# Patient Record
Sex: Female | Born: 1965 | Race: White | Hispanic: No | Marital: Single | Smoking: Never smoker
Health system: Southern US, Community
[De-identification: ages and names within clinical notes are randomized; demographics above are authoritative.]

## PROBLEM LIST (undated history)

## (undated) ENCOUNTER — Encounter

## (undated) ENCOUNTER — Ambulatory Visit: Payer: PRIVATE HEALTH INSURANCE | Attending: Clinical | Primary: Clinical

## (undated) ENCOUNTER — Telehealth

## (undated) ENCOUNTER — Ambulatory Visit

## (undated) ENCOUNTER — Encounter
Attending: Student in an Organized Health Care Education/Training Program | Primary: Student in an Organized Health Care Education/Training Program

## (undated) ENCOUNTER — Ambulatory Visit: Payer: PRIVATE HEALTH INSURANCE

## (undated) ENCOUNTER — Telehealth: Attending: Clinical | Primary: Clinical

## (undated) ENCOUNTER — Encounter
Attending: Rehabilitative and Restorative Service Providers" | Primary: Rehabilitative and Restorative Service Providers"

## (undated) ENCOUNTER — Encounter: Payer: PRIVATE HEALTH INSURANCE | Attending: Rheumatology | Primary: Rheumatology

## (undated) ENCOUNTER — Ambulatory Visit: Attending: Pharmacist | Primary: Pharmacist

## (undated) ENCOUNTER — Encounter
Attending: Pharmacist Clinician (PhC)/ Clinical Pharmacy Specialist | Primary: Pharmacist Clinician (PhC)/ Clinical Pharmacy Specialist

## (undated) ENCOUNTER — Encounter: Attending: Clinical | Primary: Clinical

## (undated) ENCOUNTER — Telehealth
Attending: Student in an Organized Health Care Education/Training Program | Primary: Student in an Organized Health Care Education/Training Program

## (undated) ENCOUNTER — Encounter: Attending: Otolaryngology | Primary: Otolaryngology

## (undated) ENCOUNTER — Ambulatory Visit: Payer: BLUE CROSS/BLUE SHIELD | Attending: Internal Medicine | Primary: Internal Medicine

## (undated) ENCOUNTER — Encounter
Payer: PRIVATE HEALTH INSURANCE | Attending: Pharmacist Clinician (PhC)/ Clinical Pharmacy Specialist | Primary: Pharmacist Clinician (PhC)/ Clinical Pharmacy Specialist

## (undated) ENCOUNTER — Ambulatory Visit: Payer: PRIVATE HEALTH INSURANCE | Attending: Rheumatology | Primary: Rheumatology

## (undated) ENCOUNTER — Encounter: Attending: Pharmacist | Primary: Pharmacist

## (undated) ENCOUNTER — Ambulatory Visit: Payer: BLUE CROSS/BLUE SHIELD

## (undated) ENCOUNTER — Encounter: Attending: Internal Medicine | Primary: Internal Medicine

## (undated) ENCOUNTER — Ambulatory Visit: Payer: PRIVATE HEALTH INSURANCE | Attending: Registered" | Primary: Registered"

## (undated) ENCOUNTER — Encounter: Payer: PRIVATE HEALTH INSURANCE | Attending: Registered" | Primary: Registered"

## (undated) ENCOUNTER — Encounter: Payer: PRIVATE HEALTH INSURANCE | Attending: Otolaryngology | Primary: Otolaryngology

## (undated) ENCOUNTER — Non-Acute Institutional Stay: Payer: PRIVATE HEALTH INSURANCE

## (undated) ENCOUNTER — Telehealth
Attending: Pharmacist Clinician (PhC)/ Clinical Pharmacy Specialist | Primary: Pharmacist Clinician (PhC)/ Clinical Pharmacy Specialist

## (undated) ENCOUNTER — Ambulatory Visit: Payer: BLUE CROSS/BLUE SHIELD | Attending: Physician Assistant | Primary: Physician Assistant

## (undated) ENCOUNTER — Encounter: Payer: PRIVATE HEALTH INSURANCE | Attending: Clinical | Primary: Clinical

## (undated) ENCOUNTER — Ambulatory Visit: Payer: PRIVATE HEALTH INSURANCE | Attending: Internal Medicine | Primary: Internal Medicine

## (undated) ENCOUNTER — Encounter
Payer: PRIVATE HEALTH INSURANCE | Attending: Student in an Organized Health Care Education/Training Program | Primary: Student in an Organized Health Care Education/Training Program

## (undated) ENCOUNTER — Ambulatory Visit
Payer: PRIVATE HEALTH INSURANCE | Attending: Student in an Organized Health Care Education/Training Program | Primary: Student in an Organized Health Care Education/Training Program

## (undated) ENCOUNTER — Ambulatory Visit: Payer: Medicaid (Managed Care)

## (undated) ENCOUNTER — Ambulatory Visit
Payer: PRIVATE HEALTH INSURANCE | Attending: Pharmacist Clinician (PhC)/ Clinical Pharmacy Specialist | Primary: Pharmacist Clinician (PhC)/ Clinical Pharmacy Specialist

## (undated) DIAGNOSIS — E119 Type 2 diabetes mellitus without complications: Secondary | ICD-10-CM

## (undated) DIAGNOSIS — F329 Major depressive disorder, single episode, unspecified: Secondary | ICD-10-CM

## (undated) DIAGNOSIS — M1990 Unspecified osteoarthritis, unspecified site: Secondary | ICD-10-CM

## (undated) DIAGNOSIS — L4050 Arthropathic psoriasis, unspecified: Secondary | ICD-10-CM

## (undated) DIAGNOSIS — F32A Depression, unspecified: Secondary | ICD-10-CM

## (undated) DIAGNOSIS — E039 Hypothyroidism, unspecified: Secondary | ICD-10-CM

## (undated) DIAGNOSIS — I10 Essential (primary) hypertension: Secondary | ICD-10-CM

## (undated) MED ORDER — CHOLECALCIFEROL (VITAMIN D3) 50 MCG (2,000 UNIT) CAPSULE: ORAL | 0.00000 days

## (undated) MED ORDER — BLOOD-GLUCOSE TRANSMITTER-BLOOD-GLUCOSE SENSOR
0.00000 days
Start: ? — End: 2019-12-30

---
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
## 2006-12-01 ENCOUNTER — Emergency Department: Payer: Self-pay | Admitting: Emergency Medicine

## 2006-12-21 ENCOUNTER — Ambulatory Visit: Payer: Self-pay | Admitting: Urology

## 2012-12-25 ENCOUNTER — Emergency Department: Payer: Self-pay | Admitting: Emergency Medicine

## 2012-12-25 LAB — COMPREHENSIVE METABOLIC PANEL
Albumin: 4 g/dL (ref 3.4–5.0)
Alkaline Phosphatase: 162 U/L — ABNORMAL HIGH (ref 50–136)
BUN: 13 mg/dL (ref 7–18)
Bilirubin,Total: 0.5 mg/dL (ref 0.2–1.0)
Calcium, Total: 9 mg/dL (ref 8.5–10.1)
Chloride: 98 mmol/L (ref 98–107)
Co2: 29 mmol/L (ref 21–32)
EGFR (African American): >60
EGFR (Non-African Amer.): >60
Osmolality: 277 (ref 275–301)
Potassium: 3.9 mmol/L (ref 3.5–5.1)
SGOT(AST): 53 U/L — ABNORMAL HIGH (ref 15–37)
SGPT (ALT): 99 U/L — ABNORMAL HIGH (ref 12–78)
Total Protein: 7.9 g/dL (ref 6.4–8.2)

## 2012-12-25 LAB — CBC
HCT: 43.8 % (ref 35.0–47.0)
HGB: 15.3 g/dL (ref 12.0–16.0)
MCHC: 34.9 g/dL (ref 32.0–36.0)
MCV: 83 fL (ref 80–100)
Platelet: 186 10*3/uL (ref 150–440)
RBC: 5.28 10*6/uL — ABNORMAL HIGH (ref 3.80–5.20)
RDW: 12.6 % (ref 11.5–14.5)
WBC: 9.2 10*3/uL (ref 3.6–11.0)

## 2012-12-25 LAB — URINALYSIS, COMPLETE
Bilirubin,UR: NEGATIVE
Blood: NEGATIVE
Glucose,UR: >=500 mg/dL (ref 0–75)
Leukocyte Esterase: NEGATIVE
RBC,UR: 1 /HPF (ref 0–5)
Squamous Epithelial: <1
WBC UR: NONE SEEN /HPF (ref 0–5)

## 2012-12-25 LAB — TROPONIN I: Troponin-I: <0.02 ng/mL

## 2013-12-28 ENCOUNTER — Emergency Department: Payer: Self-pay | Admitting: Emergency Medicine

## 2013-12-28 LAB — COMPREHENSIVE METABOLIC PANEL
ALT: 37 U/L
ANION GAP: 10 (ref 7–16)
Albumin: 4 g/dL (ref 3.4–5.0)
Alkaline Phosphatase: 58 U/L
BUN: 27 mg/dL — AB (ref 7–18)
Bilirubin,Total: 0.2 mg/dL (ref 0.2–1.0)
CO2: 27 mmol/L (ref 21–32)
Calcium, Total: 9.3 mg/dL (ref 8.5–10.1)
Chloride: 99 mmol/L (ref 98–107)
Creatinine: 0.59 mg/dL — ABNORMAL LOW (ref 0.60–1.30)
Glucose: 170 mg/dL — ABNORMAL HIGH (ref 65–99)
OSMOLALITY: 281 (ref 275–301)
POTASSIUM: 3.3 mmol/L — AB (ref 3.5–5.1)
SGOT(AST): 24 U/L (ref 15–37)
Sodium: 136 mmol/L (ref 136–145)
TOTAL PROTEIN: 7.8 g/dL (ref 6.4–8.2)

## 2013-12-28 LAB — URINALYSIS, COMPLETE
BLOOD: NEGATIVE
Bacteria: NONE SEEN
Bilirubin,UR: NEGATIVE
Glucose,UR: NEGATIVE mg/dL (ref 0–75)
KETONE: NEGATIVE
NITRITE: NEGATIVE
PH: 6 (ref 4.5–8.0)
Protein: 30
RBC,UR: 12 /HPF (ref 0–5)
SQUAMOUS EPITHELIAL: NONE SEEN
Specific Gravity: 1.015 (ref 1.003–1.030)
WBC UR: <1 /HPF (ref 0–5)

## 2013-12-28 LAB — CBC WITH DIFFERENTIAL/PLATELET
BASOS ABS: 0.1 10*3/uL (ref 0.0–0.1)
BASOS PCT: 1 %
EOS ABS: 0.2 10*3/uL (ref 0.0–0.7)
Eosinophil %: 1.4 %
HCT: 40.6 % (ref 35.0–47.0)
HGB: 13.5 g/dL (ref 12.0–16.0)
Lymphocyte #: 5 10*3/uL — ABNORMAL HIGH (ref 1.0–3.6)
Lymphocyte %: 41.8 %
MCH: 28.8 pg (ref 26.0–34.0)
MCHC: 33.4 g/dL (ref 32.0–36.0)
MCV: 86 fL (ref 80–100)
Monocyte #: 0.8 x10 3/mm (ref 0.2–0.9)
Monocyte %: 6.7 %
Neutrophil #: 5.9 10*3/uL (ref 1.4–6.5)
Neutrophil %: 49.1 %
PLATELETS: 259 10*3/uL (ref 150–440)
RBC: 4.7 10*6/uL (ref 3.80–5.20)
RDW: 12.8 % (ref 11.5–14.5)
WBC: 12 10*3/uL — ABNORMAL HIGH (ref 3.6–11.0)

## 2014-02-24 ENCOUNTER — Ambulatory Visit: Payer: Self-pay

## 2014-03-04 ENCOUNTER — Ambulatory Visit: Payer: Self-pay | Admitting: Urology

## 2014-03-04 LAB — BASIC METABOLIC PANEL
Anion Gap: 5 — ABNORMAL LOW (ref 7–16)
BUN: 17 mg/dL (ref 7–18)
CO2: 30 mmol/L (ref 21–32)
Calcium, Total: 9.3 mg/dL (ref 8.5–10.1)
Chloride: 100 mmol/L (ref 98–107)
Creatinine: 0.61 mg/dL (ref 0.60–1.30)
EGFR (African American): >60
EGFR (Non-African Amer.): >60
Glucose: 189 mg/dL — ABNORMAL HIGH (ref 65–99)
Osmolality: 277 (ref 275–301)
Potassium: 3.8 mmol/L (ref 3.5–5.1)
Sodium: 135 mmol/L — ABNORMAL LOW (ref 136–145)

## 2014-03-04 LAB — URINALYSIS, COMPLETE
BILIRUBIN, UR: NEGATIVE
Blood: NEGATIVE
Glucose,UR: NEGATIVE mg/dL (ref 0–75)
Ketone: NEGATIVE
Leukocyte Esterase: NEGATIVE
Nitrite: NEGATIVE
PROTEIN: NEGATIVE
Ph: 6 (ref 4.5–8.0)
RBC,UR: 3 /HPF (ref 0–5)
Specific Gravity: 1.025 (ref 1.003–1.030)
Squamous Epithelial: 13
WBC UR: 1 /HPF (ref 0–5)

## 2014-03-05 LAB — URINE CULTURE

## 2014-03-10 ENCOUNTER — Ambulatory Visit: Payer: Self-pay | Admitting: Urology

## 2014-04-02 ENCOUNTER — Ambulatory Visit: Payer: Self-pay | Admitting: Urology

## 2014-08-09 NOTE — Op Note (Signed)
PATIENT NAME:  Krystal Nelson, Krystal Nelson MR#:  [id number] DATE OF BIRTH:  1966/03/02  DATE OF PROCEDURE:  03/10/2014  PREOPERATIVE DIAGNOSIS: Left ureterovesical junction stone.  POSTOPERATIVE DIAGNOSIS: Left ureterovesical junction stone.   PROCEDURE PERFORMED: Left ureteroscopy, laser lithotripsy, left ureteral stent placement on a string.   ATTENDING SURGEON: Claris Gladden, MD   ANESTHESIA: General anesthesia.   ESTIMATED BLOOD LOSS: Minimal.   DRAINS: A 6 x 24 French double-J ureteral stent on the left on a string.   SPECIMEN: Stone fragment.   COMPLICATIONS: None.   INDICATION: This is a 49 year old female with recurrent UTIs who underwent a CT scan indicating a 6 mm left distal ureteral stone. There was some suggestion that this may represent a ureterocele. She failed to pass it on her own, and it has been persistent for some time. She was counseled on her various treatment options, and has elected to undergo left ureteroscopy, laser lithotripsy, left ureteral stent placement. Risks and benefits of the procedure were explained in detail. The patient agreed to proceed as planned.   PROCEDURE IN DETAIL: The patient was correctly identified in the preoperative holding area and informed consent was confirmed. She was brought to the operating suite and placed on the table in the supine position. At this time a universal timeout protocol was performed. All team members were identified. Venodyne boots were placed, and she was administered IV Levaquin in the perioperative period. She was then placed under general anesthesia and repositioned lower on the bed in the dorsal lithotomy position and prepped and draped in standard surgical fashion.   A rigid cystoscope using a 22 French access sheath was advanced per urethra into the bladder, and attention was turned to the left ureteral orifice. This was noted to be heaped up, with a slightly patent orifice, and the stone could be seen ball-valving within  the ureteral orifice. A 5 French open-ended ureteral catheter was advanced just within the os of the UO and a retrograde pyelogram was performed. This revealed a bulb-like dilation of the left distal ureter, without evidence of clear ureterocele, with a filling defect consistent with the stone. The proximal ureter was fairly decompressed, with a normal-appearing collecting system. A Sensor wire was placed up the level of the kidney, under fluoroscopic guidance, and the scope was removed. This wire was snapped in placed and used for a safety wire. A rigid 7 French rigid ureteroscope was then advanced per urethra, just within the left ureteral orifice without difficulty, and a 365 micron laser fiber was used using settings of  0.2 Joules and 50 Hz to dust this stone into small fragments; the majority of these fragments were irrigated out. The scope was then passed more proximally, up to the level of the proximal ureter, and no stone fragments were seen at this location. The scope was then slowly backed down the ureter, and some small fragments within the distal ureter were removed serially using a 3 French tipless Nitinol basket. Once the ureter was noted to be completely clear of all stone and stone fragment, the decision was made to go ahead and proceed with left ureteral stent placement as a precaution.   The stent was advanced over the wire under fluoroscopic guidance, up to the level of the kidney, and the wire was then partially withdrawn and a half coil was noted within the kidney. The wire was then fully withdrawn and a full coil was noted within the bladder. The bladder was drained using the sheath  of the cystoscope. The stent string was then secured to the patient's right inner thigh using Mastisol and Tegaderm.   The patient was repositioned in the supine position, reversed from anesthesia after being extubated and taken to PACU in stable condition. There were no complications in this case.     ____________________________ Claris GladdenAshley J. Ritvik Mczeal, MD ajb:MT D: 03/10/2014 12:17:52 ET T: 03/10/2014 12:49:29 ET JOB#: [id number]  cc: Claris GladdenAshley J. Saraia Platner, MD, <Dictator> Claris GladdenASHLEY J Shloima Clinch MD ELECTRONICALLY SIGNED 04/02/2014 10:49

## 2015-06-21 IMAGING — US US RENAL KIDNEY
1 series · 14 of 25 positions shown · non-contrast
Comparison: 02/24/2014

CLINICAL DATA: Renal calculi, postoperative

EXAM:
RENAL/URINARY TRACT ULTRASOUND COMPLETE

[Series 1: us renal kidney · 0.26mm/px · 14 of 30 slices shown]
[im 1/30]
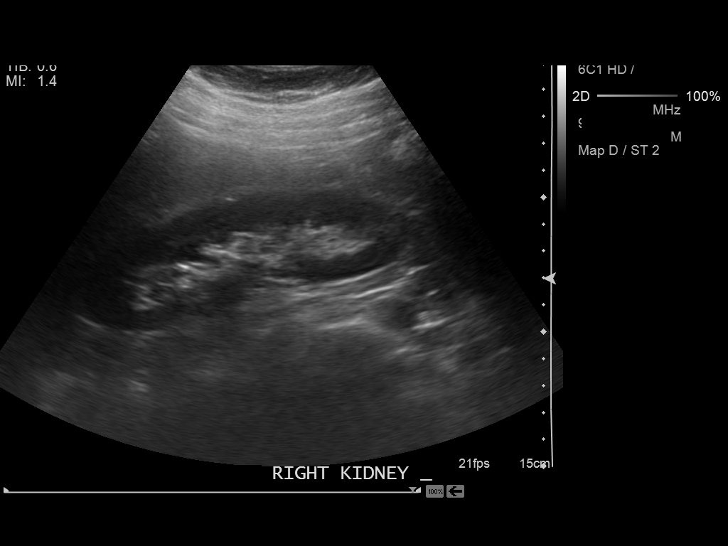
[im 3/30]
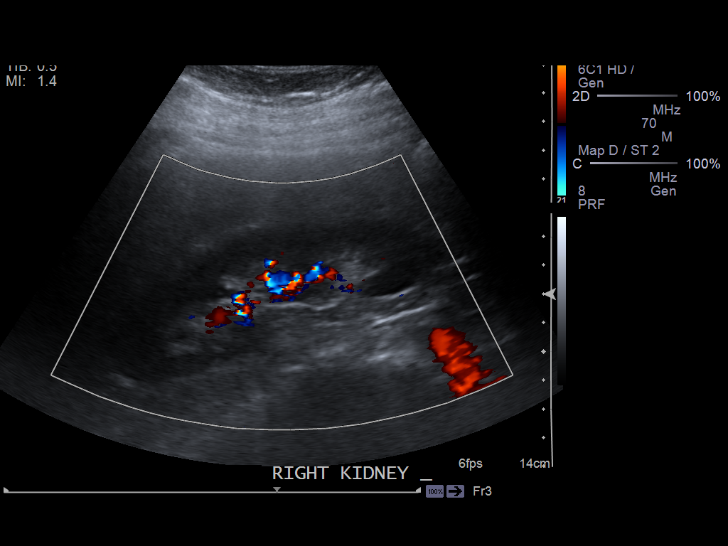
[im 5/30]
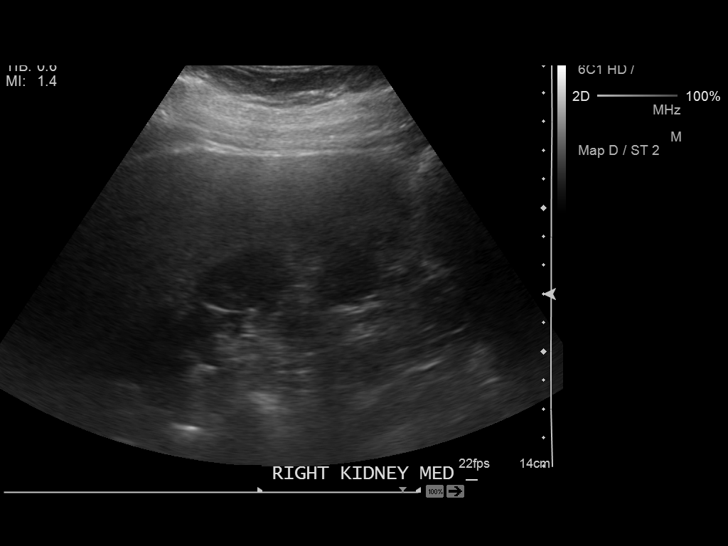
[im 8/30]
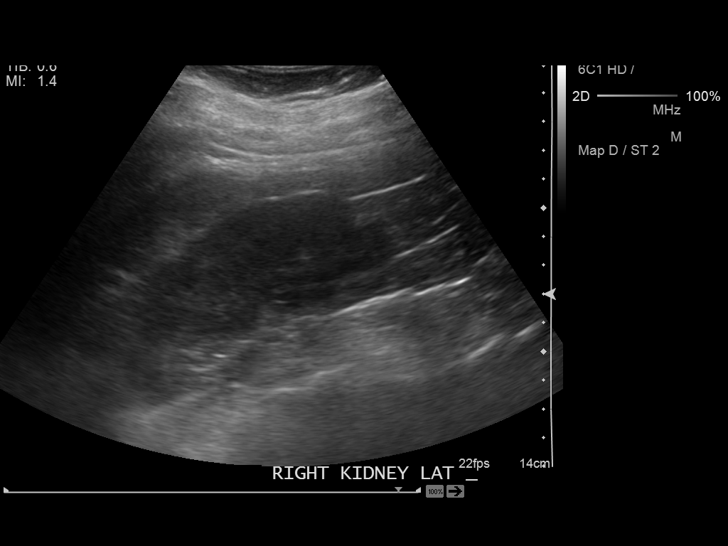
[im 10/30]
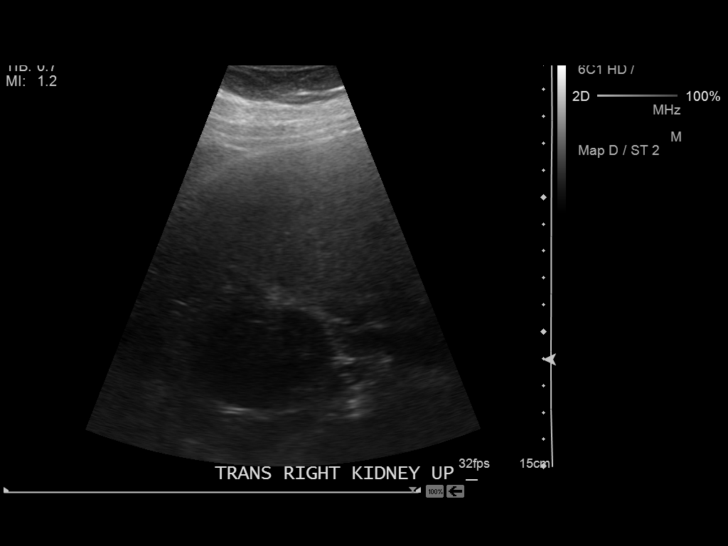
[im 11/30]
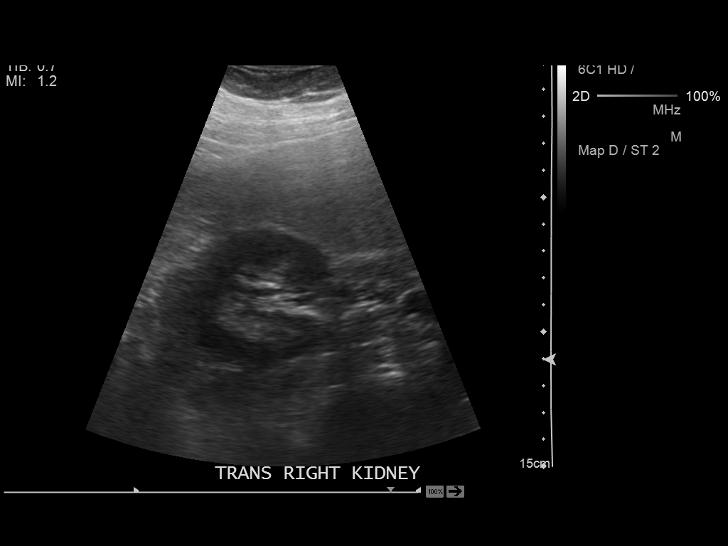
[im 14/30]
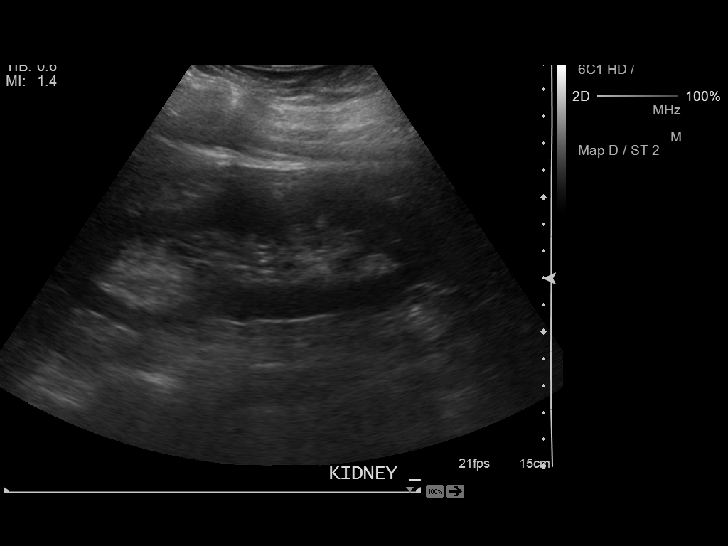
[im 16/30]
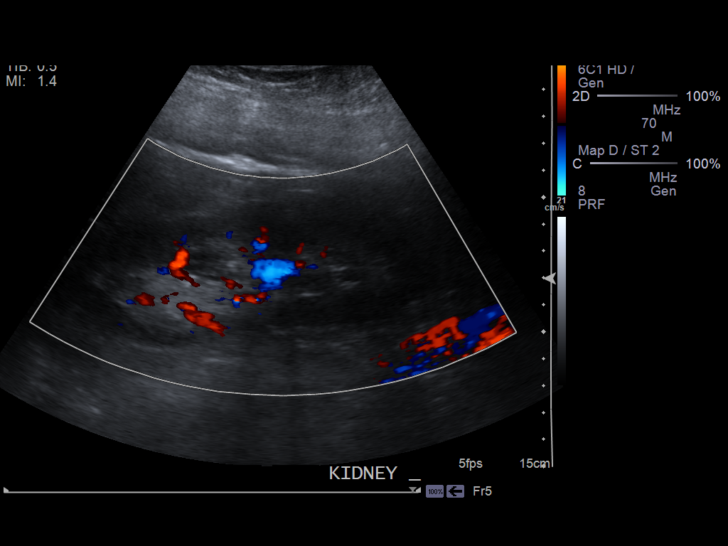
[im 19/30]
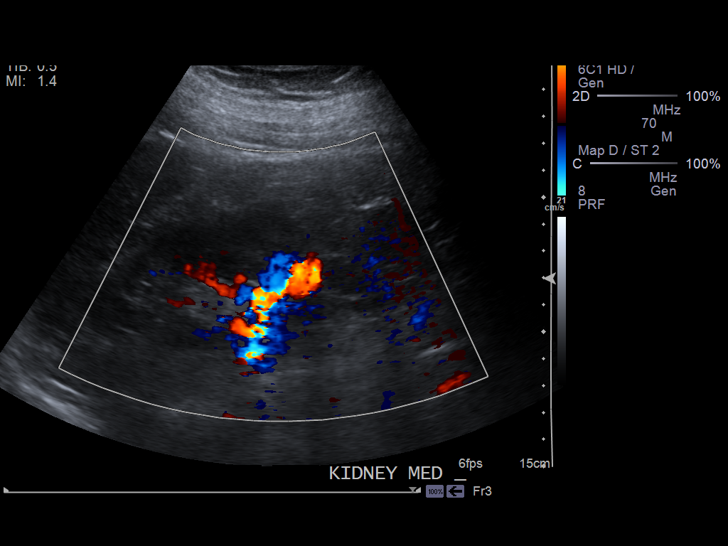
[im 20/30]
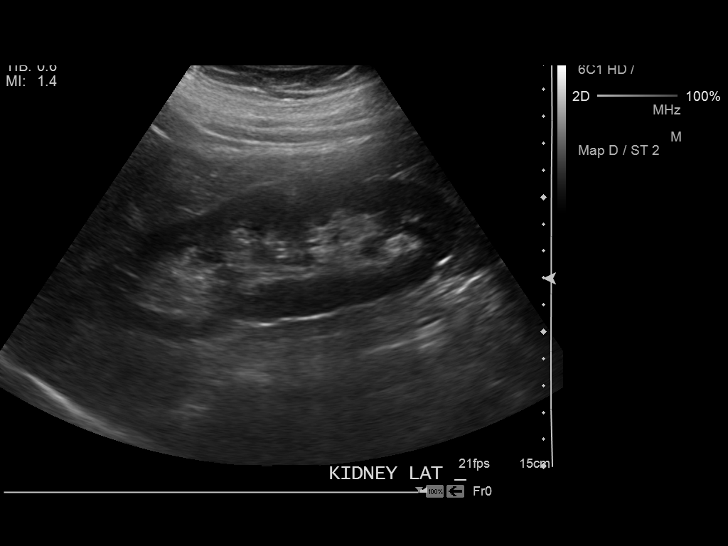
[im 22/30]
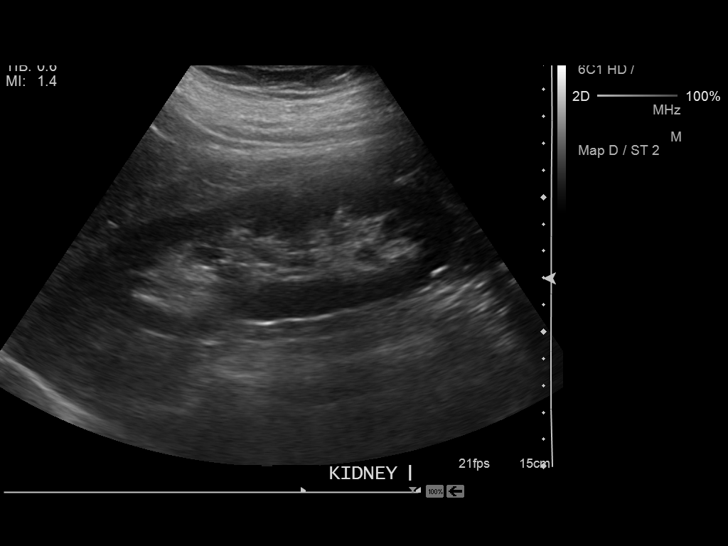
[im 25/30]
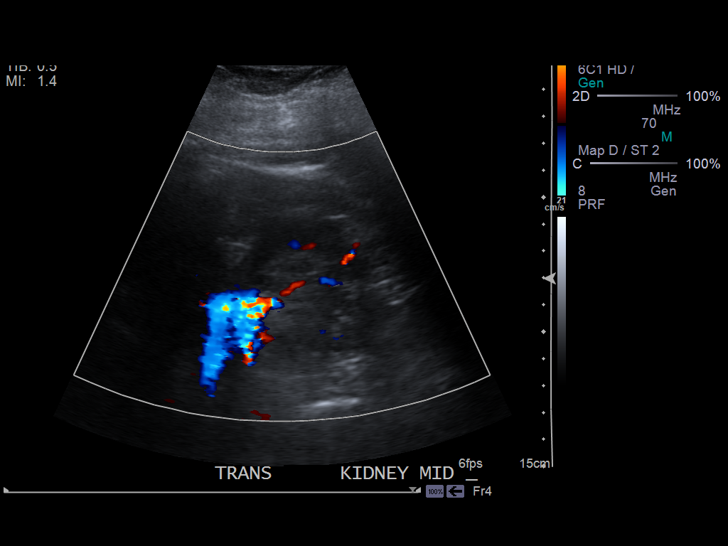
[im 27/30]
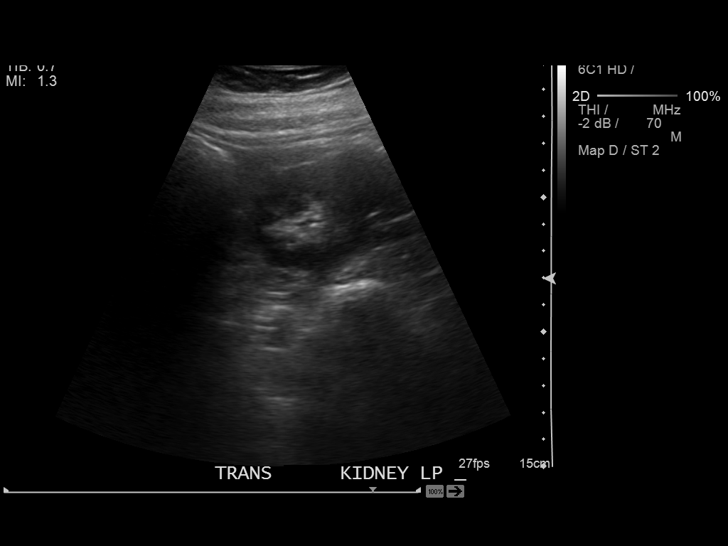
[im 30/30]
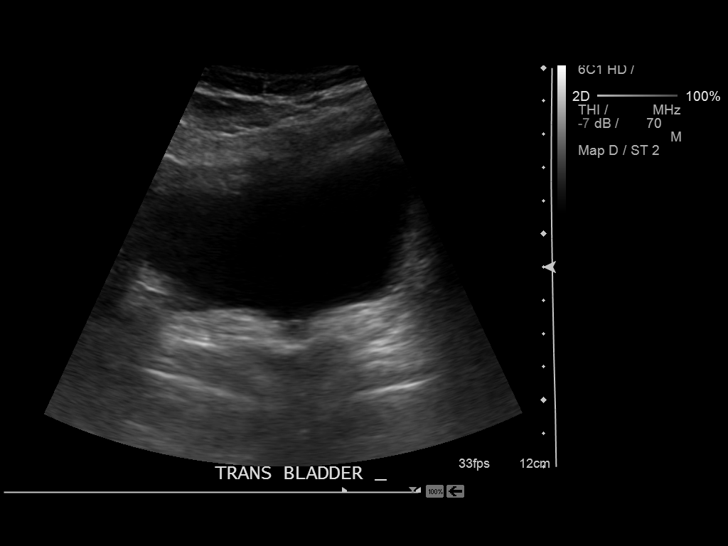

[14 of 25 positions shown; findings below may reference images not displayed]

FINDINGS: Right Kidney:

Length: 12.5 cm. Echogenicity within normal limits. No mass or
hydronephrosis visualized.

Left Kidney:

Length: 14.2 cm. Echogenicity within normal limits. No mass or
hydronephrosis visualized.

Bladder:

Appears normal for degree of bladder distention. Previously seen
bladder stone is no longer visible.
IMPRESSION: 1. Previously seen left eccentric bladder calculus is no longer
visible. No hydronephrosis or hydroureter.
2. Prominent size of the left kidney, likely related to the
patient's known duplicated collecting system.

## 2016-11-07 ENCOUNTER — Ambulatory Visit: Admission: RE | Admit: 2016-11-07 | Discharge: 2016-11-07 | Disposition: A | Discharge status: Home / Self Care

## 2016-11-07 ENCOUNTER — Ambulatory Visit
Admission: RE | Admit: 2016-11-07 | Discharge: 2016-11-07 | Disposition: A | Discharge status: Home / Self Care | Payer: Commercial Managed Care - PPO

## 2016-11-07 DIAGNOSIS — R809 Proteinuria, unspecified: Secondary | ICD-10-CM

## 2016-11-07 DIAGNOSIS — Z794 Long term (current) use of insulin: Secondary | ICD-10-CM

## 2016-11-07 DIAGNOSIS — E1129 Type 2 diabetes mellitus with other diabetic kidney complication: Principal | ICD-10-CM

## 2016-11-07 DIAGNOSIS — I10 Essential (primary) hypertension: Principal | ICD-10-CM

## 2016-11-07 DIAGNOSIS — J069 Acute upper respiratory infection, unspecified: Secondary | ICD-10-CM

## 2016-11-07 DIAGNOSIS — F329 Major depressive disorder, single episode, unspecified: Secondary | ICD-10-CM

## 2016-11-07 MED ORDER — LEVOTHYROXINE 50 MCG TABLET
ORAL_TABLET | Freq: Every day | ORAL | 3 refills | 0 days | Status: CP
Start: 2016-11-07 — End: 2016-11-14

## 2016-11-07 MED ORDER — BUPROPION HCL XL 150 MG 24 HR TABLET, EXTENDED RELEASE
ORAL_TABLET | Freq: Every morning | ORAL | 3 refills | 0.00000 days | Status: CP
Start: 2016-11-07 — End: 2017-11-07

## 2016-11-07 MED ORDER — OMEPRAZOLE 20 MG CAPSULE,DELAYED RELEASE
ORAL_CAPSULE | Freq: Every day | ORAL | 3 refills | 0 days | Status: CP
Start: 2016-11-07 — End: 2018-06-20

## 2016-11-07 MED ORDER — METFORMIN ER 1,000 MG TABLET,EXTENDED RELEASE 24HR
ORAL_TABLET | Freq: Two times a day (BID) | ORAL | 3 refills | 0 days | Status: CP
Start: 2016-11-07 — End: 2017-04-26

## 2016-11-07 MED ORDER — PRAVASTATIN 40 MG TABLET
ORAL_TABLET | Freq: Every day | ORAL | 3 refills | 0.00000 days | Status: CP
Start: 2016-11-07 — End: 2018-06-20

## 2016-11-07 MED ORDER — LISINOPRIL 20 MG TABLET
ORAL_TABLET | Freq: Every day | ORAL | 3 refills | 0 days | Status: CP
Start: 2016-11-07 — End: 2017-11-22

## 2016-11-07 MED ORDER — CODEINE 10 MG-GUAIFENESIN 100 MG/5 ML ORAL LIQUID
Freq: Every evening | ORAL | 0 refills | 0.00000 days | Status: CP | PRN
Start: 2016-11-07 — End: 2016-11-12

## 2016-11-07 MED ORDER — ALBUTEROL SULFATE 2.5 MG/3 ML (0.083 %) SOLUTION FOR NEBULIZATION
RESPIRATORY_TRACT | 2 refills | 0.00000 days | Status: CP | PRN
Start: 2016-11-07 — End: 2017-11-17

## 2016-11-14 ENCOUNTER — Ambulatory Visit: Admission: RE | Admit: 2016-11-14 | Discharge: 2016-11-14

## 2016-11-14 DIAGNOSIS — F329 Major depressive disorder, single episode, unspecified: Principal | ICD-10-CM

## 2016-11-14 DIAGNOSIS — I10 Essential (primary) hypertension: Secondary | ICD-10-CM

## 2016-11-14 DIAGNOSIS — J40 Bronchitis, not specified as acute or chronic: Secondary | ICD-10-CM

## 2016-11-14 MED ORDER — LEVOTHYROXINE 75 MCG TABLET
ORAL_TABLET | Freq: Every day | ORAL | 4 refills | 0.00000 days | Status: CP
Start: 2016-11-14 — End: 2018-02-15

## 2017-01-09 ENCOUNTER — Ambulatory Visit
Admission: RE | Admit: 2017-01-09 | Discharge: 2017-01-09 | Disposition: A | Discharge status: Home / Self Care | Payer: Commercial Managed Care - PPO | Attending: Audiologist | Admitting: Audiologist

## 2017-01-09 ENCOUNTER — Ambulatory Visit
Admission: RE | Admit: 2017-01-09 | Discharge: 2017-01-09 | Disposition: A | Discharge status: Home / Self Care | Payer: Commercial Managed Care - PPO | Attending: Otolaryngology | Admitting: Otolaryngology

## 2017-01-09 ENCOUNTER — Ambulatory Visit
Admission: RE | Admit: 2017-01-09 | Discharge: 2017-01-09 | Disposition: A | Discharge status: Home / Self Care | Payer: Commercial Managed Care - PPO

## 2017-01-09 DIAGNOSIS — H9193 Unspecified hearing loss, bilateral: Principal | ICD-10-CM

## 2017-01-09 DIAGNOSIS — R809 Proteinuria, unspecified: Secondary | ICD-10-CM

## 2017-01-09 DIAGNOSIS — E1129 Type 2 diabetes mellitus with other diabetic kidney complication: Secondary | ICD-10-CM

## 2017-01-09 DIAGNOSIS — I10 Essential (primary) hypertension: Secondary | ICD-10-CM

## 2017-01-09 DIAGNOSIS — H903 Sensorineural hearing loss, bilateral: Secondary | ICD-10-CM

## 2017-01-09 DIAGNOSIS — H7192 Unspecified cholesteatoma, left ear: Principal | ICD-10-CM

## 2017-01-09 DIAGNOSIS — E119 Type 2 diabetes mellitus without complications: Principal | ICD-10-CM

## 2017-01-09 DIAGNOSIS — Z794 Long term (current) use of insulin: Secondary | ICD-10-CM

## 2017-01-09 MED ORDER — INSULIN GLARGINE (U-100) 100 UNIT/ML SUBCUTANEOUS SOLUTION
Freq: Every evening | SUBCUTANEOUS | 3 refills | 0 days
Start: 2017-01-09 — End: 2017-04-26

## 2017-01-09 MED ORDER — FLUTICASONE PROPIONATE 50 MCG/ACTUATION NASAL SPRAY,SUSPENSION
Freq: Every day | NASAL | 0 refills | 0.00000 days | Status: CP
Start: 2017-01-09 — End: 2018-06-20

## 2017-02-02 ENCOUNTER — Ambulatory Visit: Admission: RE | Admit: 2017-02-02 | Discharge: 2017-02-02 | Payer: Commercial Managed Care - PPO

## 2017-02-02 ENCOUNTER — Ambulatory Visit
Admission: RE | Admit: 2017-02-02 | Discharge: 2017-02-02 | Payer: Commercial Managed Care - PPO | Attending: Otolaryngology | Admitting: Otolaryngology

## 2017-02-02 DIAGNOSIS — H903 Sensorineural hearing loss, bilateral: Principal | ICD-10-CM

## 2017-04-19 MED ORDER — SECUKINUMAB 150 MG/ML SUBCUTANEOUS PEN INJECTOR
INJECTION | SUBCUTANEOUS | 3 refills | 0 days | Status: CP
Start: 2017-04-19 — End: 2017-04-24

## 2017-04-24 MED ORDER — SECUKINUMAB 150 MG/ML SUBCUTANEOUS PEN INJECTOR
INJECTION | SUBCUTANEOUS | 3 refills | 0 days | Status: CP
Start: 2017-04-24 — End: 2018-06-20

## 2017-04-26 ENCOUNTER — Encounter
Admit: 2017-04-26 | Discharge: 2017-04-27 | Payer: PRIVATE HEALTH INSURANCE | Attending: Internal Medicine | Primary: Internal Medicine

## 2017-04-26 DIAGNOSIS — R809 Proteinuria, unspecified: Secondary | ICD-10-CM

## 2017-04-26 DIAGNOSIS — E1129 Type 2 diabetes mellitus with other diabetic kidney complication: Principal | ICD-10-CM

## 2017-04-26 DIAGNOSIS — Z794 Long term (current) use of insulin: Secondary | ICD-10-CM

## 2017-04-26 DIAGNOSIS — B379 Candidiasis, unspecified: Secondary | ICD-10-CM

## 2017-04-26 MED ORDER — INSULIN GLARGINE (U-100) 100 UNIT/ML SUBCUTANEOUS SOLUTION
Freq: Every evening | SUBCUTANEOUS | 3 refills | 0.00000 days | Status: CP
Start: 2017-04-26 — End: 2017-05-10

## 2017-04-26 MED ORDER — FLUCONAZOLE 150 MG TABLET
ORAL_TABLET | Freq: Every day | ORAL | 3 refills | 0.00000 days | Status: CP | PRN
Start: 2017-04-26 — End: 2018-06-20

## 2017-04-26 MED ORDER — GLIPIZIDE 5 MG TABLET
ORAL_TABLET | Freq: Two times a day (BID) | ORAL | 11 refills | 0.00000 days | Status: CP
Start: 2017-04-26 — End: 2018-04-27

## 2017-05-10 ENCOUNTER — Encounter: Admit: 2017-05-10 | Discharge: 2017-05-11 | Payer: PRIVATE HEALTH INSURANCE

## 2017-05-10 DIAGNOSIS — I10 Essential (primary) hypertension: Secondary | ICD-10-CM

## 2017-05-10 DIAGNOSIS — Z794 Long term (current) use of insulin: Secondary | ICD-10-CM

## 2017-05-10 DIAGNOSIS — E1129 Type 2 diabetes mellitus with other diabetic kidney complication: Principal | ICD-10-CM

## 2017-05-10 DIAGNOSIS — R809 Proteinuria, unspecified: Secondary | ICD-10-CM

## 2017-05-10 MED ORDER — INSULIN GLARGINE (U-100) 100 UNIT/ML SUBCUTANEOUS SOLUTION
prn refills | 0 days | Status: CP
Start: 2017-05-10 — End: 2018-05-17

## 2017-05-24 MED ORDER — BLOOD SUGAR DIAGNOSTIC STRIPS
ORAL_STRIP | 11 refills | 0 days | Status: CP
Start: 2017-05-24 — End: ?

## 2017-05-24 MED ORDER — LANCETS
11 refills | 0 days | Status: CP
Start: 2017-05-24 — End: 2018-07-31

## 2017-06-12 MED ORDER — INSULIN SYRINGE U-100 WITH NEEDLE 0.5 ML 31 GAUGE X 5/16" (8 MM)
11 refills | 0 days | Status: CP
Start: 2017-06-12 — End: 2018-07-31

## 2017-07-19 MED ORDER — FLUOXETINE 40 MG CAPSULE
ORAL_CAPSULE | Freq: Every day | ORAL | 3 refills | 0 days | Status: CP
Start: 2017-07-19 — End: 2018-09-28

## 2017-11-17 ENCOUNTER — Ambulatory Visit: Admit: 2017-11-17 | Discharge: 2017-11-17 | Payer: PRIVATE HEALTH INSURANCE

## 2017-11-17 ENCOUNTER — Encounter
Admit: 2017-11-17 | Discharge: 2017-11-17 | Payer: PRIVATE HEALTH INSURANCE | Attending: Student in an Organized Health Care Education/Training Program | Primary: Student in an Organized Health Care Education/Training Program

## 2017-11-17 DIAGNOSIS — Z794 Long term (current) use of insulin: Secondary | ICD-10-CM

## 2017-11-17 DIAGNOSIS — E039 Hypothyroidism, unspecified: Secondary | ICD-10-CM

## 2017-11-17 DIAGNOSIS — E119 Type 2 diabetes mellitus without complications: Principal | ICD-10-CM

## 2017-11-17 DIAGNOSIS — F329 Major depressive disorder, single episode, unspecified: Secondary | ICD-10-CM

## 2017-11-17 DIAGNOSIS — I10 Essential (primary) hypertension: Secondary | ICD-10-CM

## 2017-11-17 DIAGNOSIS — E049 Nontoxic goiter, unspecified: Secondary | ICD-10-CM

## 2017-11-17 DIAGNOSIS — N3001 Acute cystitis with hematuria: Secondary | ICD-10-CM

## 2017-11-17 DIAGNOSIS — R300 Dysuria: Secondary | ICD-10-CM

## 2017-11-17 DIAGNOSIS — Z1231 Encounter for screening mammogram for malignant neoplasm of breast: Secondary | ICD-10-CM

## 2017-11-18 MED ORDER — NITROFURANTOIN MONOHYDRATE/MACROCRYSTALS 100 MG CAPSULE
ORAL_CAPSULE | Freq: Two times a day (BID) | ORAL | 0 refills | 0 days | Status: CP
Start: 2017-11-18 — End: 2017-11-23

## 2017-11-19 MED ORDER — SULFAMETHOXAZOLE 800 MG-TRIMETHOPRIM 160 MG TABLET
ORAL_TABLET | Freq: Two times a day (BID) | ORAL | 0 refills | 0 days | Status: CP
Start: 2017-11-19 — End: 2017-11-22

## 2017-11-22 MED ORDER — LISINOPRIL 20 MG TABLET
ORAL_TABLET | Freq: Every day | ORAL | 3 refills | 0.00000 days | Status: CP
Start: 2017-11-22 — End: 2018-11-22

## 2017-11-23 ENCOUNTER — Encounter: Admit: 2017-11-23 | Discharge: 2017-11-24 | Payer: PRIVATE HEALTH INSURANCE

## 2017-11-23 DIAGNOSIS — E039 Hypothyroidism, unspecified: Principal | ICD-10-CM

## 2017-11-23 DIAGNOSIS — E049 Nontoxic goiter, unspecified: Secondary | ICD-10-CM

## 2017-12-12 ENCOUNTER — Encounter: Admit: 2017-12-12 | Discharge: 2017-12-13 | Payer: PRIVATE HEALTH INSURANCE

## 2017-12-12 DIAGNOSIS — Z1231 Encounter for screening mammogram for malignant neoplasm of breast: Principal | ICD-10-CM

## 2018-02-15 MED ORDER — LEVOTHYROXINE 75 MCG TABLET
ORAL_TABLET | Freq: Every day | ORAL | 2 refills | 0 days | Status: CP
Start: 2018-02-15 — End: ?

## 2018-04-27 ENCOUNTER — Encounter: Admit: 2018-04-27 | Discharge: 2018-04-28 | Payer: PRIVATE HEALTH INSURANCE

## 2018-04-27 DIAGNOSIS — J069 Acute upper respiratory infection, unspecified: Principal | ICD-10-CM

## 2018-04-27 MED ORDER — GLIPIZIDE 5 MG TABLET
ORAL_TABLET | 11 refills | 0 days | Status: CP
Start: 2018-04-27 — End: ?

## 2018-05-18 MED ORDER — LANTUS U-100 INSULIN 100 UNIT/ML SUBCUTANEOUS SOLUTION
prn refills | 0 days | Status: CP
Start: 2018-05-18 — End: ?

## 2018-06-20 ENCOUNTER — Encounter: Admit: 2018-06-20 | Discharge: 2018-06-20 | Payer: PRIVATE HEALTH INSURANCE

## 2018-06-20 ENCOUNTER — Ambulatory Visit: Admit: 2018-06-20 | Discharge: 2018-06-20 | Payer: PRIVATE HEALTH INSURANCE

## 2018-06-20 DIAGNOSIS — E119 Type 2 diabetes mellitus without complications: Principal | ICD-10-CM

## 2018-06-20 DIAGNOSIS — Z87442 Personal history of urinary calculi: Principal | ICD-10-CM

## 2018-06-20 DIAGNOSIS — H7291 Unspecified perforation of tympanic membrane, right ear: Principal | ICD-10-CM

## 2018-06-20 DIAGNOSIS — Z79899 Other long term (current) drug therapy: Principal | ICD-10-CM

## 2018-06-20 DIAGNOSIS — H6693 Otitis media, unspecified, bilateral: Principal | ICD-10-CM

## 2018-06-20 DIAGNOSIS — M85842 Other specified disorders of bone density and structure, left hand: Principal | ICD-10-CM

## 2018-06-20 DIAGNOSIS — L4050 Arthropathic psoriasis, unspecified: Principal | ICD-10-CM

## 2018-06-20 DIAGNOSIS — E039 Hypothyroidism, unspecified: Principal | ICD-10-CM

## 2018-06-20 DIAGNOSIS — I10 Essential (primary) hypertension: Principal | ICD-10-CM

## 2018-06-20 DIAGNOSIS — H9201 Otalgia, right ear: Principal | ICD-10-CM

## 2018-06-20 DIAGNOSIS — N200 Calculus of kidney: Principal | ICD-10-CM

## 2018-06-20 DIAGNOSIS — M25561 Pain in right knee: Principal | ICD-10-CM

## 2018-06-20 DIAGNOSIS — H9191 Unspecified hearing loss, right ear: Principal | ICD-10-CM

## 2018-06-20 DIAGNOSIS — M25562 Pain in left knee: Principal | ICD-10-CM

## 2018-06-20 DIAGNOSIS — E559 Vitamin D deficiency, unspecified: Principal | ICD-10-CM

## 2018-06-20 DIAGNOSIS — M85841 Other specified disorders of bone density and structure, right hand: Principal | ICD-10-CM

## 2018-06-20 DIAGNOSIS — L409 Psoriasis, unspecified: Principal | ICD-10-CM

## 2018-06-20 DIAGNOSIS — Z87440 Personal history of urinary (tract) infections: Principal | ICD-10-CM

## 2018-06-20 DIAGNOSIS — H7293 Unspecified perforation of tympanic membrane, bilateral: Secondary | ICD-10-CM

## 2018-06-20 MED ORDER — SECUKINUMAB 150 MG/ML SUBCUTANEOUS PEN INJECTOR
INJECTION | SUBCUTANEOUS | 3 refills | 0 days | Status: CP
Start: 2018-06-20 — End: ?

## 2018-06-20 MED ORDER — AMOXICILLIN 875 MG TABLET
ORAL_TABLET | Freq: Two times a day (BID) | ORAL | 0 refills | 0 days | Status: CP
Start: 2018-06-20 — End: 2018-06-27

## 2018-06-22 MED ORDER — ERGOCALCIFEROL (VITAMIN D2) 1,250 MCG (50,000 UNIT) CAPSULE
ORAL_CAPSULE | ORAL | 2 refills | 0.00000 days | Status: CP
Start: 2018-06-22 — End: 2018-09-08

## 2018-07-31 MED ORDER — LANCETS 30 GAUGE
3 refills | 0 days | Status: CP
Start: 2018-07-31 — End: ?

## 2018-07-31 MED ORDER — INSULIN SYRINGE U-100 WITH NEEDLE 0.5 ML 31 GAUGE X 5/16" (8 MM)
11 refills | 0 days | Status: CP
Start: 2018-07-31 — End: 2019-07-31

## 2018-08-24 ENCOUNTER — Other Ambulatory Visit: Payer: Self-pay | Admitting: Otolaryngology

## 2018-08-24 DIAGNOSIS — H9210 Otorrhea, unspecified ear: Secondary | ICD-10-CM

## 2018-08-29 ENCOUNTER — Other Ambulatory Visit: Payer: Self-pay

## 2018-08-29 ENCOUNTER — Ambulatory Visit
Admission: RE | Admit: 2018-08-29 | Discharge: 2018-08-29 | Disposition: A | Discharge status: Home / Self Care | Payer: Commercial Managed Care - PPO | Source: Ambulatory Visit | Attending: Otolaryngology | Admitting: Otolaryngology

## 2018-08-29 DIAGNOSIS — H9210 Otorrhea, unspecified ear: Secondary | ICD-10-CM | POA: Diagnosis present

## 2018-09-07 MED ORDER — BASAGLAR 100 UNIT/ML (3 ML) SUBCUTANEOUS PEN
prn refills | 0 days | Status: CP
Start: 2018-09-07 — End: ?

## 2018-09-28 MED ORDER — FLUOXETINE 40 MG CAPSULE
ORAL_CAPSULE | 3 refills | 0 days | Status: CP
Start: 2018-09-28 — End: ?

## 2018-12-27 ENCOUNTER — Encounter: Admit: 2018-12-27 | Discharge: 2018-12-28 | Payer: PRIVATE HEALTH INSURANCE

## 2018-12-27 DIAGNOSIS — I10 Essential (primary) hypertension: Secondary | ICD-10-CM

## 2018-12-27 DIAGNOSIS — E1129 Type 2 diabetes mellitus with other diabetic kidney complication: Principal | ICD-10-CM

## 2018-12-27 DIAGNOSIS — Z794 Long term (current) use of insulin: Secondary | ICD-10-CM

## 2018-12-27 DIAGNOSIS — R809 Proteinuria, unspecified: Secondary | ICD-10-CM

## 2019-01-18 ENCOUNTER — Encounter: Admit: 2019-01-18 | Discharge: 2019-01-19 | Payer: PRIVATE HEALTH INSURANCE

## 2019-01-18 DIAGNOSIS — E1129 Type 2 diabetes mellitus with other diabetic kidney complication: Secondary | ICD-10-CM

## 2019-01-18 DIAGNOSIS — R809 Proteinuria, unspecified: Secondary | ICD-10-CM

## 2019-01-18 DIAGNOSIS — I10 Essential (primary) hypertension: Secondary | ICD-10-CM

## 2019-01-18 DIAGNOSIS — Z794 Long term (current) use of insulin: Secondary | ICD-10-CM

## 2019-01-21 DIAGNOSIS — M549 Dorsalgia, unspecified: Secondary | ICD-10-CM

## 2019-01-21 DIAGNOSIS — L4050 Arthropathic psoriasis, unspecified: Secondary | ICD-10-CM

## 2019-02-08 DIAGNOSIS — M549 Dorsalgia, unspecified: Principal | ICD-10-CM

## 2019-02-08 DIAGNOSIS — M5430 Sciatica, unspecified side: Principal | ICD-10-CM

## 2019-02-08 DIAGNOSIS — L4050 Arthropathic psoriasis, unspecified: Principal | ICD-10-CM

## 2019-02-20 ENCOUNTER — Encounter: Admit: 2019-02-20 | Discharge: 2019-02-20 | Payer: PRIVATE HEALTH INSURANCE

## 2019-02-20 ENCOUNTER — Ambulatory Visit: Admit: 2019-02-20 | Discharge: 2019-02-20 | Payer: PRIVATE HEALTH INSURANCE

## 2019-02-20 DIAGNOSIS — L4050 Arthropathic psoriasis, unspecified: Principal | ICD-10-CM

## 2019-02-20 DIAGNOSIS — Z79899 Other long term (current) drug therapy: Principal | ICD-10-CM

## 2019-02-22 DIAGNOSIS — B9620 Unspecified Escherichia coli [E. coli] as the cause of diseases classified elsewhere: Principal | ICD-10-CM

## 2019-02-22 DIAGNOSIS — N390 Urinary tract infection, site not specified: Principal | ICD-10-CM

## 2019-02-22 MED ORDER — NITROFURANTOIN MONOHYDRATE/MACROCRYSTALS 100 MG CAPSULE
ORAL_CAPSULE | Freq: Two times a day (BID) | ORAL | 0 refills | 7.00000 days | Status: CP
Start: 2019-02-22 — End: 2019-03-01

## 2019-03-03 ENCOUNTER — Other Ambulatory Visit: Payer: Self-pay

## 2019-03-03 ENCOUNTER — Inpatient Hospital Stay
Admission: EM | Admit: 2019-03-03 | Discharge: 2019-03-07 | DRG: 639 | Disposition: A | Discharge status: Home / Self Care | Payer: Commercial Managed Care - PPO | Attending: Internal Medicine | Admitting: Internal Medicine

## 2019-03-03 DIAGNOSIS — Z79899 Other long term (current) drug therapy: Secondary | ICD-10-CM

## 2019-03-03 DIAGNOSIS — Z794 Long term (current) use of insulin: Secondary | ICD-10-CM

## 2019-03-03 DIAGNOSIS — Z79890 Hormone replacement therapy (postmenopausal): Secondary | ICD-10-CM

## 2019-03-03 DIAGNOSIS — R42 Dizziness and giddiness: Secondary | ICD-10-CM | POA: Diagnosis present

## 2019-03-03 DIAGNOSIS — Z87440 Personal history of urinary (tract) infections: Secondary | ICD-10-CM

## 2019-03-03 DIAGNOSIS — Z20828 Contact with and (suspected) exposure to other viral communicable diseases: Secondary | ICD-10-CM | POA: Diagnosis present

## 2019-03-03 DIAGNOSIS — T378X5A Adverse effect of other specified systemic anti-infectives and antiparasitics, initial encounter: Secondary | ICD-10-CM | POA: Diagnosis present

## 2019-03-03 DIAGNOSIS — T383X6A Underdosing of insulin and oral hypoglycemic [antidiabetic] drugs, initial encounter: Secondary | ICD-10-CM | POA: Diagnosis present

## 2019-03-03 DIAGNOSIS — I10 Essential (primary) hypertension: Secondary | ICD-10-CM | POA: Diagnosis present

## 2019-03-03 DIAGNOSIS — E1110 Type 2 diabetes mellitus with ketoacidosis without coma: Secondary | ICD-10-CM | POA: Diagnosis present

## 2019-03-03 DIAGNOSIS — E1010 Type 1 diabetes mellitus with ketoacidosis without coma: Secondary | ICD-10-CM | POA: Diagnosis not present

## 2019-03-03 DIAGNOSIS — Z91138 Patient's unintentional underdosing of medication regimen for other reason: Secondary | ICD-10-CM | POA: Diagnosis not present

## 2019-03-03 DIAGNOSIS — L270 Generalized skin eruption due to drugs and medicaments taken internally: Secondary | ICD-10-CM | POA: Diagnosis present

## 2019-03-03 DIAGNOSIS — L4050 Arthropathic psoriasis, unspecified: Secondary | ICD-10-CM | POA: Diagnosis present

## 2019-03-03 DIAGNOSIS — Z23 Encounter for immunization: Secondary | ICD-10-CM

## 2019-03-03 DIAGNOSIS — T7840XA Allergy, unspecified, initial encounter: Secondary | ICD-10-CM | POA: Diagnosis not present

## 2019-03-03 DIAGNOSIS — E039 Hypothyroidism, unspecified: Secondary | ICD-10-CM | POA: Diagnosis present

## 2019-03-03 HISTORY — DX: Hypothyroidism, unspecified: E03.9

## 2019-03-03 HISTORY — DX: Type 2 diabetes mellitus without complications: E11.9

## 2019-03-03 HISTORY — DX: Depression, unspecified: F32.A

## 2019-03-03 HISTORY — DX: Arthropathic psoriasis, unspecified: L40.50

## 2019-03-03 HISTORY — DX: Essential (primary) hypertension: I10

## 2019-03-03 LAB — COMPREHENSIVE METABOLIC PANEL
ALT: 24 U/L (ref 0–44)
AST: 20 U/L (ref 15–41)
Albumin: 3.8 g/dL (ref 3.5–5.0)
Alkaline Phosphatase: 79 U/L (ref 38–126)
Anion gap: 20 — ABNORMAL HIGH (ref 5–15)
BUN: 25 mg/dL — ABNORMAL HIGH (ref 6–20)
CO2: 17 mmol/L — ABNORMAL LOW (ref 22–32)
Calcium: 9.1 mg/dL (ref 8.9–10.3)
Chloride: 90 mmol/L — ABNORMAL LOW (ref 98–111)
Creatinine, Ser: 0.98 mg/dL (ref 0.44–1.00)
GFR calc Af Amer: >60 mL/min (ref >60)
GFR calc non Af Amer: >60 mL/min (ref >60)
Glucose, Bld: 594 mg/dL (ref 70–99)
Potassium: 4.8 mmol/L (ref 3.5–5.1)
Sodium: 127 mmol/L — ABNORMAL LOW (ref 135–145)
Total Bilirubin: 2 mg/dL — ABNORMAL HIGH (ref 0.3–1.2)
Total Protein: 7.2 g/dL (ref 6.5–8.1)

## 2019-03-03 LAB — CBC
HCT: 52.3 % — ABNORMAL HIGH (ref 36.0–46.0)
Hemoglobin: 17.1 g/dL — ABNORMAL HIGH (ref 12.0–15.0)
MCH: 27.5 pg (ref 26.0–34.0)
MCHC: 32.7 g/dL (ref 30.0–36.0)
MCV: 84.1 fL (ref 80.0–100.0)
Platelets: 383 10*3/uL (ref 150–400)
RBC: 6.22 MIL/uL — ABNORMAL HIGH (ref 3.87–5.11)
RDW: 13.1 % (ref 11.5–15.5)
WBC: 14.4 10*3/uL — ABNORMAL HIGH (ref 4.0–10.5)
nRBC: 0 % (ref 0.0–0.2)

## 2019-03-03 LAB — BLOOD GAS, VENOUS
Acid-base deficit: 6.2 mmol/L — ABNORMAL HIGH (ref 0.0–2.0)
Bicarbonate: 19.6 mmol/L — ABNORMAL LOW (ref 20.0–28.0)
O2 Saturation: 84.9 %
Patient temperature: 37
pCO2, Ven: 39 mmHg — ABNORMAL LOW (ref 44.0–60.0)
pH, Ven: 7.31 (ref 7.250–7.430)
pO2, Ven: 55 mmHg — ABNORMAL HIGH (ref 32.0–45.0)

## 2019-03-03 LAB — GLUCOSE, CAPILLARY
Glucose-Capillary: 576 mg/dL (ref 70–99)
Glucose-Capillary: 451 mg/dL — ABNORMAL HIGH (ref 70–99)
Glucose-Capillary: 472 mg/dL — ABNORMAL HIGH (ref 70–99)
Glucose-Capillary: 342 mg/dL — ABNORMAL HIGH (ref 70–99)
Glucose-Capillary: 313 mg/dL — ABNORMAL HIGH (ref 70–99)
Glucose-Capillary: 301 mg/dL — ABNORMAL HIGH (ref 70–99)

## 2019-03-03 MED ORDER — INSULIN REGULAR(HUMAN) IN NACL 100-0.9 UT/100ML-% IV SOLN
INTRAVENOUS | Status: DC
  Administered 2019-03-03: 9.1 [IU]/h via INTRAVENOUS
  Filled 2019-03-03: qty 100

## 2019-03-03 MED ORDER — DEXTROSE-NACL 5-0.45 % IV SOLN
INTRAVENOUS | Status: DC
  Administered 2019-03-03: 22:00:00 999 mL via INTRAVENOUS

## 2019-03-03 MED ORDER — FAMOTIDINE IN NACL 20-0.9 MG/50ML-% IV SOLN
20.0000 mg | Freq: Two times a day (BID) | INTRAVENOUS | Status: DC
  Administered 2019-03-03 – 2019-03-06 (×6): 20 mg via INTRAVENOUS
  Filled 2019-03-03 (×8): qty 50

## 2019-03-03 MED ORDER — DIPHENHYDRAMINE HCL 50 MG/ML IJ SOLN
50.0000 mg | Freq: Once | INTRAMUSCULAR | Status: AC
  Administered 2019-03-03: 14:00:00 50 mg via INTRAVENOUS
  Filled 2019-03-03: qty 1

## 2019-03-03 MED ORDER — SODIUM CHLORIDE 0.9 % IV BOLUS
1000.0000 mL | Freq: Once | INTRAVENOUS | Status: AC
  Administered 2019-03-03: 14:00:00 1000 mL via INTRAVENOUS

## 2019-03-03 MED ORDER — HYDROXYZINE HCL 25 MG PO TABS
25.0000 mg | ORAL_TABLET | Freq: Three times a day (TID) | ORAL | Status: DC
  Administered 2019-03-03 – 2019-03-04 (×2): 25 mg via ORAL
  Filled 2019-03-03 (×2): qty 1

## 2019-03-03 MED ORDER — POTASSIUM CHLORIDE 10 MEQ/100ML IV SOLN
10.0000 meq | INTRAVENOUS | Status: AC
  Administered 2019-03-03: 10 meq via INTRAVENOUS
  Filled 2019-03-03 (×2): qty 100

## 2019-03-03 MED ORDER — INSULIN ASPART 100 UNIT/ML ~~LOC~~ SOLN
10.0000 [IU] | Freq: Once | SUBCUTANEOUS | Status: AC
  Administered 2019-03-03: 15:00:00 10 [IU] via SUBCUTANEOUS
  Filled 2019-03-03: qty 1

## 2019-03-03 MED ORDER — SODIUM CHLORIDE 0.9 % IV SOLN
INTRAVENOUS | Status: DC
  Administered 2019-03-03: 19:00:00 via INTRAVENOUS

## 2019-03-03 MED ORDER — DIPHENHYDRAMINE HCL 50 MG/ML IJ SOLN
50.0000 mg | Freq: Four times a day (QID) | INTRAMUSCULAR | Status: AC
  Administered 2019-03-03 – 2019-03-04 (×3): 50 mg via INTRAVENOUS
  Filled 2019-03-03 (×3): qty 1

## 2019-03-03 MED ORDER — FAMOTIDINE IN NACL 20-0.9 MG/50ML-% IV SOLN
20.0000 mg | Freq: Once | INTRAVENOUS | Status: AC
  Administered 2019-03-03: 14:00:00 20 mg via INTRAVENOUS
  Filled 2019-03-03: qty 50

## 2019-03-03 MED ORDER — HYDROXYZINE HCL 25 MG PO TABS
25.0000 mg | ORAL_TABLET | Freq: Once | ORAL | Status: AC
  Administered 2019-03-03: 18:00:00 25 mg via ORAL
  Filled 2019-03-03: qty 1

## 2019-03-03 MED ORDER — SODIUM CHLORIDE 0.9 % IV BOLUS
1000.0000 mL | Freq: Once | INTRAVENOUS | Status: AC
  Administered 2019-03-03: 18:00:00 1000 mL via INTRAVENOUS

## 2019-03-03 NOTE — ED Notes (Signed)
See triage note  Presents with generalized rash for several days

## 2019-03-03 NOTE — ED Triage Notes (Signed)
Pt states noticed rash on Friday after eating something new (hamburger steak). States rash to body, worst part is on belly. States started on antibiotic for UTI last week. Doesn't think she's taken this antibiotic before. Red hives noted to arms. States rash is itchy. Showers help with itching per pt. Took benadryl at home yesterday. Also received flu shot on Tuesday.

## 2019-03-03 NOTE — ED Notes (Signed)
Pt given water to drink and bath wipes. Pt reports itching but pt has had atarax and benadryl within the last hour. Notified pt I was unable to do any IV meds but as she reported with showers easing the itching, pt supplied with bath wipes.

## 2019-03-03 NOTE — ED Notes (Signed)
Report given to Ashley RN

## 2019-03-03 NOTE — ED Notes (Signed)
Pt sleeping soundly with snoring present. Pt easily aroused.

## 2019-03-03 NOTE — ED Provider Notes (Signed)
-----------------------------------------   4:56 PM on 03/03/2019 -----------------------------------------  Blood pressure 128/68, pulse (!) 108, temperature 98.6 F (37 C), temperature source Oral, resp. rate 16, height 5\' 3"  (1.6 m), weight 81.6 kg, SpO2 96 %.  Assuming care from [facility].  In short, Krystal Nelson is a 52 y.o. female with a chief complaint of Rash .  Refer to the original H&P for additional details.  Patient presented to the emergency department complaining of rash, weakness, dizziness.  Patient is a known diabetic, has not been taking her insulin for the past 2 days.  On initial work-up by prior provider, it was found that patient had an initial glucose of 576 by fingerstick and 594 on CMP.  Patient was given 10 units of insulin and glucose improved to 472.  However patient did have a positive gap of 20.  At this time the patient was transferred to the major side emergency department so that she may receive fluids, insulin, potassium and be placed on a monitor.  I have assumed care of the patient at this time.    ----------------------------------------- 6:49 PM on 03/03/2019 -----------------------------------------  This time, patient has been started on fluids, insulin, potassium.  Patient will be admitted to the hospitalist service for DKA.  Diagnosis:  DKA        Darletta Moll, PA-C 03/04/19 1512    Arta Silence, MD 03/06/19 [phone]

## 2019-03-03 NOTE — ED Provider Notes (Signed)
-----------------------------------------   7:46 PM on 03/03/2019 -----------------------------------------  ED ECG REPORT I, Arta Silence, the attending physician, personally viewed and interpreted this ECG.  Date: 03/03/2019 EKG Time: 1719 Rate: 95 Rhythm: normal sinus rhythm QRS Axis: normal Intervals: normal ST/T Wave abnormalities: normal Narrative Interpretation: no evidence of acute ischemia    Arta Silence, MD 03/03/19 1946

## 2019-03-03 NOTE — H&P (Signed)
History and Physical    Krystal Nelson FKC:[id number] DOB: [birth year] DOA: 03/03/2019  PCP: [facility] [employer] [facility]  Patient coming from: home  I have personally briefly reviewed patient's old medical records in [facility]  Chief Complaint: Rash and high blood sugar  HPI: Krystal Nelson is a 53 y.o. female with medical history significant for insulin-dependent diabetes, hypertension, hypothyroid, and psoriatic arthritis who presents to the ED with worsening rash for the last 48 to 72 hours.  Patient reports she started Macrobid for UTI approximately 1 week ago and noticed an onset of rash Friday evening.  Became progressively worse, it is nonpurulent and macular in nature, got progressively worse since last night, reports is itchy,..  As a result patient did not take her insulin for the last 2 days she cannot explain why other than stating "I got out of sync".  In the ER patient was noted to have hyperglycemia greater than 500 with a gap of 20 bicarb of 19.  She was placed on insulin drip, and consult in the hospital service   ED Course: Patient was placed on insulin drip, received Benadryl, Pepcid, Vistaril, fluids, BMP shows a sodium of 127 in the setting of a glucose of 594 potassium 4.8 bicarb 17 and gap of 20 and a stress-induced demargination with a white count of 14  Review of Systems: As per HPI otherwise 10 point review of systems done notable for macular rash, itching and hyperglycemia all other reviewed and are negative History reviewed. No pertinent past medical history.  History reviewed. No pertinent surgical history.   reports that she has never smoked. She does not have any smokeless tobacco history on file. She reports that she does not drink alcohol. No history on file for drug.  No Known Allergies  History reviewed. No pertinent family history.   Prior to Admission medications   Medication Sig Start Date End Date Taking? Authorizing Provider  buPROPion  (WELLBUTRIN XL) 150 MG 24 hr tablet Take 150 mg by mouth daily.   Yes [provider]  FLUoxetine (PROZAC) 40 MG capsule Take 40 mg by mouth daily.   Yes [provider]  fluticasone (FLONASE) 50 MCG/ACT nasal spray Place 1-2 sprays into both nostrils daily. 02/25/19  Yes [provider]  glipiZIDE (GLUCOTROL) 5 MG tablet Take 5 mg by mouth 2 (two) times daily before a meal.    Yes [provider]  Insulin Glargine (BASAGLAR KWIKPEN) 100 UNIT/ML SOPN Inject 42 Units into the skin every evening.  12/09/18  Yes [provider]  levothyroxine (SYNTHROID) 75 MCG tablet Take 75 mcg by mouth daily before breakfast.   Yes [provider]  lisinopril (ZESTRIL) 20 MG tablet Take 20 mg by mouth daily.   Yes [provider]  Secukinumab (COSENTYX SENSOREADY PEN) 150 MG/ML SOAJ Inject 150 mg into the skin every 28 (twenty-eight) days. 06/20/18  Yes [provider]  Fluocinolone Acetonide 0.01 % OIL Apply 1 application topically daily. 02/25/19   [provider]  ibuprofen (ADVIL) 200 MG tablet Take 200 mg by mouth every 6 (six) hours as needed for pain.    [provider]    Physical Exam: Vitals:   03/03/19 1200 03/03/19 1204 03/03/19 1501 03/03/19 1737  BP: 121/68  128/68 130/82  Pulse: (!) 113  (!) 108 98  Resp: 16  16 (!) 25  Temp: 98.6 F (37 C)     TempSrc: Oral     SpO2: 92%  96% 99%  Weight:  81.6 kg    Height:  5\' 3"  (1.6 m)      Constitutional: NAD, calm, comfortable Vitals:   03/03/19 1200 03/03/19 1204 03/03/19 1501 03/03/19 1737  BP: 121/68  128/68 130/82  Pulse: (!) 113  (!) 108 98  Resp: 16  16 (!) 25  Temp: 98.6 F (37 C)     TempSrc: Oral     SpO2: 92%  96% 99%  Weight:  81.6 kg    Height:  5\' 3"  (1.6 m)     Eyes: PERRL, lids and conjunctivae normal ENMT: Mucous membranes are dry posterior pharynx clear of any exudate or lesions.Normal dentition.  Neck: normal, supple, no masses, no  thyromegaly Respiratory: clear to auscultation bilaterally, no wheezing, no crackles. Normal respiratory effort. No accessory muscle use.  Cardiovascular: Regular rate and rhythm, no murmurs / rubs / gallops. No extremity edema. 2+ pedal pulses. No carotid bruits.  Abdomen: no tenderness, no masses palpated. No hepatosplenomegaly. Bowel sounds positive.  Musculoskeletal: no clubbing / cyanosis. No joint deformity upper and lower extremities. Good ROM, no contractures. Normal muscle tone.  Skin: Macular rash on abdomen and back upper thighs nonpurulent Neurologic: CN 2-12 grossly intact. Sensation intact, DTR normal. Strength 5/5 in all 4.  Psychiatric: Poor insight and judgment. Alert and oriented x 3. Normal mood.     Labs on Admission: I have personally reviewed following labs and imaging studies  CBC: Recent Labs  Lab 03/03/19 1418  WBC 14.4*  HGB 17.1*  HCT 52.3*  MCV 84.1  PLT 540   Basic Metabolic Panel: Recent Labs  Lab 03/03/19 1418  NA 127*  K 4.8  CL 90*  CO2 17*  GLUCOSE 594*  BUN 25*  CREATININE 0.98  CALCIUM 9.1   GFR: Estimated Creatinine Clearance: 68.7 mL/min (by C-G formula based on SCr of 0.98 mg/dL). Liver Function Tests: Recent Labs  Lab 03/03/19 1418  AST 20  ALT 24  ALKPHOS 79  BILITOT 2.0*  PROT 7.2  ALBUMIN 3.8   No results for input(s): LIPASE, AMYLASE in the last 168 hours. No results for input(s): AMMONIA in the last 168 hours. Coagulation Profile: No results for input(s): INR, PROTIME in the last 168 hours. Cardiac Enzymes: No results for input(s): CKTOTAL, CKMB, CKMBINDEX, TROPONINI in the last 168 hours. BNP (last 3 results) No results for input(s): PROBNP in the last 8760 hours. HbA1C: No results for input(s): HGBA1C in the last 72 hours. CBG: Recent Labs  Lab 03/03/19 1440 03/03/19 1630 03/03/19 1728  GLUCAP 576* 472* 451*   Lipid Profile: No results for input(s): CHOL, HDL, LDLCALC, TRIG, CHOLHDL, LDLDIRECT in the  last 72 hours. Thyroid Function Tests: No results for input(s): TSH, T4TOTAL, FREET4, T3FREE, THYROIDAB in the last 72 hours. Anemia Panel: No results for input(s): VITAMINB12, FOLATE, FERRITIN, TIBC, IRON, RETICCTPCT in the last 72 hours. Urine analysis:    Component Value Date/Time   COLORURINE Yellow 03/04/2014 0948   APPEARANCEUR Clear 03/04/2014 0948   LABSPEC 1.025 03/04/2014 0948   PHURINE 6.0 03/04/2014 0948   GLUCOSEU Negative 03/04/2014 0948   HGBUR Negative 03/04/2014 0948   BILIRUBINUR Negative 03/04/2014 0948   KETONESUR Negative 03/04/2014 0948   PROTEINUR Negative 03/04/2014 0948   NITRITE Negative 03/04/2014 0948   LEUKOCYTESUR Negative 03/04/2014 0948    Radiological Exams on Admission: No results found.  EKG: Independently reviewed.  Normal sinus rhythm no ST changes  Assessment/Plan Active Problems:   DKA (diabetic ketoacidoses) (  HCC) Hypertension Psoriatic arthritis Hypo-thyroid Allergic reaction  DKA.   Patient be admitted to stepdown, DKA 1 protocol with transition to DKA 2 protocol Appropriate fluid resuscitation CBG monitoring BMP every 4 hours  Allergic reaction: Benadryl 50 IV every 6 hours Pepcid 20 mg IV twice daily Vistaril 25 3 times daily Given patient's DKA will avoid steroids at this point - patient is not in acute extremis There is no airway compromise no purulence no hypotension  Hypothyroid  continue patient's home medications  Hypertension  continue patient's home medication blood pressure if blood pressure stable    DVT prophylaxis: Heparin SQ  Code Status: full Advance Directive Documentation     Most Recent Value  Type of Advance Directive  Healthcare Power of Attorney  Pre-existing out of facility DNR order (yellow form or pink MOST form)  -  "MOST" Form in Place?  -     Family Communication: Son who is at bedside Disposition Plan:   Patient remained inpatient to stepdown unit on insulin drip per DKA protocol  and IV antihistamines for allergic reaction.  Patient not medically safe for discharge Consults called: None Admission status: Inpatient   Burke Keelshristopher Jaynia Fendley MD [facility]   If 7PM-7AM, please contact night-coverage   03/03/2019, 6:22 PM

## 2019-03-03 NOTE — ED Provider Notes (Signed)
[facility] Emergency Department Provider Note  ____________________________________________  Time seen: Approximately 12:56 PM  I have reviewed the triage vital signs and the nursing notes.   HISTORY  Chief Complaint Rash    HPI Krystal Nelson is a 53 y.o. female that presents to the emergency department for evaluation of rash to abdomen and back for 3 days.  Rash itches.  She has also had some dizziness for 2 days.  Patient was started on Macrobid last week for a UTI.  She also had a Salsberry steak on Friday which was new for her.  She does not believe that she has been on this medication in the past.  Patient is a diabetic and states that her blood sugars have run been running around 200.  She has not however taken her insulin for last 2 days.  She takes insulin Lantus.  No oral lesions.  Her urinary tract infection has cleared up.  He took some Benadryl yesterday which helped.  No difficulty swallowing, shortness of breath.  History reviewed. No pertinent past medical history.  There are no active problems to display for this patient.   History reviewed. No pertinent surgical history.  Prior to Admission medications   Medication Sig Start Date End Date Taking? Authorizing Provider  buPROPion (WELLBUTRIN XL) 150 MG 24 hr tablet Take 150 mg by mouth daily.   Yes [provider]  FLUoxetine (PROZAC) 40 MG capsule Take 40 mg by mouth daily.   Yes [provider]  fluticasone (FLONASE) 50 MCG/ACT nasal spray Place 1-2 sprays into both nostrils daily. 02/25/19  Yes [provider]  glipiZIDE (GLUCOTROL) 5 MG tablet Take 5 mg by mouth 2 (two) times daily before a meal.    Yes [provider]  Insulin Glargine (BASAGLAR KWIKPEN) 100 UNIT/ML SOPN Inject 42 Units into the skin every evening.  12/09/18  Yes [provider]  levothyroxine (SYNTHROID) 75 MCG tablet Take 75 mcg by mouth daily before breakfast.   Yes [provider]  lisinopril (ZESTRIL) 20 MG tablet Take 20 mg by mouth daily.   Yes [provider]  Secukinumab (COSENTYX SENSOREADY PEN) 150 MG/ML SOAJ Inject 150 mg into the skin every 28 (twenty-eight) days. 06/20/18  Yes [provider]  Fluocinolone Acetonide 0.01 % OIL Apply 1 application topically daily. 02/25/19   [provider]  ibuprofen (ADVIL) 200 MG tablet Take 200 mg by mouth every 6 (six) hours as needed for pain.    [provider]    Allergies Patient has no known allergies.  History reviewed. No pertinent family history.  Social History Social History   Tobacco Use  . Smoking status: Never Smoker  Substance Use Topics  . Alcohol use: Never    Frequency: Never  . Drug use: Not on file     Review of Systems  Cardiovascular: No chest pain. Respiratory: No cough. No SOB. Gastrointestinal: No abdominal pain.  No nausea, no vomiting.  Musculoskeletal: Negative for musculoskeletal pain. Skin: Negative for abrasions, lacerations, ecchymosis.  Positive for rash. Neurological: Negative for headaches   ____________________________________________   PHYSICAL EXAM:  VITAL SIGNS: ED Triage Vitals  Enc Vitals Group     BP 03/03/19 1200 121/68     Pulse Rate 03/03/19 1200 (!) 113     Resp 03/03/19 1200 16     Temp 03/03/19 1200 98.6 F (37 C)     Temp Source 03/03/19 1200 Oral     SpO2 03/03/19  1200 92 %     Weight 03/03/19 1204 180 lb (81.6 kg)     Height 03/03/19 1204 5\' 3"  (1.6 m)     Head Circumference --      Peak Flow --      Pain Score 03/03/19 1203 10     Pain Loc --      Pain Edu? --      Excl. in GC? --      Constitutional: Alert and oriented. Well appearing and in no acute distress. Eyes: Conjunctivae are normal. PERRL. EOMI. Head: Atraumatic. ENT:      Ears:      Nose: No congestion/rhinnorhea.      Mouth/Throat: Mucous membranes are moist.  Neck: No stridor. Cardiovascular: Normal rate, regular rhythm.   Good peripheral circulation. Respiratory: Normal respiratory effort without tachypnea or retractions. Lungs CTAB. Good air entry to the bases with no decreased or absent breath sounds. Musculoskeletal: Full range of motion to all extremities. No gross deformities appreciated. Neurologic:  Normal speech and language. No gross focal neurologic deficits are appreciated.  Skin:  Skin is warm, dry and intact.  Large hives to stomach and back, groin. Psychiatric: Mood and affect are normal. Speech and behavior are normal. Patient exhibits appropriate insight and judgement.   ____________________________________________   LABS (all labs ordered are listed, but only abnormal results are displayed)  Labs Reviewed - No data to display ____________________________________________  EKG  ST ____________________________________________  RADIOLOGY  No results found.  ____________________________________________    PROCEDURES  Procedure(s) performed:    Procedures    Medications - No data to display   ____________________________________________   INITIAL IMPRESSION / ASSESSMENT AND PLAN / ED COURSE  Pertinent labs & imaging results that were available during my care of the patient were reviewed by me and considered in my medical decision making (see chart for details).  Review of the [place] CSRS was performed in accordance of the [facility] prior to dispensing any controlled drugs.     Patient presented to emergency department with concerns of an allergic reaction.  Patient was started on Benadryl, fluids, Pepcid for allergic reaction.  She was not initially started on steroids while awaiting her blood sugar results.  Patient's blood sugar was noted to be 594 in the emergency department, likely why rpatient has been dizzy. She has not been taking her insulin as prescribed.  She was given fluids, 10 units of regular insulin initially. Case was discussed with Dr. 03/05/19 and whether to  transfer patient to the main side of the emergency department and was decided that patient will be continued to be monitored in the fast track side of the emergency department. Care was transferred to [facility] for further monitoring.   Krystal Nelson was evaluated in Emergency Department on 03/03/2019 for the symptoms described in the history of present illness. She was evaluated in the context of the global COVID-19 pandemic, which necessitated consideration that the patient might be at risk for infection with the SARS-CoV-2 virus that causes COVID-19. Institutional protocols and algorithms that pertain to the evaluation of patients at risk for COVID-19 are in a state of rapid change based on information released by regulatory bodies including the CDC and federal and state organizations. These policies and algorithms were followed during the patient's care in the ED.  ____________________________________________  FINAL CLINICAL IMPRESSION(S) / ED DIAGNOSES  Final diagnoses:  None      NEW MEDICATIONS STARTED DURING THIS VISIT:  ED Discharge Orders  None          This chart was dictated using voice recognition software/Dragon. Despite best efforts to proofread, errors can occur which can change the meaning. Any change was purely unintentional.    Enid DerryWagner, Trevaris Pennella, PA-C 03/03/19 1606    Emily FilbertWilliams, Jonathan E, MD 03/04/19 [phone]

## 2019-03-04 ENCOUNTER — Ambulatory Visit: Payer: Commercial Managed Care - PPO | Attending: Physical Therapy | Admitting: Physical Therapy

## 2019-03-04 LAB — BASIC METABOLIC PANEL
Anion gap: 11 (ref 5–15)
Anion gap: 10 (ref 5–15)
BUN: 16 mg/dL (ref 6–20)
BUN: 12 mg/dL (ref 6–20)
CO2: 21 mmol/L — ABNORMAL LOW (ref 22–32)
CO2: 22 mmol/L (ref 22–32)
Calcium: 8.1 mg/dL — ABNORMAL LOW (ref 8.9–10.3)
Calcium: 8 mg/dL — ABNORMAL LOW (ref 8.9–10.3)
Chloride: 100 mmol/L (ref 98–111)
Chloride: 102 mmol/L (ref 98–111)
Creatinine, Ser: 0.38 mg/dL — ABNORMAL LOW (ref 0.44–1.00)
Creatinine, Ser: 0.58 mg/dL (ref 0.44–1.00)
GFR calc Af Amer: >60 mL/min (ref >60)
GFR calc Af Amer: >60 mL/min (ref >60)
GFR calc non Af Amer: >60 mL/min (ref >60)
GFR calc non Af Amer: >60 mL/min (ref >60)
Glucose, Bld: 167 mg/dL — ABNORMAL HIGH (ref 70–99)
Glucose, Bld: 315 mg/dL — ABNORMAL HIGH (ref 70–99)
Potassium: 4.2 mmol/L (ref 3.5–5.1)
Potassium: 3.9 mmol/L (ref 3.5–5.1)
Sodium: 132 mmol/L — ABNORMAL LOW (ref 135–145)
Sodium: 134 mmol/L — ABNORMAL LOW (ref 135–145)

## 2019-03-04 LAB — GLUCOSE, CAPILLARY
Glucose-Capillary: 123 mg/dL — ABNORMAL HIGH (ref 70–99)
Glucose-Capillary: 130 mg/dL — ABNORMAL HIGH (ref 70–99)
Glucose-Capillary: 138 mg/dL — ABNORMAL HIGH (ref 70–99)
Glucose-Capillary: 143 mg/dL — ABNORMAL HIGH (ref 70–99)
Glucose-Capillary: 149 mg/dL — ABNORMAL HIGH (ref 70–99)
Glucose-Capillary: 159 mg/dL — ABNORMAL HIGH (ref 70–99)
Glucose-Capillary: 172 mg/dL — ABNORMAL HIGH (ref 70–99)
Glucose-Capillary: 188 mg/dL — ABNORMAL HIGH (ref 70–99)
Glucose-Capillary: 189 mg/dL — ABNORMAL HIGH (ref 70–99)
Glucose-Capillary: 194 mg/dL — ABNORMAL HIGH (ref 70–99)
Glucose-Capillary: 205 mg/dL — ABNORMAL HIGH (ref 70–99)
Glucose-Capillary: 206 mg/dL — ABNORMAL HIGH (ref 70–99)
Glucose-Capillary: 242 mg/dL — ABNORMAL HIGH (ref 70–99)
Glucose-Capillary: 291 mg/dL — ABNORMAL HIGH (ref 70–99)

## 2019-03-04 LAB — CREATININE, SERUM
Creatinine, Ser: 0.43 mg/dL — ABNORMAL LOW (ref 0.44–1.00)
GFR calc Af Amer: >60 mL/min (ref >60)
GFR calc non Af Amer: >60 mL/min (ref >60)

## 2019-03-04 LAB — HEMOGLOBIN A1C
Hgb A1c MFr Bld: 9.9 % — ABNORMAL HIGH (ref 4.8–5.6)
Mean Plasma Glucose: 237.43 mg/dL

## 2019-03-04 LAB — SARS CORONAVIRUS 2 (TAT 6-24 HRS): SARS Coronavirus 2: NEGATIVE

## 2019-03-04 MED ORDER — INSULIN GLARGINE 100 UNIT/ML ~~LOC~~ SOLN
42.0000 [IU] | Freq: Every day | SUBCUTANEOUS | Status: DC
  Administered 2019-03-05 – 2019-03-07 (×3): 42 [IU] via SUBCUTANEOUS
  Filled 2019-03-04 (×4): qty 0.42

## 2019-03-04 MED ORDER — SODIUM CHLORIDE 0.9 % IV SOLN
INTRAVENOUS | Status: DC
  Administered 2019-03-04 (×2): via INTRAVENOUS

## 2019-03-04 MED ORDER — INSULIN REGULAR(HUMAN) IN NACL 100-0.9 UT/100ML-% IV SOLN: INTRAVENOUS | Status: DC

## 2019-03-04 MED ORDER — SODIUM CHLORIDE 0.9 % IV SOLN: INTRAVENOUS | Status: AC

## 2019-03-04 MED ORDER — DIPHENHYDRAMINE HCL 50 MG/ML IJ SOLN
25.0000 mg | Freq: Four times a day (QID) | INTRAMUSCULAR | Status: DC
  Administered 2019-03-04 – 2019-03-06 (×8): 25 mg via INTRAVENOUS
  Filled 2019-03-04 (×9): qty 1

## 2019-03-04 MED ORDER — POTASSIUM CHLORIDE 10 MEQ/100ML IV SOLN
10.0000 meq | INTRAVENOUS | Status: AC
  Administered 2019-03-04 (×2): 10 meq via INTRAVENOUS
  Filled 2019-03-04 (×2): qty 100

## 2019-03-04 MED ORDER — INSULIN ASPART 100 UNIT/ML ~~LOC~~ SOLN
0.0000 [IU] | Freq: Three times a day (TID) | SUBCUTANEOUS | Status: DC
  Administered 2019-03-04: 13:00:00 1 [IU] via SUBCUTANEOUS
  Administered 2019-03-04: 3 [IU] via SUBCUTANEOUS
  Filled 2019-03-04 (×2): qty 1

## 2019-03-04 MED ORDER — PNEUMOCOCCAL VAC POLYVALENT 25 MCG/0.5ML IJ INJ
0.5000 mL | INJECTION | INTRAMUSCULAR | Status: AC
  Administered 2019-03-05: 10:00:00 0.5 mL via INTRAMUSCULAR
  Filled 2019-03-04 (×2): qty 0.5

## 2019-03-04 MED ORDER — INSULIN GLARGINE 100 UNIT/ML ~~LOC~~ SOLN
30.0000 [IU] | Freq: Every day | SUBCUTANEOUS | Status: DC
  Administered 2019-03-04: 30 [IU] via SUBCUTANEOUS
  Filled 2019-03-04: qty 0.3

## 2019-03-04 MED ORDER — DEXTROSE-NACL 5-0.45 % IV SOLN
INTRAVENOUS | Status: DC
  Administered 2019-03-04 – 2019-03-05 (×2): via INTRAVENOUS

## 2019-03-04 MED ORDER — INSULIN ASPART 100 UNIT/ML ~~LOC~~ SOLN
0.0000 [IU] | SUBCUTANEOUS | Status: DC
  Administered 2019-03-05: 5 [IU] via SUBCUTANEOUS
  Administered 2019-03-05: 2 [IU] via SUBCUTANEOUS
  Administered 2019-03-05: 04:00:00 3 [IU] via SUBCUTANEOUS
  Filled 2019-03-04 (×3): qty 1

## 2019-03-04 MED ORDER — HEPARIN SODIUM (PORCINE) 5000 UNIT/ML IJ SOLN
5000.0000 [IU] | Freq: Three times a day (TID) | INTRAMUSCULAR | Status: DC
  Administered 2019-03-04 – 2019-03-07 (×8): 5000 [IU] via SUBCUTANEOUS
  Filled 2019-03-04 (×10): qty 1

## 2019-03-04 NOTE — ED Notes (Signed)
Pt given meal tray, socks and warm blanket

## 2019-03-04 NOTE — ED Notes (Signed)
Patient is resting comfortably. 

## 2019-03-04 NOTE — Progress Notes (Signed)
Report called to [facility] on 1C. Patient is stable and being transported to unit.

## 2019-03-04 NOTE — ED Notes (Signed)
Paused D50 so that pepcid could be infused due to patient being itchy.

## 2019-03-04 NOTE — ED Notes (Signed)
CBG 205 

## 2019-03-04 NOTE — ED Notes (Signed)
CBG-291 

## 2019-03-04 NOTE — ED Notes (Signed)
Charge nurse Lovena Le reported ICU will not take patient at this time due to another patient needing to come first and ICU only takes one patient at a time. Will try again after first patient goes up

## 2019-03-04 NOTE — Progress Notes (Addendum)
Inpatient Diabetes Program Recommendations  AACE/ADA: New Consensus Statement on Inpatient Glycemic Control   Target Ranges:  Prepandial:   less than 140 mg/dL      Peak postprandial:   less than 180 mg/dL (1-2 hours)      Critically ill patients:  140 - 180 mg/dL   Results for Krystal Nelson, Krystal Nelson (MRN [id number]) as of 03/04/2019 07:37  Ref. Range 03/04/2019 00:21 03/04/2019 01:45 03/04/2019 03:05 03/04/2019 04:15 03/04/2019 05:43 03/04/2019 06:54  Glucose-Capillary Latest Ref Range: 70 - 99 mg/dL 885 (H) 027 (H) 741 (H) 242 (H) 123 (H) 138 (H)  Results for Krystal Nelson, Krystal Nelson (MRN [id number]) as of 03/04/2019 07:37  Ref. Range 03/03/2019 14:18  Glucose Latest Ref Range: 70 - 99 mg/dL 094 [facility])  Results for Krystal Nelson, Krystal Nelson (MRN [id number]) as of 03/04/2019 07:37  Ref. Range 03/03/2019 14:18  CO2 Latest Ref Range: 22 - 32 mmol/L 17 (L)  Results for Krystal Nelson, Krystal Nelson (MRN [id number]) as of 03/04/2019 07:37  Ref. Range 03/03/2019 14:18  Anion gap Latest Ref Range: 5 - 15  20 (H)  Results for Krystal Nelson, Krystal Nelson (MRN [id number]) as of 03/04/2019 15:40  Ref. Range 03/04/2019 04:15  Hemoglobin A1C Latest Ref Range: 4.8 - 5.6 % 9.9 (H)   Review of Glycemic Control  Diabetes history: DM2 Outpatient Diabetes medications: Basaglar 42 units QPM, Glipizide 5 mg BID Current orders for Inpatient glycemic control: IV insulin, Novolog 0-9 units TID with meals  Inpatient Diabetes Program Recommendations:   Insulin at Transition from IV to SQ insulin: Recommend ordering current BMET to ensure acidosis resolved.  Once acidosis is cleared and MD is ready to transition from IV to SQ insulin, please consider ordering Lantus 25 units Q24H, CBGs Q4H, Novolog 0-9 units Q4H.  A1C: A1C in process.  NOTE: Per H&P, patient developed a rash and did not take insulin for last 2 days. Initial glucose 594 mg/dl and IV insulin was started for DKA. Current glucose down to 138 mg/dl at 2:75 am on 17/00/17. Recommend ordering current BMET to  ensure acidosis resolved. If acidosis is resolved, please consider ordering Lantus 25 units Q24H, CBGs Q4H, Novolog 0-9 units Q4H.  Addendum 03/04/19@14 :30-Spoke with patient about diabetes and home regimen for diabetes control. Patient reports being followed by [facility] for diabetes management and currently taking Basaglar 42 units QPM and Glipizide 5 mg BID as an outpatient for diabetes control. Patient reports taking DM medications as prescribed but admits that she had not taken insulin the last 2 days due to not feeling well.  Discussed importance of taking DM medications consistently. Patient states that she has very little Basaglar, Glipizide, and insulin pen needles at home and she requested refills for each at time of discharge.  She also reports that her glucometer is several years old and she would like a prescription for a new glucometer at time of discharge as well. Patient reports that no changes were made with DM medications at last office visit. Patient reports checking glucose at home but admits that she does not check glucose consistently. Patient reports that glucose is usually in the 200's mg/dl and that her last C9S was 9% 2-3 weeks ago.  Discussed A1C results (9.9% on 03/04/19 ) and reviewed glucose and A1C goals. Discussed importance of checking CBGs and maintaining good CBG control to prevent long-term and short-term complications. Explained how hyperglycemia leads to damage within blood vessels which lead to the common complications seen with uncontrolled diabetes. Stressed to  the patient the importance of improving glycemic control to prevent further complications from uncontrolled diabetes. Discussed impact of nutrition, exercise, stress, sickness, and medications on diabetes control.  Asked patient to check glucose consistently at home and to take her glucometer with her to follow up appointments. Also encouraged patient to ask about being referred to an Endocrinologist to  assist with improving DM control.  Patient verbalized understanding of information discussed and reports no further questions at this time related to diabetes. MD, at time of discharge please provide Rx for: glucometer, Basaglar insulin pens, insulin pen needles, and Glipizide.   Thanks, Barnie Alderman, RN, MSN, CDE Diabetes Coordinator Inpatient Diabetes Program [phone] (Team Pager from 8am to 5pm)

## 2019-03-04 NOTE — Progress Notes (Signed)
PROGRESS NOTE    KENADIE ROYCE  NAT:[id number] DOB: 1966-02-20 DOA: 03/03/2019 PCP: [facility]   Brief Narrative:  Krystal Nelson is a 53 y.o. female with medical history significant for insulin-dependent diabetes, hypertension, hypothyroid, and psoriatic arthritis who presents to the ED with worsening rash for the last 48 to 72 hours.  Patient reports she started Macrobid for UTI approximately 1 week ago and noticed an onset of rash Friday evening.  Became progressively worse, it is nonpurulent and macular in nature, got progressively worse since last night, reports is itchy,..  As a result patient did not take her insulin for the last 2 days she cannot explain why other than stating "I got out of sync".  In the ER patient was noted to have hyperglycemia greater than 500 with a gap of 20 bicarb of 19.  She was placed on insulin drip, and consult in the hospital service    Assessment & Plan:   Principal Problem:   Allergic reaction Active Problems:   DKA (diabetic ketoacidoses) (HCC)   Psoriatic arthritis (HCC)   HTN (hypertension)   Hypothyroid   DKA.   Resolved, Weaned off insulin drip Restarted Lantus Sliding scale insulin Diabetic diet  Allergic reaction: Benadryl 50 IV every 6 hours x4 now 25 IV every 6 Pepcid 20 mg IV twice daily Given patient's DKA will continue to avoid steroids at this point - patient is not in acute extremis There is no airway compromise no purulence no hypotension  Hypothyroid  continue patient's home medications  Hypertension  continue patient's home medication blood pressure if blood pressure stable  DVT prophylaxis: Heparin SQ  Code Status: Full    Code Status Orders  (From admission, onward)         Start     Ordered   03/04/19 0028  Full code  Continuous     03/04/19 0027        Code Status History    This patient has a current code status but no historical code status.   Advance Care Planning Activity     Advance Directive Documentation     Most Recent Value  Type of Advance Directive  Healthcare Power of Attorney  Pre-existing out of facility DNR order (yellow form or pink MOST form)  -  "MOST" Form in Place?  -     Family Communication: Cussed with patient in detail Disposition Plan:   Continue with inpatient treatment,treat allergic reaction with iv antihistamines, add steroids if does not continue to improve, not yet ready for dd/c Consults called: None Admission status: Inpatient   Consultants:   None  Procedures:   No results found.  Antimicrobials:   None   Subjective: Patient rash is improving Blood sugar under better control She has been weaned off the insulin drip  Objective: Vitals:   03/04/19 1157 03/04/19 1200 03/04/19 1215 03/04/19 1230  BP:  119/70  118/77  Pulse:   94 100  Resp:  19 18 18   Temp:      TempSrc:      SpO2:   99% 97%  Weight: 81.6 kg     Height: 5\' 3"  (1.6 m)       Intake/Output Summary (Last 24 hours) at 03/04/2019 1259 Last data filed at 03/04/2019 0308 Gross per 24 hour  Intake 2099.08 ml  Output -  Net 2099.08 ml   Filed Weights   03/03/19 1204 03/04/19 1157  Weight: 81.6 kg 81.6 kg  Examination:  General exam: Appears calm and comfortable  Respiratory system: Clear to auscultation. Respiratory effort normal. Cardiovascular system: S1 & S2 heard, RRR. No JVD, murmurs, rubs, gallops or clicks. No pedal edema. Gastrointestinal system: Abdomen is nondistended, soft and nontender. No organomegaly or masses felt. Normal bowel sounds heard. Central nervous system: Alert and oriented. No focal neurological deficits. Extremities: Warm well perfused, moves all 4 extremities freely, neurovascularly intact. Skin: Macular rash on lower extremities upper extremities abdomen and back significantly improved from prior, no evidence of purulence or infection Psychiatry: Judgement and insight poor. Mood & affect appropriate.      Data Reviewed: I have personally reviewed following labs and imaging studies  CBC: Recent Labs  Lab 03/03/19 1418  WBC 14.4*  HGB 17.1*  HCT 52.3*  MCV 84.1  PLT 383   Basic Metabolic Panel: Recent Labs  Lab 03/03/19 1418 03/04/19 0415  NA 127* 132*  K 4.8 4.2  CL 90* 100  CO2 17* 21*  GLUCOSE 594* 167*  BUN 25* 16  CREATININE 0.98 0.38*  0.43*  CALCIUM 9.1 8.1*   GFR: Estimated Creatinine Clearance: 84.2 mL/min (A) (by C-G formula based on SCr of 0.43 mg/dL (L)). Liver Function Tests: Recent Labs  Lab 03/03/19 1418  AST 20  ALT 24  ALKPHOS 79  BILITOT 2.0*  PROT 7.2  ALBUMIN 3.8   No results for input(s): LIPASE, AMYLASE in the last 168 hours. No results for input(s): AMMONIA in the last 168 hours. Coagulation Profile: No results for input(s): INR, PROTIME in the last 168 hours. Cardiac Enzymes: No results for input(s): CKTOTAL, CKMB, CKMBINDEX, TROPONINI in the last 168 hours. BNP (last 3 results) No results for input(s): PROBNP in the last 8760 hours. HbA1C: Recent Labs    03/04/19 0415  HGBA1C 9.9*   CBG: Recent Labs  Lab 03/04/19 0305 03/04/19 0415 03/04/19 0543 03/04/19 0654 03/04/19 0808  GLUCAP 172* 242* 123* 138* 130*   Lipid Profile: No results for input(s): CHOL, HDL, LDLCALC, TRIG, CHOLHDL, LDLDIRECT in the last 72 hours. Thyroid Function Tests: No results for input(s): TSH, T4TOTAL, FREET4, T3FREE, THYROIDAB in the last 72 hours. Anemia Panel: No results for input(s): VITAMINB12, FOLATE, FERRITIN, TIBC, IRON, RETICCTPCT in the last 72 hours. Sepsis Labs: No results for input(s): PROCALCITON, LATICACIDVEN in the last 168 hours.  Recent Results (from the past 240 hour(s))  SARS CORONAVIRUS 2 (TAT 6-24 HRS) Nasopharyngeal Nasopharyngeal Swab     Status: None   Collection Time: 03/03/19  5:40 PM   Specimen: Nasopharyngeal Swab  Result Value Ref Range Status   SARS Coronavirus 2 NEGATIVE NEGATIVE Final    Comment: (NOTE)  SARS-CoV-2 target nucleic acids are NOT DETECTED. The SARS-CoV-2 RNA is generally detectable in upper and lower respiratory specimens during the acute phase of infection. Negative results do not preclude SARS-CoV-2 infection, do not rule out co-infections with other pathogens, and should not be used as the sole basis for treatment or other patient management decisions. Negative results must be combined with clinical observations, patient history, and epidemiological information. The expected result is Negative. Fact Sheet for Patients: HairSlick.no Fact Sheet for Healthcare Providers: quierodirigir.com This test is not yet approved or cleared by the [place] FDA and  has been authorized for detection and/or diagnosis of SARS-CoV-2 by FDA under an Emergency Use Authorization (EUA). This EUA will remain  in effect (meaning this test can be used) for the duration of the COVID-19 declaration under Section 56 4(b)(1) of the  Act, 21 U.S.C. section 360bbb-3(b)(1), unless the authorization is terminated or revoked sooner. Performed at [facility], [address] [place], [place] [address]          Radiology Studies: No results found.      Scheduled Meds: . diphenhydrAMINE  25 mg Intravenous Q6H  . diphenhydrAMINE  50 mg Intravenous Q6H  . heparin  5,000 Units Subcutaneous Q8H  . insulin aspart  0-9 Units Subcutaneous TID WC  . insulin glargine  30 Units Subcutaneous Daily   Continuous Infusions: . sodium chloride Stopped (03/03/19 2228)  . sodium chloride 125 mL/hr at 03/04/19 0310  . dextrose 5 % and 0.45% NaCl Stopped (03/04/19 0812)  . dextrose 5 % and 0.45% NaCl 100 mL/hr at 03/04/19 1249  . famotidine (PEPCID) IV 20 mg (03/04/19 1206)  . insulin Stopped (03/04/19 1251)     LOS: 1 day    Time spent: 35 min    Burke Keelshristopher , MD [facility]  If 7PM-7AM, please contact  night-coverage  03/04/2019, 12:59 PM

## 2019-03-04 NOTE — ED Notes (Signed)
No results yet from repeat met B. Attempt to call lab unsuccessful.

## 2019-03-05 LAB — BASIC METABOLIC PANEL
Anion gap: 6 (ref 5–15)
BUN: 9 mg/dL (ref 6–20)
CO2: 23 mmol/L (ref 22–32)
Calcium: 7.7 mg/dL — ABNORMAL LOW (ref 8.9–10.3)
Chloride: 102 mmol/L (ref 98–111)
Creatinine, Ser: 0.39 mg/dL — ABNORMAL LOW (ref 0.44–1.00)
GFR calc Af Amer: >60 mL/min (ref >60)
GFR calc non Af Amer: >60 mL/min (ref >60)
Glucose, Bld: 242 mg/dL — ABNORMAL HIGH (ref 70–99)
Potassium: 3.5 mmol/L (ref 3.5–5.1)
Sodium: 131 mmol/L — ABNORMAL LOW (ref 135–145)

## 2019-03-05 LAB — GLUCOSE, CAPILLARY
Glucose-Capillary: 195 mg/dL — ABNORMAL HIGH (ref 70–99)
Glucose-Capillary: 238 mg/dL — ABNORMAL HIGH (ref 70–99)
Glucose-Capillary: 279 mg/dL — ABNORMAL HIGH (ref 70–99)
Glucose-Capillary: 294 mg/dL — ABNORMAL HIGH (ref 70–99)
Glucose-Capillary: 252 mg/dL — ABNORMAL HIGH (ref 70–99)
Glucose-Capillary: 301 mg/dL — ABNORMAL HIGH (ref 70–99)

## 2019-03-05 MED ORDER — SODIUM CHLORIDE 0.9 % IV SOLN
INTRAVENOUS | Status: DC
  Administered 2019-03-05 – 2019-03-06 (×3): via INTRAVENOUS

## 2019-03-05 MED ORDER — INSULIN ASPART 100 UNIT/ML ~~LOC~~ SOLN
0.0000 [IU] | Freq: Three times a day (TID) | SUBCUTANEOUS | Status: DC
  Administered 2019-03-05: 17:00:00 15 [IU] via SUBCUTANEOUS
  Administered 2019-03-06: 18:00:00 11 [IU] via SUBCUTANEOUS
  Administered 2019-03-06 – 2019-03-07 (×4): 15 [IU] via SUBCUTANEOUS
  Filled 2019-03-05 (×6): qty 1

## 2019-03-05 MED ORDER — INSULIN ASPART 100 UNIT/ML ~~LOC~~ SOLN
5.0000 [IU] | Freq: Once | SUBCUTANEOUS | Status: AC
  Administered 2019-03-05: 5 [IU] via SUBCUTANEOUS
  Filled 2019-03-05: qty 1

## 2019-03-05 MED ORDER — INSULIN ASPART 100 UNIT/ML ~~LOC~~ SOLN
0.0000 [IU] | Freq: Every day | SUBCUTANEOUS | Status: DC
  Administered 2019-03-05: 4 [IU] via SUBCUTANEOUS
  Administered 2019-03-06: 22:00:00 3 [IU] via SUBCUTANEOUS
  Filled 2019-03-05 (×2): qty 1

## 2019-03-05 MED ORDER — METHYLPREDNISOLONE SODIUM SUCC 125 MG IJ SOLR
60.0000 mg | Freq: Four times a day (QID) | INTRAMUSCULAR | Status: DC
  Administered 2019-03-05 – 2019-03-06 (×4): 60 mg via INTRAVENOUS
  Filled 2019-03-05 (×4): qty 2

## 2019-03-05 NOTE — Progress Notes (Signed)
PROGRESS NOTE    Krystal Nelson  ZOX:[id number] DOB: [birth year] DOA: 03/03/2019 PCP: [facility]   Brief Narrative:  Krystal Nelson a 53 y.o.femalewith medical history significant forinsulin-dependent diabetes, hypertension, hypothyroid, and psoriatic arthritis who presents to the ED with worsening rash for the last 48 to 72 hours. Patient reports she started Macrobid for UTI approximately 1 week ago and noticed an onset of rash Friday evening. Became progressively worse, it isnonpurulent and macular in nature, got progressively worse since last night, reports is itchy,.. As a result patient did not take her insulin for the last 2 days she cannot explain why other than stating "I got out of sync". In the ER patient was noted to have hyperglycemia greater than 500 with a gap of 20 bicarb of 19. She was placed on insulin drip, and consult in the hospital service   Assessment & Plan:   Principal Problem:   Allergic reaction Active Problems:   DKA (diabetic ketoacidoses) (HCC)   Psoriatic arthritis (HCC)   HTN (hypertension)   Hypothyroid   DKA uncontrolled hyperglycemia  Resolved, A1C 9.9 Weaned off insulin drip Restarted Lantus 42 daily DM coordinator consulted Sliding scale insulin Diabetic diet  Allergic reaction: Slowly improving Will add iv steroid today-DKA resolved Increased SSI  Benadryl 50 IV every 6 hours x4 now 25 IV every 6 Pepcid 20 mg IV twice daily There is no airway compromise no purulence no hypotension  Hypothyroid  continue patient's home medications  Hypertension  continue patient's home medication blood pressure if blood pressure stable  DVT prophylaxis: Heparin SQ  Code Status: FULL    Code Status Orders  (From admission, onward)         Start     Ordered   03/04/19 0028  Full code  Continuous     03/04/19 0027        Code Status History    This patient has a current code status but no historical code status.   Advance Care Planning Activity    Advance Directive Documentation     Most Recent Value  Type of Advance Directive  Healthcare Power of Attorney  Pre-existing out of facility DNR order (yellow form or pink MOST form)  -  "MOST" Form in Place?  -     Family Communication: Discussed in detail with patient Disposition Plan:    Continue with inpatient treatment,treat allergic reaction with iv antihistamines, add steroids if does not continue to improve, not yet ready for dd/c  Consults called: None Admission status: Inpatient   Consultants:   Diabetic coordinator  Procedures:   No results found.  Antimicrobials:   None   Subjective: Rash is improving although slowly DKA resolved  Objective: Vitals:   03/04/19 1600 03/04/19 1856 03/04/19 2039 03/05/19 0408  BP: (!) 136/53 131/84 120/75 124/69  Pulse: (!) 108 (!) 104 (!) 106 91  Resp: 18 10  15   Temp:   99.1 F (37.3 C) 98.3 F (36.8 C)  TempSrc:   Oral Oral  SpO2: 95% 96% 96% 96%  Weight:      Height:        Intake/Output Summary (Last 24 hours) at 03/05/2019 1255 Last data filed at 03/05/2019 0454 Gross per 24 hour  Intake 100 ml  Output -  Net 100 ml   Filed Weights   03/03/19 1204 03/04/19 1157  Weight: 81.6 kg 81.6 kg    Examination:  General exam: Appears calm and comfortable to report mild  itching although improved Respiratory system: Clear to auscultation. Respiratory effort normal. Cardiovascular system: S1 & S2 heard, RRR. No JVD, murmurs, rubs, gallops or clicks. No pedal edema. Gastrointestinal system: Abdomen is nondistended, soft and nontender. No organomegaly or masses felt. Normal bowel sounds heard. Central nervous system: Alert and oriented. No focal neurological deficits. Extremities: Moves all 4 extremities freely, no focal neurological deficits Skin: Skin rash improving decreased erythema, no purulence, decrease itching, Psychiatry: Judgement and insight appear normal. Mood & affect  appropriate.     Data Reviewed: I have personally reviewed following labs and imaging studies  CBC: Recent Labs  Lab 03/03/19 1418  WBC 14.4*  HGB 17.1*  HCT 52.3*  MCV 84.1  PLT 619   Basic Metabolic Panel: Recent Labs  Lab 03/03/19 1418 03/04/19 0415 03/04/19 2233 03/05/19 0416  NA 127* 132* 134* 131*  K 4.8 4.2 3.9 3.5  CL 90* 100 102 102  CO2 17* 21* 22 23  GLUCOSE 594* 167* 315* 242*  BUN 25* 16 12 9   CREATININE 0.98 0.38*  0.43* 0.58 0.39*  CALCIUM 9.1 8.1* 8.0* 7.7*   GFR: Estimated Creatinine Clearance: 84.2 mL/min (A) (by C-G formula based on SCr of 0.39 mg/dL (L)). Liver Function Tests: Recent Labs  Lab 03/03/19 1418  AST 20  ALT 24  ALKPHOS 79  BILITOT 2.0*  PROT 7.2  ALBUMIN 3.8   No results for input(s): LIPASE, AMYLASE in the last 168 hours. No results for input(s): AMMONIA in the last 168 hours. Coagulation Profile: No results for input(s): INR, PROTIME in the last 168 hours. Cardiac Enzymes: No results for input(s): CKTOTAL, CKMB, CKMBINDEX, TROPONINI in the last 168 hours. BNP (last 3 results) No results for input(s): PROBNP in the last 8760 hours. HbA1C: Recent Labs    03/04/19 0415  HGBA1C 9.9*   CBG: Recent Labs  Lab 03/04/19 2139 03/05/19 0001 03/05/19 0410 03/05/19 0748 03/05/19 1152  GLUCAP 279* 294* 238* 195* 252*   Lipid Profile: No results for input(s): CHOL, HDL, LDLCALC, TRIG, CHOLHDL, LDLDIRECT in the last 72 hours. Thyroid Function Tests: No results for input(s): TSH, T4TOTAL, FREET4, T3FREE, THYROIDAB in the last 72 hours. Anemia Panel: No results for input(s): VITAMINB12, FOLATE, FERRITIN, TIBC, IRON, RETICCTPCT in the last 72 hours. Sepsis Labs: No results for input(s): PROCALCITON, LATICACIDVEN in the last 168 hours.  Recent Results (from the past 240 hour(s))  SARS CORONAVIRUS 2 (TAT 6-24 HRS) Nasopharyngeal Nasopharyngeal Swab     Status: None   Collection Time: 03/03/19  5:40 PM   Specimen:  Nasopharyngeal Swab  Result Value Ref Range Status   SARS Coronavirus 2 NEGATIVE NEGATIVE Final    Comment: (NOTE) SARS-CoV-2 target nucleic acids are NOT DETECTED. The SARS-CoV-2 RNA is generally detectable in upper and lower respiratory specimens during the acute phase of infection. Negative results do not preclude SARS-CoV-2 infection, do not rule out co-infections with other pathogens, and should not be used as the sole basis for treatment or other patient management decisions. Negative results must be combined with clinical observations, patient history, and epidemiological information. The expected result is Negative. Fact Sheet for Patients: SugarRoll.be Fact Sheet for Healthcare Providers: https://www.woods-mathews.com/ This test is not yet approved or cleared by the [place] FDA and  has been authorized for detection and/or diagnosis of SARS-CoV-2 by FDA under an Emergency Use Authorization (EUA). This EUA will remain  in effect (meaning this test can be used) for the duration of the COVID-19 declaration under Section 56  4(b)(1) of the Act, 21 U.S.C. section 360bbb-3(b)(1), unless the authorization is terminated or revoked sooner. Performed at [facility], [address] [place], [place] [address]          Radiology Studies: No results found.      Scheduled Meds: . diphenhydrAMINE  25 mg Intravenous Q6H  . heparin  5,000 Units Subcutaneous Q8H  . insulin aspart  0-20 Units Subcutaneous TID WC  . insulin aspart  0-5 Units Subcutaneous QHS  . insulin glargine  42 Units Subcutaneous Daily  . methylPREDNISolone (SOLU-MEDROL) injection  60 mg Intravenous Q6H   Continuous Infusions: . sodium chloride 100 mL/hr at 03/05/19 1233  . famotidine (PEPCID) IV 20 mg (03/05/19 0958)     LOS: 2 days    Time spent: 7935 MIN    Burke Keelshristopher Hodges Treiber, MD [facility]  If 7PM-7AM, please contact  night-coverage  03/05/2019, 12:55 PM

## 2019-03-05 NOTE — Progress Notes (Signed)
Inpatient Diabetes Program Recommendations  AACE/ADA: New Consensus Statement on Inpatient Glycemic Control   Target Ranges:  Prepandial:   less than 140 mg/dL      Peak postprandial:   less than 180 mg/dL (1-2 hours)      Critically ill patients:  140 - 180 mg/dL   Results for CHRISTEAN, SILVESTRI (MRN [id number]) as of 03/05/2019 08:01  Ref. Range 03/04/2019 06:54 03/04/2019 08:08 03/04/2019 09:45 03/04/2019 10:47 03/04/2019 11:58 03/04/2019 12:51 03/04/2019 14:50 03/04/2019 16:53 03/05/2019 03/05/2019 04:19 03/05/2019 07:48  Glucose-Capillary Latest Ref Range: 70 - 99 mg/dL 138 (H) 130 (H) 188 (H) 189 (H) 159 (H) 143 (H) 205 (H) 291 (H)   Novolog 5 units @00 :26 238 (H)  Novolog 3 units @ 4:19 195 (H)  Results for CAPRIA, CARTAYA (MRN [id number]) as of 03/05/2019 08:01  Ref. Range 03/04/2019 04:15  Hemoglobin A1C Latest Ref Range: 4.8 - 5.6 % 9.9 (H)   Review of Glycemic Control  Diabetes history: DM2 Outpatient Diabetes medications: Basaglar 42 units QPM, Glipizide 5 mg BID Current orders for Inpatient glycemic control:  Lantus 42 units daily, Novolog 0-9 units Q4H  Inpatient Diabetes Program Recommendations:   Insulin-Basal: Noted patient received Lantus 30 units on 03/04/19 at time of transition off IV insulin to SQ insulin. Currently ordered Lantus 42 units daily which patient is scheduled to receive at 10 am today.  A1C: A1C 9.9% on 03/04/19 indicating an average glucose of 237 mg/dl over the past 2-3 months. Patient reports that her glucometer is several years old and she requested Rx for new glucometer. Patient also states that she has very little DM medications at home and requested Rx for DM medications as well. At time of discharge please provide Rx for: glucometer, Basaglar insulin pens, insulin pen needles, and Glipizide.   Thanks, Krystal Alderman, RN, MSN, CDE Diabetes Coordinator Inpatient Diabetes Program [phone] (Team Pager from 8am to 5pm)

## 2019-03-05 NOTE — Plan of Care (Signed)
  RD consulted for nutrition education regarding diabetes.   Lab Results  Component Value Date   HGBA1C 9.9 (H) 03/04/2019    Met with patient at bedside. She reports she has not previously been education on carbohydrate modified diet. She has a good appetite and intake at baseline. She reports she often feels hungry after her meals and starts eating snacks shortly after which she feels may be contributing to her elevated CBGs. Discussed importance of adequate intake at meals so patient does not become overly hungry directly after meal. Discussed choosing well-balanced plate with adequate protein and fiber.  RD provided "Carbohydrate Counting for People with Diabetes" handout from the Academy of Nutrition and Dietetics. Discussed different food groups and their effects on blood sugar, emphasizing carbohydrate-containing foods. Provided list of carbohydrates and recommended serving sizes of common foods.  Discussed importance of controlled and consistent carbohydrate intake throughout the day. Provided examples of ways to balance meals/snacks and encouraged intake of high-fiber, whole grain complex carbohydrates. Teach back method used.  Expect good compliance.  Body mass index is 31.89 kg/m. Pt meets criteria for obesity class I based on current BMI.  Current diet order is carbohydrate modified, patient is consuming approximately 100% of meals at this time. Labs and medications reviewed. No further nutrition interventions warranted at this time. RD contact information provided. If additional nutrition issues arise, please re-consult RD.  Jacklynn Barnacle, MS, RD, LDN Office: [phone] Pager: [phone] After Hours/Weekend Pager: [phone]

## 2019-03-06 DIAGNOSIS — I10 Essential (primary) hypertension: Secondary | ICD-10-CM

## 2019-03-06 DIAGNOSIS — E1010 Type 1 diabetes mellitus with ketoacidosis without coma: Secondary | ICD-10-CM

## 2019-03-06 DIAGNOSIS — E039 Hypothyroidism, unspecified: Secondary | ICD-10-CM

## 2019-03-06 DIAGNOSIS — T7840XA Allergy, unspecified, initial encounter: Secondary | ICD-10-CM

## 2019-03-06 LAB — GLUCOSE, CAPILLARY
Glucose-Capillary: 219 mg/dL — ABNORMAL HIGH (ref 70–99)
Glucose-Capillary: 274 mg/dL — ABNORMAL HIGH (ref 70–99)
Glucose-Capillary: 334 mg/dL — ABNORMAL HIGH (ref 70–99)
Glucose-Capillary: 328 mg/dL — ABNORMAL HIGH (ref 70–99)
Glucose-Capillary: 350 mg/dL — ABNORMAL HIGH (ref 70–99)
Glucose-Capillary: 273 mg/dL — ABNORMAL HIGH (ref 70–99)

## 2019-03-06 LAB — BASIC METABOLIC PANEL
Anion gap: 9 (ref 5–15)
BUN: 10 mg/dL (ref 6–20)
CO2: 25 mmol/L (ref 22–32)
Calcium: 8.6 mg/dL — ABNORMAL LOW (ref 8.9–10.3)
Chloride: 102 mmol/L (ref 98–111)
Creatinine, Ser: 0.39 mg/dL — ABNORMAL LOW (ref 0.44–1.00)
GFR calc Af Amer: >60 mL/min (ref >60)
GFR calc non Af Amer: >60 mL/min (ref >60)
Glucose, Bld: 263 mg/dL — ABNORMAL HIGH (ref 70–99)
Potassium: 3.8 mmol/L (ref 3.5–5.1)
Sodium: 136 mmol/L (ref 135–145)

## 2019-03-06 MED ORDER — PREDNISONE 20 MG PO TABS: 30.0000 mg | ORAL_TABLET | Freq: Every day | ORAL | Status: DC

## 2019-03-06 MED ORDER — PREDNISONE 20 MG PO TABS: 20.0000 mg | ORAL_TABLET | Freq: Every day | ORAL | Status: DC

## 2019-03-06 MED ORDER — LEVOTHYROXINE SODIUM 50 MCG PO TABS
75.0000 ug | ORAL_TABLET | Freq: Every day | ORAL | Status: DC
  Administered 2019-03-07: 06:00:00 75 ug via ORAL
  Filled 2019-03-06: qty 1

## 2019-03-06 MED ORDER — PREDNISONE 50 MG PO TABS
50.0000 mg | ORAL_TABLET | Freq: Every day | ORAL | Status: AC
  Administered 2019-03-07: 50 mg via ORAL
  Filled 2019-03-06: qty 1

## 2019-03-06 MED ORDER — FLUOXETINE HCL 20 MG PO CAPS
40.0000 mg | ORAL_CAPSULE | Freq: Every day | ORAL | Status: DC
  Administered 2019-03-06 – 2019-03-07 (×2): 40 mg via ORAL
  Filled 2019-03-06 (×2): qty 2

## 2019-03-06 MED ORDER — PREDNISONE 20 MG PO TABS: 40.0000 mg | ORAL_TABLET | Freq: Every day | ORAL | Status: DC

## 2019-03-06 MED ORDER — PREDNISONE 50 MG PO TABS: 75.0000 mg | ORAL_TABLET | Freq: Once | ORAL | Status: DC

## 2019-03-06 MED ORDER — FAMOTIDINE 20 MG PO TABS
20.0000 mg | ORAL_TABLET | Freq: Two times a day (BID) | ORAL | Status: DC
  Administered 2019-03-06 – 2019-03-07 (×2): 20 mg via ORAL
  Filled 2019-03-06 (×2): qty 1

## 2019-03-06 MED ORDER — PREDNISONE 10 MG PO TABS: 10.0000 mg | ORAL_TABLET | Freq: Every day | ORAL | Status: DC

## 2019-03-06 MED ORDER — METHYLPREDNISOLONE SODIUM SUCC 125 MG IJ SOLR
60.0000 mg | Freq: Two times a day (BID) | INTRAMUSCULAR | Status: AC
  Administered 2019-03-06: 60 mg via INTRAVENOUS
  Filled 2019-03-06: qty 2

## 2019-03-06 MED ORDER — FLUTICASONE PROPIONATE 50 MCG/ACT NA SUSP
1.0000 | Freq: Every day | NASAL | Status: DC | PRN
  Filled 2019-03-06: qty 16

## 2019-03-06 MED ORDER — POLYVINYL ALCOHOL 1.4 % OP SOLN
1.0000 [drp] | OPHTHALMIC | Status: DC | PRN
  Filled 2019-03-06: qty 15

## 2019-03-06 MED ORDER — BUPROPION HCL ER (XL) 150 MG PO TB24
150.0000 mg | ORAL_TABLET | Freq: Every day | ORAL | Status: DC
  Administered 2019-03-06 – 2019-03-07 (×2): 150 mg via ORAL
  Filled 2019-03-06 (×2): qty 1

## 2019-03-06 MED ORDER — DIPHENHYDRAMINE HCL 25 MG PO CAPS: 25.0000 mg | ORAL_CAPSULE | Freq: Four times a day (QID) | ORAL | Status: DC | PRN

## 2019-03-06 MED ORDER — LISINOPRIL 20 MG PO TABS
20.0000 mg | ORAL_TABLET | Freq: Every day | ORAL | Status: DC
  Administered 2019-03-06 – 2019-03-07 (×2): 20 mg via ORAL
  Filled 2019-03-06 (×2): qty 1

## 2019-03-06 NOTE — Progress Notes (Signed)
Inpatient Diabetes Program Recommendations  AACE/ADA: New Consensus Statement on Inpatient Glycemic Control   Target Ranges:  Prepandial:   less than 140 mg/dL      Peak postprandial:   less than 180 mg/dL (1-2 hours)      Critically ill patients:  140 - 180 mg/dL  Results for Krystal Nelson, Krystal Nelson (MRN [id number]) as of 03/06/2019 07:33  Ref. Range 03/06/2019 03:21  Glucose Latest Ref Range: 70 - 99 mg/dL 263 (H)   Results for Krystal Nelson, Krystal Nelson (MRN [id number]) as of 03/06/2019 07:33  Ref. Range 03/05/2019 00:01 03/05/2019 04:10 03/05/2019 07:48 03/05/2019 11:52 03/05/2019 16:38  Glucose-Capillary Latest Ref Range: 70 - 99 mg/dL 294 (H) 238 (H) 195 (H) 252 (H) 301 (H)  Results for Krystal Nelson, Krystal Nelson (MRN [id number]) as of 03/06/2019 07:33  Ref. Range 03/04/2019 04:15  Hemoglobin A1C Latest Ref Range: 4.8 - 5.6 % 9.9 (H)   Review of Glycemic Control  Diabetes history:DM2 Outpatient Diabetes medications:Basaglar 42 units QPM, Glipizide 5 mg BID Current orders for Inpatient glycemic control: Lantus 42 units daily, Novolog 0-20 units TID with meals, Novolog 0-5 units QHS; Solumedrol 60 mg Q6H  Inpatient Diabetes Program Recommendations:  Insulin-Meal Coverage: If steroids are continued, please consider ordering Novolog 5 units TID with meals for meal coverage if patient eats at least 50% of meals.  A1C:A1C 9.9% on 03/04/19 indicating an average glucose of 237 mg/dl over the past 2-3 months. Patient reports that her glucometer is several years old and she requested Rx for new glucometer. Patient also states that she has very little DM medications at home and requested Rx for DM medications as well. At time of discharge please provide Rx for: glucometer, Basaglar insulin pens, insulin pen needles, and Glipizide.   Thanks, Barnie Alderman, RN, MSN, CDE Diabetes Coordinator Inpatient Diabetes Program [phone] (Team Pager from 8am to 5pm)

## 2019-03-06 NOTE — Progress Notes (Signed)
PROGRESS NOTE    Krystal Nelson  VQX:[id number] DOB: September 19, 1965 DOA: 03/03/2019 PCP: [facility]    Brief Narrative:   Krystal Nelson a 53 y.o.femalewith medical history significant forinsulin-dependent diabetes, hypertension, hypothyroid, and psoriatic arthritis who presents to the ED with worsening rash for the last 48 to 72 hours. Patient reports she started Macrobid for UTI approximately 1 week ago and noticed an onset of rash Friday evening. Became progressively worse, it isnonpurulent and macular in nature, got progressively worse since last night, reports is itchy,.. As a result patient did not take her insulin for the last 2 days she cannot explain why other than stating "I got out of sync". In the ER patient was noted to have hyperglycemia greater than 500 with a gap of 20 bicarb of 19. She was placed on insulin drip, and consult in the hospital service  Interim History: -switched IV Solu-Medrol, Benadryl and Pepcid to oral  Subjective: -Patient allergic rash has improved.  Denies chest pain, shortness of breath, some nausea but no vomiting, diarrhea or abdominal pain.  No fever or chills.  Assessment & Plan:   Principal Problem:   Allergic reaction Active Problems:   DKA (diabetic ketoacidoses) (HCC)   HTN (hypertension)   Hypothyroid   Allergic reaction: Patient is on IV Solu-Medrol, Benadryl, Pepcid.  Allergic reaction with rash has slowly improved. There is no airway compromise, no purulence, no hypotension  -will switch IV meds to oral, prepare for discharge tomorrow -oral prednisone taper -As needed Benadryl -Pepcid 20 mg twice daily  DKA uncontrolled hyperglycemia: resolved. A1C 9.9. Weaned off insulin drip -Restarted Lantus 42 daily DM coordinator consulted Sliding scale insulin Diabetic diet  Hypothyroid  continue patient's home medications  Hypertension  -continue patient's home medication blood pressure, lisinopril   DVT  prophylaxis: heparin Code Status: full Family Communication: none, not at bedside Disposition Plan: most likely d/c tomorrow Barriers for discharge:    Consultants:   Diabetic educator  Procedures:    Antimicrobials:      Objective: Vitals:   03/05/19 1847 03/05/19 2151 03/06/19 0325 03/06/19 1613  BP: (!) 142/76 127/81 120/73 (!) 156/81  Pulse: 86 81 72 65  Resp: 18   17  Temp: 98.4 F (36.9 C) (!) 97.5 F (36.4 C) 97.6 F (36.4 C) 98.4 F (36.9 C)  TempSrc:  Oral Oral Oral  SpO2: 98% 97% 95% 98%  Weight:      Height:        Intake/Output Summary (Last 24 hours) at 03/06/2019 1832 Last data filed at 03/06/2019 1707 Gross per 24 hour  Intake 2886.19 ml  Output -  Net 2886.19 ml   Filed Weights   03/03/19 1204 03/04/19 1157  Weight: 81.6 kg 81.6 kg    Examination:  Physical Exam:  General: Not in acute distress HEENT: PERRL, EOMI, no scleral icterus, No JVD or bruit Cardiac: S1/S2, RRR, No murmurs, gallops or rubs Pulm: No rales, wheezing, rhonchi or rubs. Abd: Soft, nondistended, nontender, no rebound pain, no organomegaly, BS present Ext: No edema. 2+DP/PT pulse bilaterally Musculoskeletal: No joint deformities, erythema, or stiffness, ROM full Skin: has scattered rash rashes in whole body Neuro: Alert and oriented X3, cranial nerves II-XII grossly intact, moves all extremeties normally.  Psych: Patient is not psychotic, no suicidal or hemocidal ideation.    Data Reviewed: I have personally reviewed following labs and imaging studies  CBC: Recent Labs  Lab 03/03/19 1418  WBC 14.4*  HGB  17.1*  HCT 52.3*  MCV 84.1  PLT 383   Basic Metabolic Panel: Recent Labs  Lab 03/03/19 1418 03/04/19 0415 03/04/19 2233 03/05/19 0416 03/06/19 0321  NA 127* 132* 134* 131* 136  K 4.8 4.2 3.9 3.5 3.8  CL 90* 100 102 102 102  CO2 17* 21* 22 23 25   GLUCOSE 594* 167* 315* 242* 263*  BUN 25* 16 12 9 10   CREATININE 0.98 0.38*  0.43* 0.58 0.39*  0.39*  CALCIUM 9.1 8.1* 8.0* 7.7* 8.6*   GFR: Estimated Creatinine Clearance: 84.2 mL/min (A) (by C-G formula based on SCr of 0.39 mg/dL (L)). Liver Function Tests: Recent Labs  Lab 03/03/19 1418  AST 20  ALT 24  ALKPHOS 79  BILITOT 2.0*  PROT 7.2  ALBUMIN 3.8   No results for input(s): LIPASE, AMYLASE in the last 168 hours. No results for input(s): AMMONIA in the last 168 hours. Coagulation Profile: No results for input(s): INR, PROTIME in the last 168 hours. Cardiac Enzymes: No results for input(s): CKTOTAL, CKMB, CKMBINDEX, TROPONINI in the last 168 hours. BNP (last 3 results) No results for input(s): PROBNP in the last 8760 hours. HbA1C: Recent Labs    03/04/19 0415  HGBA1C 9.9*   CBG: Recent Labs  Lab 03/05/19 2358 03/06/19 0327 03/06/19 0818 03/06/19 1213 03/06/19 1650  GLUCAP 274* 219* 350* 328* 273*   Lipid Profile: No results for input(s): CHOL, HDL, LDLCALC, TRIG, CHOLHDL, LDLDIRECT in the last 72 hours. Thyroid Function Tests: No results for input(s): TSH, T4TOTAL, FREET4, T3FREE, THYROIDAB in the last 72 hours. Anemia Panel: No results for input(s): VITAMINB12, FOLATE, FERRITIN, TIBC, IRON, RETICCTPCT in the last 72 hours. Sepsis Labs: No results for input(s): PROCALCITON, LATICACIDVEN in the last 168 hours.  Recent Results (from the past 240 hour(s))  SARS CORONAVIRUS 2 (TAT 6-24 HRS) Nasopharyngeal Nasopharyngeal Swab     Status: None   Collection Time: 03/03/19  5:40 PM   Specimen: Nasopharyngeal Swab  Result Value Ref Range Status   SARS Coronavirus 2 NEGATIVE NEGATIVE Final    Comment: (NOTE) SARS-CoV-2 target nucleic acids are NOT DETECTED. The SARS-CoV-2 RNA is generally detectable in upper and lower respiratory specimens during the acute phase of infection. Negative results do not preclude SARS-CoV-2 infection, do not rule out co-infections with other pathogens, and should not be used as the sole basis for treatment or other patient  management decisions. Negative results must be combined with clinical observations, patient history, and epidemiological information. The expected result is Negative. Fact Sheet for Patients: HairSlick.nohttps://www.fda.gov/media/138098/download Fact Sheet for Healthcare Providers: quierodirigir.comhttps://www.fda.gov/media/138095/download This test is not yet approved or cleared by the [place] FDA and  has been authorized for detection and/or diagnosis of SARS-CoV-2 by FDA under an Emergency Use Authorization (EUA). This EUA will remain  in effect (meaning this test can be used) for the duration of the COVID-19 declaration under Section 56 4(b)(1) of the Act, 21 U.S.C. section 360bbb-3(b)(1), unless the authorization is terminated or revoked sooner. Performed at [facility], [address] [place], [place] [address]         Radiology Studies: No results found.      Scheduled Meds: . buPROPion  150 mg Oral Daily  . famotidine  20 mg Oral BID  . FLUoxetine  40 mg Oral Daily  . fluticasone  1-2 spray Each Nare Daily  . heparin  5,000 Units Subcutaneous Q8H  . insulin aspart  0-20 Units Subcutaneous TID WC  . insulin aspart  0-5 Units Subcutaneous QHS  . insulin glargine  42 Units Subcutaneous Daily  . [START ON 03/07/2019] levothyroxine  75 mcg Oral QAC breakfast  . lisinopril  20 mg Oral Daily  . methylPREDNISolone (SOLU-MEDROL) injection  60 mg Intravenous Q12H  . [START ON 03/07/2019] predniSONE  50 mg Oral Q breakfast   Followed by  . [START ON 03/08/2019] predniSONE  40 mg Oral Q breakfast   Followed by  . [START ON 03/09/2019] predniSONE  30 mg Oral Q breakfast   Followed by  . [START ON 03/10/2019] predniSONE  20 mg Oral Q breakfast   Followed by  . [START ON 03/11/2019] predniSONE  10 mg Oral Q breakfast   Continuous Infusions:    LOS: 3 days    Time spent: 30 min     Ivor Costa, DO [facility] PAGER is on AMION  If 7PM-7AM, please contact  night-coverage [email or web address] Password [facility] 03/06/2019, 6:32 PM

## 2019-03-07 LAB — BASIC METABOLIC PANEL
Anion gap: 10 (ref 5–15)
BUN: 18 mg/dL (ref 6–20)
CO2: 25 mmol/L (ref 22–32)
Calcium: 8.9 mg/dL (ref 8.9–10.3)
Chloride: 101 mmol/L (ref 98–111)
Creatinine, Ser: 0.5 mg/dL (ref 0.44–1.00)
GFR calc Af Amer: >60 mL/min (ref >60)
GFR calc non Af Amer: >60 mL/min (ref >60)
Glucose, Bld: 354 mg/dL — ABNORMAL HIGH (ref 70–99)
Potassium: 4.4 mmol/L (ref 3.5–5.1)
Sodium: 136 mmol/L (ref 135–145)

## 2019-03-07 LAB — CBC
HCT: 35.3 % — ABNORMAL LOW (ref 36.0–46.0)
Hemoglobin: 11.9 g/dL — ABNORMAL LOW (ref 12.0–15.0)
MCH: 27.7 pg (ref 26.0–34.0)
MCHC: 33.7 g/dL (ref 30.0–36.0)
MCV: 82.3 fL (ref 80.0–100.0)
Platelets: 235 10*3/uL (ref 150–400)
RBC: 4.29 MIL/uL (ref 3.87–5.11)
RDW: 12.9 % (ref 11.5–15.5)
WBC: 10.6 10*3/uL — ABNORMAL HIGH (ref 4.0–10.5)
nRBC: 0 % (ref 0.0–0.2)

## 2019-03-07 LAB — GLUCOSE, CAPILLARY
Glucose-Capillary: 267 mg/dL — ABNORMAL HIGH (ref 70–99)
Glucose-Capillary: 309 mg/dL — ABNORMAL HIGH (ref 70–99)
Glucose-Capillary: 316 mg/dL — ABNORMAL HIGH (ref 70–99)

## 2019-03-07 MED ORDER — NOVOLOG FLEXPEN 100 UNIT/ML ~~LOC~~ SOPN: 5.0000 [IU] | PEN_INJECTOR | Freq: Three times a day (TID) | SUBCUTANEOUS | 3 refills | Status: DC

## 2019-03-07 MED ORDER — DIPHENHYDRAMINE HCL 25 MG PO CAPS: 25.0000 mg | ORAL_CAPSULE | Freq: Four times a day (QID) | ORAL | 0 refills | Status: DC | PRN

## 2019-03-07 MED ORDER — "PEN NEEDLES 3/16"" 31G X 5 MM MISC": 1.0000 | Freq: Three times a day (TID) | 1 refills | Status: DC

## 2019-03-07 MED ORDER — PREDNISONE 10 MG PO TABS: 10.0000 mg | ORAL_TABLET | Freq: Every day | ORAL | 0 refills | Status: DC

## 2019-03-07 MED ORDER — FAMOTIDINE 20 MG PO TABS: 20.0000 mg | ORAL_TABLET | Freq: Two times a day (BID) | ORAL | 0 refills | Status: DC

## 2019-03-07 NOTE — Discharge Summary (Addendum)
Physician Discharge Summary  Krystal BreedingLaura D Bensen ZOX:[id number] DOB: 1965-11-03 DOA: 03/03/2019  PCP: [facility]  Admit date: 03/03/2019 Discharge date: 03/07/2019  Recommendations for Outpatient Follow-up:  1. Follow up with PCP in 7-10 days 2. Please obtain BMP/CBC in one week and check blood sugar   Home Health: none Equipment/Devices: none  Discharge Condition: stable CODE STATUS: full Diet recommendation: carb modified diet  Brief/Interim Summary (HPI)  Krystal BreedingLaura D Nelson is a 53 y.o. female with medical history significant for insulin-dependent diabetes, hypertension, hypothyroid, and psoriatic arthritis who presents to the ED with worsening rash for the last 48 to 72 hours.  Patient reports she started Macrobid for UTI approximately 1 week ago and noticed an onset of rash Friday evening.  Became progressively worse, it is nonpurulent and macular in nature, got progressively worse since last night, reports is itchy,..  As a result patient did not take her insulin for the last 2 days she cannot explain why other than stating "I got out of sync".  In the ER patient was noted to have hyperglycemia greater than 500 with a gap of 20 bicarb of 19.  She was placed on insulin drip, and consult in the hospital service  ED Course: Patient was placed on insulin drip, received Benadryl, Pepcid, Vistaril, fluids, BMP shows a sodium of 127 in the setting of a glucose of 594 potassium 4.8 bicarb 17 and gap of 20 and a stress-induced demargination with a white count of 14  Subjective  Pt still has a few scattered rashes. No chest pain, shortness of breath, cough, no fever or chills.  No nausea vomiting, diarrhea or abdominal pain.   Discharge Diagnoses and Hospital Course:   Principal Problem:   Allergic reaction Active Problems:   DKA (diabetic ketoacidoses) (HCC)   HTN (hypertension)   Hypothyroid  Allergic reaction: Patient was treated with IV Solu-Medrol, Benadryl, Pepcid.   Allergic reaction with rash has slowly improved. There is no airway compromise, no purulence, no hypotension  -will switched oral meds at discharge tomorrow -oral prednisone taper -As needed Benadryl -Pepcid 20 mg twice daily  DKAuncontrolled hyperglycemia: resolved. A1C 9.9. Weaned off insulin drip. Will let pt take 5 Units of novolog tid with meal when pt is taking prednsione, and continue home Insulin Glargine 42 units daily  Hypothyroid  continue patient's home medications  Hypertension  -continue patient's home medication blood pressure, lisinopril   Discharge Instructions  Discharge Instructions    Call MD for:  hives   Complete by: As directed    Call MD for:  redness, tenderness, or signs of infection (pain, swelling, redness, odor or green/yellow discharge around incision site)   Complete by: As directed    Call MD for:  severe uncontrolled pain   Complete by: As directed    Call MD for:  temperature >100.4   Complete by: As directed    Diet Carb Modified   Complete by: As directed    Increase activity slowly   Complete by: As directed      Allergies as of 03/07/2019      Reactions   Macrobid [nitrofurantoin Macrocrystal] Rash   Large red welts all over her body      Medication List    TAKE these medications   Basaglar KwikPen 100 UNIT/ML Sopn Inject 42 Units into the skin every evening.   buPROPion 150 MG 24 hr tablet Commonly known as: WELLBUTRIN XL Take 150 mg by mouth daily.   Cosentyx Sensoready  Pen 150 MG/ML Soaj Generic drug: Secukinumab Inject 150 mg into the skin every 28 (twenty-eight) days.   diphenhydrAMINE 25 mg capsule Commonly known as: BENADRYL Take 1 capsule (25 mg total) by mouth every 6 (six) hours as needed for itching or allergies.   famotidine 20 MG tablet Commonly known as: PEPCID Take 1 tablet (20 mg total) by mouth 2 (two) times daily.   Fluocinolone Acetonide 0.01 % Oil Apply 1 application topically daily.    FLUoxetine 40 MG capsule Commonly known as: PROZAC Take 40 mg by mouth daily.   fluticasone 50 MCG/ACT nasal spray Commonly known as: FLONASE Place 1-2 sprays into both nostrils daily.   glipiZIDE 5 MG tablet Commonly known as: GLUCOTROL Take 5 mg by mouth 2 (two) times daily before a meal.   ibuprofen 200 MG tablet Commonly known as: ADVIL Take 200 mg by mouth every 6 (six) hours as needed for pain.   levothyroxine 75 MCG tablet Commonly known as: SYNTHROID Take 75 mcg by mouth daily before breakfast.   lisinopril 20 MG tablet Commonly known as: ZESTRIL Take 20 mg by mouth daily.   NovoLOG FlexPen 100 UNIT/ML FlexPen Generic drug: insulin aspart Inject 5 Units into the skin 3 (three) times daily with meals.   Pen Needles 3/16" 31G X 5 MM Misc 1 each by Does not apply route 3 (three) times daily.   predniSONE 10 MG tablet Commonly known as: DELTASONE Take 1-4 tablets (10-40 mg total) by mouth daily with breakfast.      Follow-up Information    [facility] Follow up in 1 week(s).   Contact information: [address] [place] [address] [phone]          Allergies  Allergen Reactions  . Macrobid [Nitrofurantoin Macrocrystal] Rash    Large red welts all over her body    Consultations:  diabetic educator   Procedures/Studies: No results found.    Discharge Exam: Vitals:   03/06/19 1938 03/07/19 0515  BP: (!) 138/56 (!) 148/75  Pulse: (!) 57 (!) 52  Resp: 16 17  Temp: 98.1 F (36.7 C) 98.3 F (36.8 C)  SpO2: 98% 97%   Vitals:   03/06/19 0325 03/06/19 1613 03/06/19 1938 03/07/19 0515  BP: 120/73 (!) 156/81 (!) 138/56 (!) 148/75  Pulse: 72 65 (!) 57 (!) 52  Resp:  Temp: 97.6 F (36.4 C) 98.4 F (36.9 C) 98.1 F (36.7 C) 98.3 F (36.8 C)  TempSrc: Oral Oral Oral   SpO2: 95% 98% 98% 97%  Weight:      Height:        General: Pt is alert, awake, not in acute distress Cardiovascular: RRR,  S1/S2 +, no rubs, no gallops Respiratory: CTA bilaterally, no wheezing, no rhonchi Abdominal: Soft, NT, ND, bowel sounds + Extremities: no edema, no cyanosis Skin: has scattered rashes    The results of significant diagnostics from this hospitalization (including imaging, microbiology, ancillary and laboratory) are listed below for reference.     Microbiology: Recent Results (from the past 240 hour(s))  SARS CORONAVIRUS 2 (TAT 6-24 HRS) Nasopharyngeal Nasopharyngeal Swab     Status: None   Collection Time: 03/03/19  5:40 PM   Specimen: Nasopharyngeal Swab  Result Value Ref Range Status   SARS Coronavirus 2 NEGATIVE NEGATIVE Final    Comment: (NOTE) SARS-CoV-2 target nucleic acids are NOT DETECTED. The SARS-CoV-2 RNA is generally detectable in upper and lower respiratory specimens during  the acute phase of infection. Negative results do not preclude SARS-CoV-2 infection, do not rule out co-infections with other pathogens, and should not be used as the sole basis for treatment or other patient management decisions. Negative results must be combined with clinical observations, patient history, and epidemiological information. The expected result is Negative. Fact Sheet for Patients: SugarRoll.be Fact Sheet for Healthcare Providers: https://www.woods-mathews.com/ This test is not yet approved or cleared by the [place] FDA and  has been authorized for detection and/or diagnosis of SARS-CoV-2 by FDA under an Emergency Use Authorization (EUA). This EUA will remain  in effect (meaning this test can be used) for the duration of the COVID-19 declaration under Section 56 4(b)(1) of the Act, 21 U.S.C. section 360bbb-3(b)(1), unless the authorization is terminated or revoked sooner. Performed at [facility], [place] [address] [place], [place] [address]      Labs: BNP (last 3 results) No results for input(s): BNP in the last 8760  hours. Basic Metabolic Panel: Recent Labs  Lab 03/04/19 0415 03/04/19 2233 03/05/19 0416 03/06/19 0321 03/07/19 0348  NA 132* 134* 131* 136 136  K 4.2 3.9 3.5 3.8 4.4  CL 100 102 102 102 101  CO2 21* 22 23 25 25   GLUCOSE 167* 315* 242* 263* 354*  BUN 16 12 9 10 18   CREATININE 0.38*  0.43* 0.58 0.39* 0.39* 0.50  CALCIUM 8.1* 8.0* 7.7* 8.6* 8.9   Liver Function Tests: Recent Labs  Lab 03/03/19 1418  AST 20  ALT 24  ALKPHOS 79  BILITOT 2.0*  PROT 7.2  ALBUMIN 3.8   No results for input(s): LIPASE, AMYLASE in the last 168 hours. No results for input(s): AMMONIA in the last 168 hours. CBC: Recent Labs  Lab 03/03/19 1418 03/07/19 0348  WBC 14.4* 10.6*  HGB 17.1* 11.9*  HCT 52.3* 35.3*  MCV 84.1 82.3  PLT 383 235   Cardiac Enzymes: No results for input(s): CKTOTAL, CKMB, CKMBINDEX, TROPONINI in the last 168 hours. BNP: Invalid input(s): POCBNP CBG: Recent Labs  Lab 03/06/19 1213 03/06/19 1650 03/06/19 2022 03/07/19 0745 03/07/19 1149  GLUCAP 328* 273* 267* 309* 316*   D-Dimer No results for input(s): DDIMER in the last 72 hours. Hgb A1c No results for input(s): HGBA1C in the last 72 hours. Lipid Profile No results for input(s): CHOL, HDL, LDLCALC, TRIG, CHOLHDL, LDLDIRECT in the last 72 hours. Thyroid function studies No results for input(s): TSH, T4TOTAL, T3FREE, THYROIDAB in the last 72 hours.  Invalid input(s): FREET3 Anemia work up No results for input(s): VITAMINB12, FOLATE, FERRITIN, TIBC, IRON, RETICCTPCT in the last 72 hours. Urinalysis    Component Value Date/Time   COLORURINE Yellow 03/04/2014 0948   APPEARANCEUR Clear 03/04/2014 0948   LABSPEC 1.025 03/04/2014 0948   PHURINE 6.0 03/04/2014 0948   GLUCOSEU Negative 03/04/2014 0948   HGBUR Negative 03/04/2014 0948   BILIRUBINUR Negative 03/04/2014 0948   KETONESUR Negative 03/04/2014 0948   PROTEINUR Negative 03/04/2014 0948   NITRITE Negative 03/04/2014 0948   LEUKOCYTESUR Negative  03/04/2014 0948   Sepsis Labs Invalid input(s): PROCALCITONIN,  WBC,  LACTICIDVEN Microbiology Recent Results (from the past 240 hour(s))  SARS CORONAVIRUS 2 (TAT 6-24 HRS) Nasopharyngeal Nasopharyngeal Swab     Status: None   Collection Time: 03/03/19  5:40 PM   Specimen: Nasopharyngeal Swab  Result Value Ref Range Status   SARS Coronavirus 2 NEGATIVE NEGATIVE Final    Comment: (NOTE) SARS-CoV-2 target nucleic acids are NOT DETECTED. The SARS-CoV-2 RNA  is generally detectable in upper and lower respiratory specimens during the acute phase of infection. Negative results do not preclude SARS-CoV-2 infection, do not rule out co-infections with other pathogens, and should not be used as the sole basis for treatment or other patient management decisions. Negative results must be combined with clinical observations, patient history, and epidemiological information. The expected result is Negative. Fact Sheet for Patients: HairSlick.no Fact Sheet for Healthcare Providers: quierodirigir.com This test is not yet approved or cleared by the [place] FDA and  has been authorized for detection and/or diagnosis of SARS-CoV-2 by FDA under an Emergency Use Authorization (EUA). This EUA will remain  in effect (meaning this test can be used) for the duration of the COVID-19 declaration under Section 56 4(b)(1) of the Act, 21 U.S.C. section 360bbb-3(b)(1), unless the authorization is terminated or revoked sooner. Performed at [facility], [address] [place], [place] [address]     Time coordinating discharge:  35 minutes.   SIGNED:  Lorretta Harp, DO [facility] 03/07/2019, 1:44 PM Pager is on AMION  If 7PM-7AM, please contact night-coverage [email or web address] Password [email or web address]

## 2019-03-07 NOTE — Progress Notes (Signed)
Inpatient Diabetes Program Recommendations  AACE/ADA: New Consensus Statement on Inpatient Glycemic Control   Target Ranges:  Prepandial:   less than 140 mg/dL      Peak postprandial:   less than 180 mg/dL (1-2 hours)      Critically ill patients:  140 - 180 mg/dL  Results for [name], [name] (MRN [id number]) as of 03/07/2019 07:37  Ref. Range 03/07/2019 03:48  Glucose Latest Ref Range: 70 - 99 mg/dL 354 (H)   Results for [name], [name] (MRN [id number]) as of 03/07/2019 07:37  Ref. Range 03/05/2019 07:48 03/05/2019 11:52 03/05/2019 16:38 03/05/2019 21:50 03/05/2019 23:58 03/06/2019 03:27 03/06/2019 08:18 03/06/2019 12:13 03/06/2019 16:50  Glucose-Capillary Latest Ref Range: 70 - 99 mg/dL 195 (H) 252 (H) 301 (H) 334 (H) 274 (H) 219 (H) 350 (H) 328 (H) 273 (H)   Review of Glycemic Control  Diabetes history:DM2 Outpatient Diabetes medications:Basaglar 42 units QPM, Glipizide 5 mg BID Current orders for Inpatient glycemic control:Lantus 42 units daily,Novolog 0-20 unitsTID with meals, Novolog 0-5 units QHS; Prednisone taper  Inpatient Diabetes Program Recommendations:  Insulin-Meal Coverage: If steroids are continued, please consider ordering Novolog 8 units TID with meals for meal coverage if patient eats at least 50% of meals.  A1C:A1C9.9% on 11/16/20indicating an average glucose of237 mg/dl over the past 2-3 months.Patient reportsthat herglucometer is several years oldand she requested Rx for new glucometer. Patient alsostates that she has very little DM medications at home and requested Rx for DM medications as well. At time of discharge please provide Rx for: glucometer, Basaglar insulin pens, insulin pen needles, and Glipizide.   Thanks, Barnie Alderman, RN, MSN, CDE Diabetes Coordinator Inpatient Diabetes Program [phone] (Team Pager from 8am to 5pm)

## 2019-03-07 NOTE — Progress Notes (Signed)
Discharge instructions given and went over with patient at bedside. All questions answered. Patient discharged home. Meiah Zamudio S, RN  

## 2019-03-07 NOTE — Discharge Instructions (Signed)
You were cared for by a hospitalist during your hospital stay. If you have any questions about your discharge medications or the care you received while you were in the hospital after you are discharged, you can call the unit and ask to speak with the hospitalist on call if the hospitalist that took care of you is not available. Once you are discharged, your primary care physician will handle any further medical issues. Please note that NO REFILLS for any discharge medications will be authorized once you are discharged, as it is imperative that you return to your primary care physician (or establish a relationship with a primary care physician if you do not have one) for your aftercare needs so that they can reassess your need for medications and monitor your lab values. ° °Follow up with PCP. Take all medications as prescribed. If symptoms change or worsen please return to the ED for evaluation  ° °

## 2019-03-07 NOTE — Progress Notes (Signed)
CBG of 309

## 2019-03-11 MED ORDER — LANCETS
Freq: Every day | 3 refills | 0.00000 days | Status: CP
Start: 2019-03-11 — End: ?

## 2019-03-11 MED ORDER — BLOOD-GLUCOSE METER KIT WRAPPER
0 refills | 0 days | Status: CP
Start: 2019-03-11 — End: 2020-03-10

## 2019-03-11 MED ORDER — FREESTYLE LITE STRIPS
ORAL_STRIP | Freq: Every day | 3 refills | 0.00000 days | Status: CP
Start: 2019-03-11 — End: 2019-06-09

## 2019-03-27 DIAGNOSIS — Z1231 Encounter for screening mammogram for malignant neoplasm of breast: Principal | ICD-10-CM

## 2019-03-28 ENCOUNTER — Encounter: Admit: 2019-03-28 | Discharge: 2019-03-29 | Payer: PRIVATE HEALTH INSURANCE

## 2019-04-24 ENCOUNTER — Ambulatory Visit: Admit: 2019-04-24 | Discharge: 2019-04-25 | Payer: PRIVATE HEALTH INSURANCE

## 2019-04-24 DIAGNOSIS — E119 Type 2 diabetes mellitus without complications: Principal | ICD-10-CM

## 2019-04-24 DIAGNOSIS — Z794 Long term (current) use of insulin: Secondary | ICD-10-CM

## 2019-04-24 DIAGNOSIS — L299 Pruritus, unspecified: Principal | ICD-10-CM

## 2019-04-24 DIAGNOSIS — F329 Major depressive disorder, single episode, unspecified: Principal | ICD-10-CM

## 2019-04-24 DIAGNOSIS — I10 Essential (primary) hypertension: Principal | ICD-10-CM

## 2019-04-24 MED ORDER — BUPROPION HCL XL 150 MG 24 HR TABLET, EXTENDED RELEASE
ORAL_TABLET | Freq: Every morning | ORAL | 0 refills | 90.00000 days | Status: CP
Start: 2019-04-24 — End: ?

## 2019-04-26 DIAGNOSIS — L4050 Arthropathic psoriasis, unspecified: Principal | ICD-10-CM

## 2019-04-26 MED ORDER — MOMETASONE 0.1 % TOPICAL SOLUTION
0 refills | 0 days | Status: CP
Start: 2019-04-26 — End: ?

## 2019-04-26 MED ORDER — SECUKINUMAB 150 MG/ML SUBCUTANEOUS PEN INJECTOR
INJECTION | SUBCUTANEOUS | 3 refills | 0 days | Status: CP
Start: 2019-04-26 — End: ?

## 2019-04-29 MED ORDER — SHINGRIX (PF) 50 MCG/0.5 ML INTRAMUSCULAR SUSPENSION, KIT
Freq: Once | INTRAMUSCULAR | 1 refills | 1.00000 days | Status: CP
Start: 2019-04-29 — End: 2019-04-29

## 2019-04-30 ENCOUNTER — Ambulatory Visit: Admit: 2019-04-30 | Discharge: 2019-05-01 | Payer: PRIVATE HEALTH INSURANCE

## 2019-04-30 DIAGNOSIS — L814 Other melanin hyperpigmentation: Principal | ICD-10-CM

## 2019-04-30 DIAGNOSIS — T50905D Adverse effect of unspecified drugs, medicaments and biological substances, subsequent encounter: Principal | ICD-10-CM

## 2019-04-30 DIAGNOSIS — L409 Psoriasis, unspecified: Principal | ICD-10-CM

## 2019-04-30 DIAGNOSIS — L821 Other seborrheic keratosis: Principal | ICD-10-CM

## 2019-04-30 MED ORDER — FREESTYLE LITE STRIPS
ORAL_STRIP | Freq: Every day | 3 refills | 0 days | Status: CP
Start: 2019-04-30 — End: 2019-07-29

## 2019-04-30 MED ORDER — BLOOD-GLUCOSE METER KIT WRAPPER
0 refills | 0 days | Status: CP
Start: 2019-04-30 — End: 2020-04-25

## 2019-04-30 MED ORDER — CLOBETASOL 0.05 % SCALP SOLUTION
Freq: Two times a day (BID) | TOPICAL | 0 refills | 0.00000 days | Status: CP
Start: 2019-04-30 — End: ?

## 2019-04-30 MED ORDER — BASAGLAR 100 UNIT/ML (3 ML) SUBCUTANEOUS PEN
Freq: Every evening | SUBCUTANEOUS | 3 refills | 0 days | Status: CP
Start: 2019-04-30 — End: ?

## 2019-04-30 MED ORDER — TRIAMCINOLONE ACETONIDE 0.1 % TOPICAL OINTMENT
Freq: Two times a day (BID) | TOPICAL | 2 refills | 0.00000 days | Status: CP
Start: 2019-04-30 — End: 2020-04-29

## 2019-04-30 MED ORDER — BUPROPION HCL XL 150 MG 24 HR TABLET, EXTENDED RELEASE
ORAL_TABLET | Freq: Every morning | ORAL | 0 refills | 90 days | Status: CP
Start: 2019-04-30 — End: ?

## 2019-05-01 ENCOUNTER — Encounter: Admit: 2019-05-01 | Discharge: 2019-05-02 | Payer: PRIVATE HEALTH INSURANCE

## 2019-05-01 DIAGNOSIS — E119 Type 2 diabetes mellitus without complications: Principal | ICD-10-CM

## 2019-05-01 DIAGNOSIS — Z794 Long term (current) use of insulin: Secondary | ICD-10-CM

## 2019-05-01 DIAGNOSIS — I10 Essential (primary) hypertension: Principal | ICD-10-CM

## 2019-05-31 ENCOUNTER — Encounter: Admit: 2019-05-31 | Discharge: 2019-05-31 | Payer: PRIVATE HEALTH INSURANCE

## 2019-05-31 DIAGNOSIS — E119 Type 2 diabetes mellitus without complications: Principal | ICD-10-CM

## 2019-05-31 DIAGNOSIS — L4050 Arthropathic psoriasis, unspecified: Principal | ICD-10-CM

## 2019-05-31 MED ORDER — SECUKINUMAB 150 MG/ML SUBCUTANEOUS PEN INJECTOR
SUBCUTANEOUS | 3 refills | 84 days | Status: CP
Start: 2019-05-31 — End: ?
  Filled 2019-06-24: qty 2, 28d supply, fill #0

## 2019-06-13 NOTE — Unmapped (Signed)
[facility] Specialty Medication Onboarding    Specialty Medication: COSENTYX PENS 150MG /ML  Prior Authorization: Approved   Financial Assistance: Yes - copay card approved as secondary   Final Copay/Day Supply: $0 / 28 DAYS    Insurance Restrictions: Yes - max 1 month supply     Notes to Pharmacist: Copay card has virtual debit card to be obtained after billing.     The triage team has completed the benefits investigation and has determined that the patient is able to fill this medication at [facility]. Please contact the patient to complete the onboarding or follow up with the prescribing physician as needed.

## 2019-06-13 NOTE — Unmapped (Signed)
[facility]   Patient Onboarding/Medication Counseling    Julia Avila is a 54 y.o. female with psoriatic arthritis and psoriasis who I am counseling today on continuation of therapy.  I am speaking to the patient.    Was a Nurse, learning disability used for this call? No    Verified patient's date of birth / HIPAA.    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 Tuberculosis: Tuberculosis screening resulted in a non-reactive Quantiferon TB Gold assay.      Administration:     Prefilled Sensoready?? auto-injector pen  1. Gather all supplies needed for injection on a clean, flat working surface: medication pen removed from packaging, alcohol swab, sharps container, etc.  2. Look at the medication label ??? look for correct medication, correct dose, and check the expiration date  3. Look at the medication ??? the liquid visible in the window on the side of the pen device should appear clear and colorless to slightly yellow  4. Lay the auto-injector pen on a flat surface and allow it to warm up to room temperature for at least 15-30 minutes  5. Select injection site ??? you can use the front of your thigh or your belly (but not the area 2 inches around your belly button); if someone else is giving you the injection you can also use your upper arm in the skin covering your triceps muscle  6. Prepare injection site ??? wash your hands and clean the skin at the injection site with an alcohol swab and let it air dry, do not touch the injection site again before the injection  7. Twist off the purple safety cap in the direction of the arrow, do not remove until immediately prior to injection and do not touch the yellow needle cover  8. Put the white needle cover against your skin at the injection site at a 90 degree angle, hold the pen such that you can see the clear medication window  9. Press down and hold the pen firmly against your skin, there will be a click when the injection starts  10. Continue to hold the pen firmly against your skin for about 10-15 seconds ??? the window will start to turn solid green  11. There will be a second click sound when the injection is almost complete, verify the window is solid green to indicate the injection is complete and then pull the pen away from your skin  12. Dispose of the used auto-injector pen immediately in your sharps disposal container the needle will be covered automatically  13. If you see any blood at the injection site, press a cotton ball or gauze on the site and maintain pressure until the bleeding stops, do not rub the injection site      Adherence/Missed dose instructions:  If your injection is given more than 4 days after your scheduled injection date ??? consult your pharmacist for additional instructions on how to adjust your dosing schedule.        Goals of Therapy     ? Minimize areas of skin involvement (% BSA)  ? Avoidance of long term glucocorticoid use  ?  Achieve remission/inactive disease or low/minimal disease activity  ? Maintenance of function  ? Minimization of systemic manifestations and comorbidities  ? Maintenance of effective psychosocial functioning      Side Effects & Monitoring Parameters     ? Injection site reaction (redness, irritation, inflammation localized to the site of administration)  ? Signs of a common cold ??? minor sore throat, runny or stuffy nose, etc.  ? Diarrhea    The following side effects should be reported to the provider:  ? Signs of a hypersensitivity reaction ??? rash; hives; itching; red, swollen, blistered, or peeling skin; wheezing; tightness in the chest or throat; difficulty breathing, swallowing, or talking; swelling of the mouth, face, lips, tongue, or throat; etc.  ? Reduced immune function ??? report signs of infection such as fever; chills; body aches; very bad sore throat; ear or sinus pain; cough; more sputum or change in color of sputum; pain with passing urine; wound that will not heal, etc.  Also at a slightly higher risk of some malignancies (mainly skin and blood cancers) due to this reduced immune function.  o In the case of signs of infection ??? the patient should hold the next dose of Cosentyx?? and call your primary care provider to ensure adequate medical care.  Treatment may be resumed when infection is treated and patient is asymptomatic.  ? Muscle pain or weakness  ? Shortness of breath      Warnings, Precautions, & Contraindications     ? Have your bloodwork checked as you have been told by your prescriber  ? Talk with your doctor if you are pregnant, planning to become pregnant, or breastfeeding  ? Discuss the possible need for holding your dose(s) of Cosentyx?? when a planned procedure is scheduled with the prescriber as it may delay healing/recovery timeline       Drug/Food Interactions     ? Medication list reviewed in Epic. The patient was instructed to inform the care team before taking any new medications or supplements. No drug interactions identified.   ? If you have a latex allergy use caution when handling, the needle cap of the Cosentyx?? prefilled syringe and the safety cap for the Cosentyx Sensoready?? pen contains a derivative of natural rubber latex  ? Talk with you prescriber or pharmacist before receiving any live vaccinations while taking this medication and after you stop taking it      Storage, Handling Precautions, & Disposal     ? Store this medication in the refrigerator.  Do not freeze  ? If needed, you may store at room temperature for up to 1 hour  ? Store in [employer], protected from light  ? Do not shake  ? Dispose of used syringes/pens in a sharps disposal container    The patient declined counseling on medication administration, missed dose instructions, goals of therapy, side effects and monitoring parameters, warnings and precautions, drug/food interactions and storage, handling precautions, and disposal because they have taken the medication previously. The information in the declined sections above are for informational purposes only and was not discussed with patient.       Current Medications (including OTC/herbals), Comorbidities and Allergies     Current Outpatient Medications   Medication Sig Dispense Refill   ??? blood sugar diagnostic (GLUCOSE BLOOD) Strp Test blood sugars three times daily. ICD-10 E11.9 100 strip 11   ??? blood-glucose meter kit Use as instructed 1 each 0   ??? blood-glucose meter Misc Use to check blood  sugars three times daily. ICD10 E11.9 1 each 0   ??? buPROPion (WELLBUTRIN XL) 150 MG 24 hr tablet Take 1 tablet (150 mg total) by mouth every morning. restarted 90 tablet 0   ??? clobetasoL (TEMOVATE) 0.05 % external solution Apply topically Two (2) times a day. 50 mL 0   ??? FLUoxetine (PROZAC) 40 MG capsule TAKE 2 CAPSULES(80 MG) BY MOUTH DAILY 180 capsule 3   ??? FREESTYLE LITE STRIPS Strp by Other route once daily. Test daily before breakfast. Dispense 90 day supply. 100 strip 3   ??? glipiZIDE (GLUCOTROL) 5 MG tablet TAKE 1 TABLET(5 MG) BY MOUTH TWICE DAILY 60 tablet 11   ??? ibuprofen (ADVIL,MOTRIN) 200 MG tablet Take 200 mg by mouth every six (6) hours as needed for pain.     ??? inhalational spacing device Spcr Use with inhaler to maximize medication delivery to lungs 1 each 0   ??? insulin glargine (BASAGLAR) injection pen Inject 12 Units under the skin nightly. or as directed 45 mL 3   ??? insulin syringe-needle U-100 0.5 mL 31 gauge x 5/16 Syrg Injection Frequency is 1 time per day; Dx Code: Type 2 Diabetes uncontrolled (E11.65) 100 each 11   ??? lancets Misc 1 each by Other route once daily. Test daily before breakfast. Dispense 90 day supply. 100 each 3   ??? levothyroxine (SYNTHROID, LEVOTHROID) 75 MCG tablet Take 1 tablet (75 mcg total) by mouth daily. 90 tablet 2   ??? lisinopril (PRINIVIL,ZESTRIL) 20 MG tablet Take 1 tablet (20 mg total) by mouth daily. 90 tablet 3   ??? mometasone (ELOCON) 0.1 % lotion Apply with cotton ball to ear canals t bilaterally once every 2weeks 60 mL 0   ??? pen needle, diabetic (BD ULTRA-FINE NANO PEN NEEDLES) 32 gauge x 5/32 Ndle Needles for insulin pen. Inject daily as instructed. ICD-10 E11.9 30 each 0   ??? pravastatin (PRAVACHOL) 40 MG tablet Take 1 tablet (40 mg total) by mouth daily. 90 tablet 3   ??? secukinumab (COSENTYX) 150 mg/mL PnIj injection Inject the contents of 2 pens (300 mg total) under the skin every twenty-eight (28) days. 6 mL 3   ??? triamcinolone (KENALOG) 0.1 % ointment Apply topically Two (2) times a day. 80 g 2     No current facility-administered medications for this visit.        Allergies   Allergen Reactions   ??? Nitrofurantoin Macrocrystal Rash     Large red welts all over her body       Patient Active Problem List   Diagnosis   ??? Anemia   ??? Type 2 diabetes mellitus with microalbuminuria, with long-term current use of insulin (CMS-HCC)   ??? Depression   ??? Psoriatic arthritis (CMS-HCC)   ??? Perforation of right tympanic membrane   ??? Bilateral hearing loss   ??? Hypertension associated with diabetes (CMS-HCC)   ??? Essential hypertension       Reviewed and up to date in Epic.    Appropriateness of Therapy     Is medication and dose appropriate based on diagnosis? Yes    Prescription has been clinically reviewed: Yes    Baseline Quality of Life Assessment      Rheumatology:   Quality of Life    On a scale of 1 ??? 10 with 1 representing not at all and 10 representing completely ??? how has your rheumatologic condition affected your:         Financial Information  Medication Assistance provided: Prior Authorization and Copay Assistance    Anticipated copay of $0.00 reviewed with patient. Verified delivery address.    Delivery Information     Scheduled delivery date: 06/25/2019    Expected start date: n/a; pt is currently taking - next dose is due sometime at end of next week    Medication will be delivered via UPS to the prescription address in [facility].  This shipment will not require a signature.      Explained the services we provide at [facility] and that each month we would call to set up refills.  Stressed importance of returning phone calls so that we could ensure they receive their medications in time each month.  Informed patient that we should be setting up refills 7-10 days prior to when they will run out of medication.  A pharmacist will reach out to perform a clinical assessment periodically.  Informed patient that a welcome packet and a drug information handout will be sent.      Patient verbalized understanding of the above information as well as how to contact the pharmacy at [phone] option 4 with any questions/concerns.  The pharmacy is open Monday through Friday 8:30am-4:30pm.  A pharmacist is available 24/7 via pager to answer any clinical questions they may have.    Patient Specific Needs     ? Does the patient have any physical, cognitive, or cultural barriers? No    ? Patient prefers to have medications discussed with  Patient     ? Is the patient or caregiver able to read and understand education materials at a high school level or above? Yes    ? Patient's primary language is  English     ? Is the patient high risk? No     ? Does the patient require a Care Management Plan? No     ? Does the patient require physician intervention or other additional services (i.e. nutrition, smoking cessation, social work)? No      Karene Fry Melvine Julin  [facility] Specialty Pharmacist

## 2019-06-24 MED FILL — COSENTYX PEN 300 MG/2 PENS (150 MG/ML) SUBCUTANEOUS: 28 days supply | Qty: 2 | Fill #0 | Status: AC

## 2019-07-15 DIAGNOSIS — E1129 Type 2 diabetes mellitus with other diabetic kidney complication: Principal | ICD-10-CM

## 2019-07-15 DIAGNOSIS — R809 Proteinuria, unspecified: Principal | ICD-10-CM

## 2019-07-15 DIAGNOSIS — Z794 Long term (current) use of insulin: Principal | ICD-10-CM

## 2019-07-15 MED ORDER — GLIPIZIDE 5 MG TABLET
ORAL_TABLET | 11 refills | 0 days | Status: CP
Start: 2019-07-15 — End: ?

## 2019-07-15 NOTE — Unmapped (Signed)
Meds refill accepted per protocol.

## 2019-07-16 NOTE — Unmapped (Signed)
[facility] Clinical Assessment & Refill Coordination Note    Julia Avila, DOB: 1965-04-27  Phone: [phone] (home)     All above HIPAA information was verified with patient.     Was a Nurse, learning disability used for this call? No    Specialty Medication(s):   Inflammatory Disorders: Cosentyx     Current Outpatient Medications   Medication Sig Dispense Refill   ??? blood sugar diagnostic (GLUCOSE BLOOD) Strp Test blood sugars three times daily. ICD-10 E11.9 100 strip 11   ??? blood-glucose meter kit Use as instructed 1 each 0   ??? blood-glucose meter Misc Use to check blood sugars three times daily. ICD10 E11.9 1 each 0   ??? buPROPion (WELLBUTRIN XL) 150 MG 24 hr tablet Take 1 tablet (150 mg total) by mouth every morning. restarted 90 tablet 0   ??? clobetasoL (TEMOVATE) 0.05 % external solution Apply topically Two (2) times a day. 50 mL 0   ??? FLUoxetine (PROZAC) 40 MG capsule TAKE 2 CAPSULES(80 MG) BY MOUTH DAILY 180 capsule 3   ??? FREESTYLE LITE STRIPS Strp by Other route once daily. Test daily before breakfast. Dispense 90 day supply. 100 strip 3   ??? glipiZIDE (GLUCOTROL) 5 MG tablet Take 1 tablet(5 mg tab) by mouth daily 60 tablet 11   ??? ibuprofen (ADVIL,MOTRIN) 200 MG tablet Take 200 mg by mouth every six (6) hours as needed for pain.     ??? inhalational spacing device Spcr Use with inhaler to maximize medication delivery to lungs 1 each 0   ??? insulin glargine (BASAGLAR) injection pen Inject 12 Units under the skin nightly. or as directed 45 mL 3   ??? insulin syringe-needle U-100 0.5 mL 31 gauge x 5/16 Syrg Injection Frequency is 1 time per day; Dx Code: Type 2 Diabetes uncontrolled (E11.65) 100 each 11   ??? lancets Misc 1 each by Other route once daily. Test daily before breakfast. Dispense 90 day supply. 100 each 3   ??? levothyroxine (SYNTHROID, LEVOTHROID) 75 MCG tablet Take 1 tablet (75 mcg total) by mouth daily. 90 tablet 2   ??? lisinopril (PRINIVIL,ZESTRIL) 20 MG tablet Take 1 tablet (20 mg total) by mouth daily. 90 tablet 3   ??? mometasone (ELOCON) 0.1 % lotion Apply with cotton ball to ear canals t bilaterally once every 2weeks 60 mL 0   ??? pen needle, diabetic (BD ULTRA-FINE NANO PEN NEEDLES) 32 gauge x 5/32 Ndle Needles for insulin pen. Inject daily as instructed. ICD-10 E11.9 30 each 0   ??? pravastatin (PRAVACHOL) 40 MG tablet Take 1 tablet (40 mg total) by mouth daily. 90 tablet 3   ??? secukinumab (COSENTYX) 150 mg/mL PnIj injection Inject the contents of 2 pens (300 mg total) under the skin every twenty-eight (28) days. 6 mL 3   ??? triamcinolone (KENALOG) 0.1 % ointment Apply topically Two (2) times a day. 80 g 2     No current facility-administered medications for this visit.         Changes to medications: Majorie reports no changes at this time.    Allergies   Allergen Reactions   ??? Nitrofurantoin Macrocrystal Rash     Large red welts all over her body       Changes to allergies: No    SPECIALTY MEDICATION ADHERENCE     Cosentyx 150mg /ml: 0 days of medicine on hand     Medication Adherence    Patient reported X missed doses in the last  month: 0  Specialty Medication: Cosentyx 150mg /ml          Specialty medication(s) dose(s) confirmed: Regimen is correct and unchanged.     Are there any concerns with adherence? No    Adherence counseling provided? Not needed    CLINICAL MANAGEMENT AND INTERVENTION      Clinical Benefit Assessment:    Do you feel the medicine is effective or helping your condition? Yes    Clinical Benefit counseling provided? Not needed    Adverse Effects Assessment:    Are you experiencing any side effects? No    Are you experiencing difficulty administering your medicine? No    Quality of Life Assessment:    Rheumatology:   Quality of Life    On a scale of 1 ??? 10 with 1 representing not at all and 10 representing completely ??? how has your rheumatologic condition affected your:  Daily pain level?: decline to answer  Ability to complete your regular daily tasks (prepare meals, get dressed, etc.)?: decline to answer  Ability to participate in social or family activities?: decline to answer         Have you discussed this with your provider? Not needed    Therapy Appropriateness:    Is therapy appropriate? Yes, therapy is appropriate and should be continued    DISEASE/MEDICATION-SPECIFIC INFORMATION      For patients on injectable medications: Patient currently has 0 doses left.  Next injection is scheduled for sometime next week.    PATIENT SPECIFIC NEEDS     ? Does the patient have any physical, cognitive, or cultural barriers? No    ? Is the patient high risk? No     ? Does the patient require a Care Management Plan? No     ? Does the patient require physician intervention or other additional services (i.e. nutrition, smoking cessation, social work)? No      SHIPPING     Specialty Medication(s) to be Shipped:   Inflammatory Disorders: Cosentyx 150mg /ml    Other medication(s) to be shipped: none       Changes to insurance: No    Delivery Scheduled: Yes, Expected medication delivery date: 07/19/2019.     Medication will be delivered via UPS to the confirmed prescription address in [facility].    The patient will receive a drug information handout for each medication shipped and additional FDA Medication Guides as required.  Verified that patient has previously received a Conservation officer, historic buildings.    All of the patient's questions and concerns have been addressed.    Karene Fry Benisha Hadaway   [facility] Specialty Pharmacist

## 2019-07-18 MED FILL — COSENTYX PEN 300 MG/2 PENS (150 MG/ML) SUBCUTANEOUS: SUBCUTANEOUS | 28 days supply | Qty: 2 | Fill #1

## 2019-07-18 MED FILL — COSENTYX PEN 300 MG/2 PENS (150 MG/ML) SUBCUTANEOUS: 28 days supply | Qty: 2 | Fill #1 | Status: AC

## 2019-07-31 ENCOUNTER — Encounter
Admit: 2019-07-31 | Discharge: 2019-08-01 | Payer: PRIVATE HEALTH INSURANCE | Attending: Pharmacist Clinician (PhC)/ Clinical Pharmacy Specialist | Primary: Pharmacist Clinician (PhC)/ Clinical Pharmacy Specialist

## 2019-07-31 DIAGNOSIS — E1129 Type 2 diabetes mellitus with other diabetic kidney complication: Principal | ICD-10-CM

## 2019-07-31 DIAGNOSIS — R809 Proteinuria, unspecified: Principal | ICD-10-CM

## 2019-07-31 DIAGNOSIS — Z794 Long term (current) use of insulin: Secondary | ICD-10-CM

## 2019-07-31 NOTE — Unmapped (Signed)
Diabetes Enhanced Care Visit  Assessment/Plan:      1. Type 2 diabetes mellitus with microalbuminuria, with long-term current use of insulin (CMS-HCC)    2. Hypertension associated with diabetes (CMS-HCC)        1. Diabetes, type 2  ?? SMBG not available (Not checking)  Lab Results   Component Value Date    A1C 8.6 (H) 05/31/2019   ?? ; HgbA1c above goal; unfortunately POC A1c not working in clinic today.  ?? Reviewed symptoms and treatment of hypoglycemia, including need to check BG prior to and following treatment.  ?? Pharmacotherapy plan:   Continue basaglar 42 units nightly. Work on adherence. Advised she may keep basaglar pen in her purse at room temp so she can take it while working.  Continue glipizide 5mg  PO BID  Discussed GLP1 RA vs SGLT2i, but will await SMBG or A1c to add another med  ?? Exercise recommendations: discussed briefly today, but no goal set  ?? SMBG instructions:  once daily fasting    2. DM Related Health Care Maintenance    Hypertension  ?? BP near goal today. Goal <130/80  ?? Currently taking lisinopril 20mg  PO daily, SCr 0.48 02/20/19, K 4.5 01/18/19  ?? Plan: consider changing to lisinopril-hydrochlorothiazide 20-12.5mg  vs adding SGLT2i for DM if BP above goal at next visit. Due for SCr, K recheck with next set of labs.    ASCVD Risk Assessment  The 10-year ASCVD risk score Denman George DC Jr., et al., 2013) is: 7.8%    Values used to calculate the score:      Age: 12 years      Sex: Female      Is Non-Hispanic African American: No      Diabetic: Yes      Tobacco smoker: No      Systolic Blood Pressure: 131 mmHg      Is BP treated: Yes      HDL Cholesterol: 36 mg/dL      Total Cholesterol: 235 mg/dL    Note: For patients with SBP <90 or >200, Total Cholesterol <130 or >320, HDL <20 or >100 which are outside of the allowable range, the calculator will use these upper or lower values to calculate the patient???s risk score.  ??     ?? Taking statin - moderate intensity.   ?? Plan: continue pravastatin 40mg  daily     ?? Not taking daily ASA  ?? Daily ASA - not indicated.     Tobacco Use  ?? Non-smoker.  ?? Plan: n/a    Diabetic Nephropathy Screening  ?? Urine Albumin/Creatinine: 133, indicating elevated urinary albumin excretion.    ?? Taking ACEI/ARB.    Diabetic Retinopathy Screening  ?? .Eye Exam Due on 04/23/20    Diabetic Neuropathy Screening  ?? Foot Exam Due 04/23/20     Immunizations  Vaccine status:   Immunization History   Administered Date(s) Administered   ??? COVID-19 VACC,MRNA,(PFIZER)(PF)(IM) 06/21/2019, 07/12/2019   ??? Influenza Vaccine Quad (IIV4 PF) 79mo+ injectable 01/12/2015, 01/13/2016, 01/09/2017   ??? Influenza Virus Vaccine, unspecified formulation 01/30/2018, 02/20/2019   ??? PNEUMOCOCCAL POLYSACCHARIDE 23 09/09/2014   ??? Pneumococcal Conjugate 13-Valent 05/31/2019     ?? Due for: tdap and shingrix; wait 4 weeks post COVID immunization    #Falls  ?? Will need follow-up with PCP to address. Will work on DM adherence and encourage exercise (suspect deconditioning). Consider PT referral at next visit        Follow-up: Return in about  2 weeks (around 08/14/2019) for phone with [name].    Future Appointments   Date Time Provider Department Center   08/14/2019  3:30 PM Jeanella Flattery, CPP [facility]   10/03/2019  8:40 AM Rumey Sherie Don, MD [facility]       Medication adherence and barriers to the treatment plan have been addressed. Opportunities to optimize healthy behaviors have been discussed. Patient / caregiver voiced understanding.      _________________________________________________    Subjective     Reason for visit:    Julia Avila is a 54 y.o. year old female with a history of diabetes (type 2), who presents today for a diabetes enhanced care visit.  Patient presents to this visit alone    Known DM Complications: albuminuria    Date of Last Diabetes Related Visit: 05/19/19 with Arby Barrette    Action At Last Diabetes Related Visit:    Continue adherence w/ Basaglar 42u nightly and glipizide 5mg  BID.  At some, would like to d/c glipizide while on concomitant insulin.  Reassess a1c next visit as current level would likely reflect high readings from 11/20 admission.  Continue statin, ACEi.      Since Last visit / History of Present Ilness:    Patient reports partially implementing plan from last visit. She reports frequently missing Basaglar doses. She was not aware that the insulin pen she is using can be stored at room temp. She takes all of her other meds with her when working evening/overnight shifts, but misses her insulin on those days.    Reported DM Regimen:    Glipizide 5mg  BID  Basaglar 42 units nightly      Medication Adherence and Access:  Since last visit, patient reports missing doses of her insulin often as noted  Above.    Last fill of Basaglar on 04/30/19 for 33 day supply    Means of obtaining testing supplies:    ?? Obtained from local pharmacy  ?? Billing - private insurance  ?? Reports that supplies are affordable.    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    Other History:  Reports several mechanical falls in the past few months.  _________________________________________________    Past Medical History:    Active Ambulatory Problems     Diagnosis Date Noted   ??? Anemia 08/08/2014   ??? Type 2 diabetes mellitus with microalbuminuria, with long-term current use of insulin (CMS-HCC) 09/09/2014   ??? Depression 09/09/2014   ??? Psoriatic arthritis (CMS-HCC) 11/05/2014   ??? Perforation of right tympanic membrane 05/01/2015   ??? Bilateral hearing loss 05/01/2015   ??? Hypertension associated with diabetes (CMS-HCC) 01/13/2016   ??? Essential hypertension 01/09/2017     Resolved Ambulatory Problems     Diagnosis Date Noted   ??? Right lower quadrant abdominal pain 08/08/2014   ??? Nausea 08/08/2014   ??? Fever 08/08/2014 ??? Leukocytosis 08/08/2014   ??? Abnormal abdominal CT scan 08/08/2014   ??? Ileitis 08/08/2014   ??? Dehydration 08/08/2014   ??? RSV (respiratory syncytial virus infection) 05/01/2015   ??? Otitis externa of left ear 09/21/2015     Past Medical History:   Diagnosis Date   ??? Diabetes mellitus (CMS-HCC)    ??? Hypertension    ???  Hypothyroidism    ??? Nephrolithiasis    ??? Psoriasis    ??? Vitamin D deficiency        Social History:  Social History     Tobacco Use   Smoking Status Never Smoker   Smokeless Tobacco Never Used       Medications:  Medications were reviewed and updated in Epic today.         Objective     ROS     Physical Examination:  Vitals:    Vitals:    07/31/19 1553   BP: 131/82   Pulse: 73   Weight: 82.6 kg (182 lb)   Height: 160 cm (5' 3)       Wt Readings from Last 3 Encounters:   07/31/19 82.6 kg (182 lb)   05/31/19 83.3 kg (183 lb 11.2 oz)   05/01/19 80.7 kg (178 lb)       Body mass index is 32.24 kg/m??.    Physical Exam  Constitutional:       Appearance: She is obese.   Pulmonary:      Effort: Pulmonary effort is normal.   Neurological:      Mental Status: She is alert.

## 2019-07-31 NOTE — Unmapped (Addendum)
Diabetes Medicine Instructions:  ?? Basaglar 42 units every day  ?? Glipizide 5mg  every morning and 5mg  every evening    Check Blood Sugar:  ?? Check fasting blood sugar every morning before eating or drinking anything    Low Blood Sugars:  For blood sugar less than 70 --> Treat with 4 ounces of juice or regular soda, or with 3 to 4 glucose tablets. Re-check blood sugar in 15 minutes.    --If blood sugar is still less than 70 on re-check, treat again and re-check in 15 minutes.   --If blood sugar is over 70 on re-check and it is time to eat your regular meal, eat regular meal and take insulin as prescribed for that blood sugar.    [employer] makes 4 ounce juice boxes that can be great for treatment of lows.

## 2019-08-12 MED ORDER — BASAGLAR KWIKPEN U-100 INSULIN 100 UNIT/ML (3 ML) SUBCUTANEOUS
0 days
Start: 2019-08-12 — End: ?

## 2019-08-16 DIAGNOSIS — L4050 Arthropathic psoriasis, unspecified: Principal | ICD-10-CM

## 2019-08-16 MED ORDER — CLOBETASOL 0.05 % SCALP SOLUTION
Freq: Two times a day (BID) | TOPICAL | 0 refills | 0 days | Status: CP
Start: 2019-08-16 — End: ?

## 2019-08-20 NOTE — Unmapped (Signed)
The [facility] has made a third and final attempt to reach this patient to refill the following medication:Cosentyx.      We have left voicemails on the following phone numbers: [phone].    Dates contacted: 4/23, 4/28, 5/4  Last scheduled delivery: 4/1    The patient may be at risk of non-compliance with this medication. The patient should call the [facility] at [phone] (option 4) to refill medication.    Olga Millers   [facility] Specialty Technician

## 2019-08-26 MED ORDER — MOMETASONE 0.1 % TOPICAL SOLUTION
0 refills | 0 days
Start: 2019-08-26 — End: ?

## 2019-08-29 NOTE — Unmapped (Signed)
[facility] Refill Coordination Note    Specialty Medication(s) to be Shipped:   Inflammatory Disorders: Cosentyx    Other medication(s) to be shipped: N/A     Julia Avila, DOB: 06-11-1965  Phone: [phone] (home)       All above HIPAA information was verified with patient.     Was a Nurse, learning disability used for this call? No    Completed refill call assessment today to schedule patient's medication shipment from the [facility] [phone]).       Specialty medication(s) and dose(s) confirmed: Regimen is correct and unchanged.   Changes to medications: Julia Avila reports no changes at this time.  Changes to insurance: No  Questions for the pharmacist: No    Confirmed patient received Welcome Packet with first shipment. The patient will receive a drug information handout for each medication shipped and additional FDA Medication Guides as required.       DISEASE/MEDICATION-SPECIFIC INFORMATION        For patients on injectable medications: Patient currently has 0 doses left.  Next injection is scheduled for ASAP.    SPECIALTY MEDICATION ADHERENCE     Medication Adherence    Patient reported X missed doses in the last month: 0  Specialty Medication: COSENTYX PEN (2 PENS) 150 mg/mL Pnij injection (secukinumab)  Patient is on additional specialty medications: No            SHIPPING     Shipping address confirmed in Epic.     Delivery Scheduled: Yes, Expected medication delivery date: 09/03/2019.     Medication will be delivered via UPS to the prescription address in Epic WAM.    Julia Avila [facility] Specialty Technician

## 2019-09-02 MED FILL — COSENTYX PEN 300 MG/2 PENS (150 MG/ML) SUBCUTANEOUS: SUBCUTANEOUS | 28 days supply | Qty: 2 | Fill #2

## 2019-09-02 MED FILL — COSENTYX PEN 300 MG/2 PENS (150 MG/ML) SUBCUTANEOUS: 28 days supply | Qty: 2 | Fill #2 | Status: AC

## 2019-09-05 ENCOUNTER — Ambulatory Visit: Admit: 2019-09-05 | Payer: PRIVATE HEALTH INSURANCE | Attending: Registered" | Primary: Registered"

## 2019-09-20 NOTE — Unmapped (Signed)
[facility] Refill Coordination Note    Specialty Medication(s) to be Shipped:   Inflammatory Disorders: Cosentyx    Other medication(s) to be shipped: N/A     KIMBERLIN SCHEEL, DOB: 1965/10/31  Phone: [phone] (home)       All above HIPAA information was verified with patient.     Was a Nurse, learning disability used for this call? No    Completed refill call assessment today to schedule patient's medication shipment from the [facility] [phone]).       Specialty medication(s) and dose(s) confirmed: Regimen is correct and unchanged.   Changes to medications: Charita reports no changes at this time.  Changes to insurance: No  Questions for the pharmacist: No    Confirmed patient received Welcome Packet with first shipment. The patient will receive a drug information handout for each medication shipped and additional FDA Medication Guides as required.       DISEASE/MEDICATION-SPECIFIC INFORMATION        For patients on injectable medications: Patient currently has 0 doses left.  Next injection is scheduled for 09/27/19.    SPECIALTY MEDICATION ADHERENCE     Medication Adherence    Patient reported X missed doses in the last month: 0  Specialty Medication: Cosentyx 150mg /ml  Patient is on additional specialty medications: No                  Cosentyx 150 mg/ml: 0 days of medicine on hand       SHIPPING     Shipping address confirmed in Epic.     Delivery Scheduled: Yes, Expected medication delivery date: 09/25/19.     Medication will be delivered via UPS to the prescription address in Epic WAM.    Nancy Nordmann [facility] Specialty Technician

## 2019-09-24 MED FILL — COSENTYX PEN 300 MG/2 PENS (150 MG/ML) SUBCUTANEOUS: SUBCUTANEOUS | 28 days supply | Qty: 2 | Fill #3

## 2019-09-24 MED FILL — COSENTYX PEN 300 MG/2 PENS (150 MG/ML) SUBCUTANEOUS: 28 days supply | Qty: 2 | Fill #3 | Status: AC

## 2019-10-01 ENCOUNTER — Encounter: Admit: 2019-10-01 | Discharge: 2019-10-02 | Payer: PRIVATE HEALTH INSURANCE

## 2019-10-01 DIAGNOSIS — L281 Prurigo nodularis: Principal | ICD-10-CM

## 2019-10-01 DIAGNOSIS — L299 Pruritus, unspecified: Principal | ICD-10-CM

## 2019-10-01 MED ORDER — GABAPENTIN 300 MG CAPSULE
ORAL_CAPSULE | 3 refills | 0 days | Status: CP
Start: 2019-10-01 — End: ?

## 2019-10-01 MED ORDER — MUPIROCIN 2 % TOPICAL OINTMENT
4 refills | 0 days | Status: CP
Start: 2019-10-01 — End: ?

## 2019-10-01 NOTE — Unmapped (Addendum)
Basic Skin Care  Your skin plays an important role in keeping the entire body healthy.  Below are some tips on how to try and maximize skin health from the outside in.    1) Bathe in mildly warm water every 1 to 2 days, followed by light drying and an application of a thick moisturizer cream or ointment, preferably one that comes in a tub.  a. Fragrance free moisturizing bars or body washes are preferred such as Purpose, Cetaphil, Dove sensitive skin, Aveeno, or Vanicream products.  b. Use a fragrance free cream or ointment, not a lotion, such as plain petroleum jelly or Vaseline ointment, Aquaphor, Vanicream, Eucerin cream or a generic version, CeraVe Cream, Cetaphil Restoraderm, Aveeno Eczema Therapy and [employer], among others.  c. People with very dry skin often need to put on these creams two, three or four times a day.  As much as possible, use these creams enough to keep the skin from looking dry.  d. Consider using fragrance free/dye free detergent, such as Arm and Hammer for sensitive skin, Tide Free or All Free.     2) If I am prescribing a medication to go on the skin, the medicine goes on first to the areas that need it, followed by a thick cream as above to the entire body.    3) Wynelle Link is a major cause of damage to the skin.  a. I recommend sun protection for all of my patients. I prefer physical barriers such as hats with wide brims that cover the ears, long sleeve clothing with SPF protection including rash guards for swimming. These can be found seasonally at outdoor clothing companies, [employer] and [employer] and online at Liz [email or web address].com, www.uvskinz.com and BrideEmporium.nl. Avoid peak sun between the hours of 10am to 3pm to minimize sun exposure.    b. I recommend sunscreen for all of my patients older than 36 months of age when in the sun, preferably with broad spectrum coverage and SPF 30 or higher.   i. For sensitive skin: I recommend sunscreens that only contain titanium dioxide and/or zinc oxide in the active ingredients. These do not burn the eyes and appear to be safer than chemical sunscreens. Products includ Vanicream Broad Spectrum 50+, Aveeno Natural Mineral Protection, Neutrogena Pure and Free Baby, [employer] Daily face and body lotion, and EltaMD.  ii. There is no such thing as waterproof sunscreen. All sunscreens should be reapplied after 60-80 minutes of wear.   Iii. For those that prefer not to use cream-based sunscreen, there are alternatives such as SPRAYS and STICKS.  These are also good options if you are sweaty or working out.  Spray on sunscreens often use chemical sunscreens which do protect against the sun. However, these can be difficult to apply correctly, especially if wind is present, and can be more likely to irritate the skin.  I recommend applying it at least twice over the involved areas.  For the stick, I recommend Neutrogena.  It looks like a deodorant stick.  It must be applied 4 times over the same area to protect as much as the cream.      c. I also recommend discussing Vitamin D supplementation with your primary care doctor as patients typically do not get enough Vitamin D through the skin in our area, even without using sunscreen.    Cryosurgery  Cryosurgery (???freezing???) uses liquid nitrogen to destroy certain types of skin lesions. Lowering the temperature of the lesion in a small area  surrounding skin destroys the lesion. Immediately following cryosurgery, you will notice redness and swelling of the treatment area. Blistering or weeping may occur, lasting approximately one week which will then be followed by crusting. Most areas will heal completely in 10 to 14 days.    Wash the treated areas daily. Allow soap and water to run over the areas, but do not scrub. Should a scab or crust form, allow it to fall off on its own. Do not remove or pick at it. Application of an ointment  and a bandage may make you feel more comfortable, but it is not necessary. Some people develop an allergy to Neosporin, so we recommend that Vaseline or  Aquaphor be used.    The cryotherapy site will be more sensitive than your surrounding skin. Keep it covered, and remember to apply sunscreen every day to all your sun exposed skin. A scar may remain which is lighter or pinker than your normal skin. Your body will continue to improve your scar for up to one year; however a light-colored scar may remain.    Infection following cryotherapy is rare. However if you are worried about the appearance of the treated area, contact your doctor. We have a physician on call at all times. If you have any concerns about the site, please call our clinic at [phone]      Patient Education        gabapentin  Pronunciation:  GA ba PEN tin  Brand:  Gralise, Horizant, Neurontin  What is the most important information I should know about gabapentin?  Gabapentin can cause life-threatening breathing problems, especially if you already have a breathing disorder or if you use other medicines that can make you drowsy or slow your breathing. Seek emergency medical attention if you have very slow breathing.  Some people have thoughts about suicide or behavior changes while taking gabapentin. Stay alert to changes in your mood or symptoms. Report any new or worsening symptoms to your doctor.  Do not stop using gabapentin suddenly, even if you feel fine.  What is gabapentin?  Gabapentin is used together with other medicines to treat partial seizures in adults and children at least 68 years old.  Gabapentin is also used to treat nerve pain caused by herpes virus or shingles (herpes zoster) in adults.  Use only the brand and form of gabapentin your doctor has prescribed.  Check your medicine each time you get a refill to make sure you receive the correct form.  Gralise is used only to treat nerve pain.  Horizant is used to treat nerve pain and restless legs syndrome (RLS). Neurontin is used to treat nerve pain and seizures.  Gabapentin may also be used for purposes not listed in this medication guide.  What should I discuss with my healthcare provider before taking gabapentin?  You should not use gabapentin if you are allergic to it.  Tell your doctor if you have ever had:  ?? breathing problems or lung disease, such as chronic obstructive pulmonary disease (COPD);  ?? kidney disease (or if you are on dialysis);  ?? diabetes;  ?? depression, a mood disorder, or suicidal thoughts or actions;  ?? a drug addiction;  ?? a seizure (unless you take gabapentin to treat seizures);  ?? liver disease;  ?? heart disease; or  ?? (for patients with RLS) if you are a day sleeper or work a night shift.  Some people have thoughts about suicide while taking this medicine. Children taking gabapentin  may have behavior changes. Stay alert to changes in your mood or symptoms. Report any new or worsening symptoms to your doctor.  It is not known whether this medicine will harm an unborn baby. Tell your doctor if you are pregnant or plan to become pregnant.  Seizure control is very important during pregnancy, and having a seizure could harm both mother and baby. Do not start or stop taking gabapentin for seizures without your doctor's advice, and tell your doctor right away if you become pregnant.  If you are pregnant, your name may be listed on a pregnancy registry to track the effects of gabapentin on the baby.  It may not be safe to breastfeed while using this medicine.  Ask your doctor about any risk.  How should I take gabapentin?  Follow all directions on your prescription label. Do not take this medicine in larger or smaller amounts or for longer than recommended.  If your doctor changes your brand, strength, or type of gabapentin, your dosage needs may change.  Ask your pharmacist if you have any questions about the new kind of gabapentin you receive at the pharmacy.  Both Gralise and Horizant should be taken with food.  Neurontin can be taken with or without food.  If you break a Neurontin tablet and take only half of it, take the other half at your next dose. Any tablet that has been broken should be used as soon as possible or within a few days.  Swallow the capsule  or tablet  whole and do not crush, chew, break, or open it.  Measure liquid medicine carefully. Use the dosing syringe provided, or use a medicine dose-measuring device (not a kitchen spoon).  Do not stop using gabapentin suddenly, even if you feel fine. Stopping suddenly may cause increased seizures. Follow your doctor's instructions about tapering your dose.  In case of emergency, wear or carry medical identification to let others know you have seizures.  This medicine can cause unusual results with certain medical tests. Tell any doctor who treats you that you are using gabapentin.  Store gabapentin tablets and capsules at room temperature away from light and moisture.  Store the liquid medicine in the refrigerator. Do not freeze.  What happens if I miss a dose?  Take the medicine as soon as you can, but skip the missed dose if it is almost time for your next dose. Do not take two doses at one time.  If you take Horizant:  Skip the missed dose and use your next dose at the regular time. Do not use two doses of Horizant at one time.  What happens if I overdose?  Seek emergency medical attention or call the Poison Help line at [phone].  What should I avoid while taking gabapentin?  Avoid driving or hazardous activity until you know how this medicine will affect you. Your reactions could be impaired. Dizziness or drowsiness can cause falls, accidents, or severe injuries.  Avoid taking an antacid within 2 hours before you take gabapentin. Antacids can make it harder for your body to absorb gabapentin.  Avoid drinking alcohol while taking gabapentin.  What are the possible side effects of gabapentin?  Get emergency medical help if you have signs of an allergic reaction: hives; difficult breathing; swelling of your face, lips, tongue, or throat.  Seek medical treatment if you have a serious drug reaction that can affect many parts of your body. Symptoms may include: skin rash, fever, swollen glands, muscle aches, severe  weakness, unusual bruising, upper stomach pain, or yellowing of your skin or eyes.  Report any new or worsening symptoms to your doctor, such as: mood or behavior changes, anxiety, panic attacks, trouble sleeping, or if you feel impulsive, irritable, agitated, hostile, aggressive, restless, hyperactive (mentally or physically), depressed, or have thoughts about suicide or hurting yourself.  Call your doctor at once if you have:  ?? weak or shallow breathing;  ?? blue-colored skin, lips, fingers, and toes;  ?? confusion, extreme drowsiness or weakness;  ?? problems with balance or muscle movement;  ?? unusual or involuntary eye movements; or  ?? increased seizures.  Gabapentin can cause life-threatening breathing problems. A person caring for you should seek emergency medical attention if you have slow breathing with long pauses, blue colored lips, or if you are hard to wake up. Breathing problems may be more likely in older adults or in people with COPD.  Some side effects are more likely in children taking gabapentin. Contact your doctor if the child taking this medicine has any of the following side effects:  ?? changes in behavior;  ?? memory problems;  ?? trouble concentrating; or  ?? acting restless, hostile, or aggressive.  Common side effects may include:  ?? fever, chills, sore throat, body aches, unusual tiredness;  ?? jerky movements;  ?? headache;  ?? double vision;  ?? swelling of your legs and feet;  ?? tremors;  ?? trouble speaking;  ?? dizziness, drowsiness, tiredness;  ?? problems with balance or eye movements; or  ?? nausea, vomiting.  This is not a complete list of side effects and others may occur. Call your doctor for medical advice about side effects. You may report side effects to FDA at 1-800-FDA-1088.  What other drugs will affect gabapentin?  Using gabapentin with other drugs that make you drowsy or slow your breathing can cause dangerous side effects or death. Ask your doctor before using opioid medication, a sleeping pill, cold or allergy medicine, a muscle relaxer, or medicine for anxiety or seizures.  Other drugs may affect gabapentin, including prescription and over-the-counter medicines, vitamins, and herbal products. Tell your doctor about all your current medicines and any medicine you start or stop using.  Where can I get more information?  Your pharmacist can provide more information about gabapentin.  Remember, keep this and all other medicines out of the reach of children, never share your medicines with others, and use this medication only for the indication prescribed.   Every effort has been made to ensure that the information provided by [employer] ('[employer]') is accurate, up-to-date, and complete, but no guarantee is made to that effect. Drug information contained herein may be time sensitive. [employer] information has been compiled for use by healthcare practitioners and consumers in the [place] and therefore [employer] does not warrant that uses outside of the [place] are appropriate, unless specifically indicated otherwise. [employer]'s drug information does not endorse drugs, diagnose patients or recommend therapy. [employer]'s drug information is an Investment banker, corporate to assist licensed healthcare practitioners in caring for their patients and/or to serve consumers viewing this service as a supplement to, and not a substitute for, the expertise, skill, knowledge and judgment of healthcare practitioners. The absence of a warning for a given drug or drug combination in no way should be construed to indicate that the drug or drug combination is safe, effective or appropriate for any given patient. Mult[employer] does not assume any responsibility for any aspect of healthcare administered  with the aid of information [employer] provides. The information contained herein is not intended to cover all possible uses, directions, precautions, warnings, drug interactions, allergic reactions, or adverse effects. If you have questions about the drugs you are taking, check with your doctor, nurse or pharmacist.  Copyright [phone] [employer] Version: 17.01. Revision date: 05/14/2019.  Care instructions adapted under license by [facility]. If you have questions about a medical condition or this instruction, always ask your healthcare professional. [employer] disclaims any warranty or liability for your use of this information.

## 2019-10-01 NOTE — Unmapped (Signed)
DERMATOLOGY CLINIC NOTE    ASSESSMENT AND PLAN:     Pruritus in the context of elevated HbA1C:  -Discussed that it can be multifactorial but is particularly common in the context of diabetes.  -Start gabapentin (NEURONTIN) 300 MG capsule; Take 1 tablet by mouth daily for 1 week, then increase to 1 tablet twice daily.  Dispense: 60 capsule; Refill: 3  -Discussed medication risks and benefits especially fatigue when she first starts the medication.  -We will plan to see back in about 2 months and titrate up as needed.    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  There is no obvious evidence of a primary lesion (no obvious psoriasis or actinic keratosis) although I discussed that sometimes these primary lesions can turn into a prurigo nodule secondary to chronic rubbing.  Encouraged trying to minimize rubbing is much as possible  - Start mupirocin (BACTROBAN) 2 % ointment; Apply topically to open areas on arms twice daily as needed.  Dispense: 30 g; Refill: 4    History of psoriasis and psoriatic arthritis:  -Currently on Cosentyx.  She says that she did not need any refills for this from [place] today.    History of allergic drug reaction to nitrofurantoin:  -Currently resolved and not discussed today.    Return to clinic:  2 months or sooner.     CHIEF COMPLAINT:  Rash and papules on forearms     HPI:   This is a pleasant 54 y.o. female who last saw me on 04/30/2019 at that time for psoriasis involving be ear canals and posterior neck, less than 5% body surface area involvement with psoriatic arthritis.  She was on Cosentyx at that time.  We also recommended triamcinolone ointment for her and fluocinolone oil twice daily for her ears.  Prior treatments for her psoriasis have included methotrexate, Humira, Otezla, Simponi.    She has diabetes which she is struggling to control.  Her most recent HbA1c was 8.6.  She also complains of significant itch which she believes may be exacerbating her arm lesions.     PAST MEDICAL HISTORY:  No history of skin cancer  History of psoriatic arthritis.  ??  Past Medical History, Family History, Social History , Med List, Allergies, Problem List reviewed in the rooming section of Epic   History of psoriatic arthritis, previously on Enbrel and Humira as well as [place] and Simponi  Currently on Cosentyx  Type 2 diabetes  Hypothyroidism  Hypertension    MEDICATIONS:   Reviewed in epic.    ALLERGIES:   Nitrofurantoin    SOCIAL HISTORY:  Works as a Actor    FAMILY HISTORY:  Negative for melanoma     REVIEW OF SYSTEMS:  Baseline state of health. No recent illnesses. No other skin complaints.     PHYSICAL EXAMINATION:  Examination in the presence of female chaperone:  General: Well-developed, well-nourished. No acute distress.   Neuro: Alert and oriented, answers questions appropriately.  Skin: Examination of the scalp, face, head, neck, chest, back, upper extremities, hands, palms was performed and notable for the following:  Prurigoform nodules on bilateral forearms with areas of excoriation     Dictation software was used while making this note. Please excuse any errors made with dictation software.

## 2019-10-02 ENCOUNTER — Institutional Professional Consult (permissible substitution): Admit: 2019-10-02 | Discharge: 2019-10-03 | Payer: PRIVATE HEALTH INSURANCE

## 2019-10-02 ENCOUNTER — Institutional Professional Consult (permissible substitution)
Admit: 2019-10-02 | Discharge: 2019-10-03 | Payer: PRIVATE HEALTH INSURANCE | Attending: Pharmacist Clinician (PhC)/ Clinical Pharmacy Specialist | Primary: Pharmacist Clinician (PhC)/ Clinical Pharmacy Specialist

## 2019-10-02 ENCOUNTER — Ambulatory Visit
Admit: 2019-10-02 | Discharge: 2019-10-03 | Payer: PRIVATE HEALTH INSURANCE | Attending: Pharmacist Clinician (PhC)/ Clinical Pharmacy Specialist | Primary: Pharmacist Clinician (PhC)/ Clinical Pharmacy Specialist

## 2019-10-02 NOTE — Unmapped (Signed)
Internal Medicine Phone Visit    This visit is conducted via telephone.    Contact Information  Person Contacted: Patient  Contact Phone number: [phone] (home)   Is there someone else in the room? No.   Patient agreed to a phone visit    Julia Avila is a 54 y.o. female  participating in a telephone encounter.    Reason for Call  Follow up for diabetes    Subjective:  Julia Avila is a 54y/o F with PMHx T2DM, HTN, hypothyroidism, and psoriatic arthritis who presents for follow up of diabetes. She had a telephone visit with Forde Radon earlier this AM and discussed her medications in detail with her. She notes some difficulty taking her medications on a regular basis and states that she is working with [name] to develop a system that will help her take her medications regularly.     She also saw dermatology yesterday who had recommended starting gabapentin to target pruritis. She is wondering if it is safe to take this medication. She is also interested in getting allergy testing done as she continues to have intermitted pruritis. She denies chest pain, shortness of breath.     I have reviewed the problem list, medications, and allergies and have updated/reconciled them if needed.    Objective:  Able to speak in full sentences, no shortness of breath appreciated    Assessment & Plan:  1. Itching    2. Type 2 diabetes mellitus with microalbuminuria, with long-term current use of insulin (CMS-HCC)      1. Pruritis: Occurs intermittently for patient. Discussed that it is reasonable to try taking gabapentin (though patient does not describe other symptoms of neuropathy so wonder how effective this medication will be for her). Will send referral to allergy for environmental allergy testing.   - Allergy referral  - ok to trial taking gabapentin    2. T2DM: Working closely with Forde Radon to develop a medication schedule that will work for her. Notes she has missed doses of medications, especially recently. Will get labs drawn today, including a hemoglobin A1C.   - f/u A1C   - patient has visit in 2 weeks with Forde Radon   - continue taking basaglar 42 units at bedtime, glipizide 5mg  BID    Return in about 3 months (around 01/02/2020) for Recheck.     Staffed with Dr. Gerhard Munch, discussed        I spent 12 minutes on the phone with the patient on the date of service. I spent an additional 10 minutes on pre- and post-visit activities on the date of service.     The patient was physically located in [place] or a state in which I am permitted to provide care. The patient and/or parent/guardian understood that s/he may incur co-pays and cost sharing, and agreed to the telemedicine visit. The visit was reasonable and appropriate under the circumstances given the patient's presentation at the time.    The patient and/or parent/guardian has been advised of the potential risks and limitations of this mode of treatment (including, but not limited to, the absence of in-person examination) and has agreed to be treated using telemedicine. The patient's/patient's family's questions regarding telemedicine have been answered.     If the visit was completed in an ambulatory setting, the patient and/or parent/guardian has also been advised to contact their provider???s office for worsening conditions, and seek emergency medical treatment and/or call 911 if the patient deems either necessary.

## 2019-10-02 NOTE — Unmapped (Signed)
[facility] at [facility]       Type of visit:  telephone    Reason for visit: follow up    Questions / Concerns that need to be addressed: medication    General Consent to Treat (GCT) for non-epic video visits only: Verbal consent        Allergies reviewed: Yes    Medication reviewed: Yes  Pended refills? No        HCDM reviewed and updated in Epic:    We are working to make sure all of our patients??? wishes are updated in Epic and part of that is documenting a Environmental health practitioner for each patient  A Health Care Decision Rodena Piety is someone you choose who can make health care decisions for you if you are not able ??? who would you most want to do this for you????              COVID-19 Vaccine Summary  Type:  Complete  Dates Given:  07/12/2019                       Immunization History   Administered Date(s) Administered   ??? COVID-19 VACC,MRNA,(PFIZER)(PF)(IM) 06/21/2019, 07/12/2019   ??? Influenza Vaccine Quad (IIV4 PF) 11mo+ injectable 01/12/2015, 01/13/2016, 01/09/2017   ??? Influenza Virus Vaccine, unspecified formulation 01/30/2018, 02/20/2019   ??? PNEUMOCOCCAL POLYSACCHARIDE 23 09/09/2014   ??? Pneumococcal Conjugate 13-Valent 05/31/2019       __________________________________________________________________________________________    SCREENINGS COMPLETED IN FLOWSHEETS    HARK Screening       AUDIT       PHQ2       PHQ9          P4 Suicidality Screener                GAD7       COPD Assessment       Falls Risk

## 2019-10-02 NOTE — Unmapped (Signed)
Internal Medicine Phone Visit    This visit is conducted via telephone.    Contact Information  Person Contacted: Patient  Contact Phone number: [phone] (home)   Is there someone else in the room? No.   Patient agreed to a phone visit    Julia Avila is a 54 y.o. female  participating in a telephone encounter.    Reason for Call  Diabetes follow-up    Subjective:  Reports things have been going well. Joined a gym yesterday. Her friend is a Writer which she plans to start attending some aquatic classes and may try some other classes. 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 Feels less stressed with her current job.    She is trying to make better food choices. Trying to drink more water and less coffee. Drinking less soda (drinks diet) - down to 1 per day. Now drinking unsweet tea. Still eating out a lot, but is choosing less carbohydrates. Trying to eat more vegetables. Her youngest son is 34 and lives with her, but they are on the go and do not eat together.    She is not regularly checking BGs and does not have any to report today. She is interested in trying [employer] in the future.    Medication adherence:  - She ran out of insulin needles for awhile (1 week), but has them now.  - Basaglar 42 units; missing 3-4 doses per week she thinks.  - Glipizide 5mg  PO BID; sometimes misses, but not as often as the injections. Interested in glipizide XL to limit dose administration times  - Fluoxetine is not in her pill box; unclear why and she was surprised by this when she reviewed her bottles.  - She does not keep levothyroxine in her pillbox because she knows that it should be on taken on an empty stomach. She reports taking it ~1x per week. Last fill date 09/28/18 for 90 day supply (more than 52 year old)      Timing of meds is difficult for her with her different work schedules. She does use a pillbox which she fills 1 month at a time. She is interested in pill packs from a pharmacy.    Reviewed bottles:  - she has 3 full bottles of fluoxetine 40mg  (sig 80mg  PO daily) that she has not been taking  - not taking levothyroxine b/c she has been trying to take it on an empty stomach and has not been taking it most day (takes ~1x per week)  - reports that she is taking glipizide, lisinopril, bupropion     I have reviewed the problem list, medications, and allergies and have updated/reconciled them if needed.    Objective:  No acute distress noted in voice    Assessment & Plan:  1. Hypertension associated with diabetes (CMS-HCC)    2. Type 2 diabetes mellitus with microalbuminuria, with long-term current use of insulin (CMS-HCC)    3. Hypothyroidism (acquired)        #Diabetes  - Last A1c above goal, due for A1c now  Lab Results   Component Value Date    A1C 8.6 (H) 05/31/2019   - No SMBG available to assess  - Adherence appears to be the biggest driver  - Plan: Ordered labs to be obtained today at [facility] in [place]. Visit with me in 2 weeks  to discuss med changes. For now, will continue basaglar 42 units nightly and glipizide 5mg  BID. I will consider the following changes: change glipizide to XL 10mg  daily, start SGLT2i once daily, send updated rx for Basaglar dose to pharmacy with pen needles. Investigate [employer] coverage/out of pocket cost    #HTN  - No BP available to assess today, but last BP above goal  - reports fair adherence to lisinopril  - check SCr, K, Na today. Consider addition of SGLT2i vs changing lisinopril 20mg  daily to lisinopril/hctz.    #hypothyroidism  - Non-adherent to prescribed levothyroxine with likely less than weekly administration of levothyroxine .  - Check TSH today to guide therapy. Will likely recommend administering levothyroxine WITH other AM meds and with food (if desired by patient) to improve adherence. Will adjust levothyroxine dose to accommodate impaired absorption if needed    #depression  - Not taking fluoxetine 80mg  daily. Removed from med list today.  - continues to take bupropion XL 150mg  with some degree of adherence (fill dates 04/24/19, 07/13/19)  PHQ-9 PHQ-9 TOTAL SCORE   04/24/2019 3   04/27/2018 7   11/17/2017 10   05/02/2017 12   11/07/2016 20       #Medication adherence  - This has been an ongoing challenge for her  - Will work to simplify regimen to once daily oral meds  - I called and identified 3 options for pill packaging near her:  1) [facility] [phone]:Free pill packs, outside of delivery range   2) [facility]: Bubble packaging (Spoke with Aundra Millet, there is a charge for packaging, ~$10/month);   3) [facility] (outside of delivery range)  - Once I have received lab results I will discuss these pharmacy options with her along with recommend regimen    Return in about 2 weeks (around 10/16/2019) for Diabetes by phone at 1240p.           I spent 34 minutes on the phone with the patient on the date of service. I spent an additional 40 minutes on pre- and post-visit activities on the date of service.     The patient was physically located in [place] or a state in which I am permitted to provide care. The patient and/or parent/guardian understood that s/he may incur co-pays and cost sharing, and agreed to the telemedicine visit. The visit was reasonable and appropriate under the circumstances given the patient's presentation at the time.    The patient and/or parent/guardian has been advised of the potential risks and limitations of this mode of treatment (including, but not limited to, the absence of in-person examination) and has agreed to be treated using telemedicine. The patient's/patient's family's questions regarding telemedicine have been answered.     If the visit was completed in an ambulatory setting, the patient and/or parent/guardian has also been advised to contact their provider???s office for worsening conditions, and seek emergency medical treatment and/or call 911 if the patient deems either necessary.

## 2019-10-02 NOTE — Unmapped (Signed)
[facility] at [facility]       Type of visit:  telephone    Reason for visit: sore mouth    Questions / Concerns that need to be addressed:    General Consent to Treat (GCT) for non-epic video visits only: Verbal consent      Diabetes:  ??? Regularly checking blood sugars?: no  o If yes, when? Complete log for past 7 days  Date Before Breakfast After Breakfast Before Lunch After Lunch Before Dinner After Dinner Before Bed                                                                                                                                       Screening BP-         [employer] BPs (complete if screening BP has a systolic  > 139 or diastolic > 89)  BP#1    BP#2   BP#3     Average BP   (please note this as a comment in vitals)         Allergies reviewed: Yes    Medication reviewed: Yes  Pended refills? No        HCDM reviewed and updated in Epic:    We are working to make sure all of our patients??? wishes are updated in Epic and part of that is documenting a Environmental health practitioner for each patient  A Health Care Decision Rodena Piety is someone you choose who can make health care decisions for you if you are not able ??? who would you most want to do this for you????  was updated.        BPAs completed:  HARK - Interpersonal Violence      COVID-19 Vaccine Summary  Type:  Complete  Dates Given:  07/12/2019                   I    Immunization History   Administered Date(s) Administered   ??? COVID-19 VACC,MRNA,(PFIZER)(PF)(IM) 06/21/2019, 07/12/2019   ??? Influenza Vaccine Quad (IIV4 PF) 50mo+ injectable 01/12/2015, 01/13/2016, 01/09/2017   ??? Influenza Virus Vaccine, unspecified formulation 01/30/2018, 02/20/2019   ??? PNEUMOCOCCAL POLYSACCHARIDE 23 09/09/2014   ??? Pneumococcal Conjugate 13-Valent 05/31/2019       __________________________________________________________________________________________    SCREENINGS COMPLETED IN FLOWSHEETS    HARK Screening  HARK Screening  Within the last year, have you been humiliated or emotionally abused in other ways by your partner or ex-partner?: No  Within the last year, have you been afraid of your partner or ex-partner?: No  Within the last year, have you been raped or forced to have any kind of sexual activity by your partner or ex-partner?: No  Within the last year, have you been kicked, hit, slapped, or otherwise physically hurt by your partner or ex-partner?: No    AUDIT       PHQ2  PHQ9          P4 Suicidality Screener                GAD7       COPD Assessment       Falls Risk

## 2019-10-02 NOTE — Unmapped (Signed)
It was great to talk to you today!    It is ok to start taking the gabapentin. I would recommend taking this at night as it can make you sleepy when you first start taking.     I have also sent a referral to the allergist. They will call you to schedule this appointment.

## 2019-10-03 ENCOUNTER — Institutional Professional Consult (permissible substitution): Admit: 2019-10-03 | Discharge: 2019-10-04 | Payer: PRIVATE HEALTH INSURANCE

## 2019-10-03 NOTE — Unmapped (Signed)
I was the supervising physician in the delivery of the service. Kaiven Vester R Felipe Paluch, MD

## 2019-10-03 NOTE — Unmapped (Signed)
Called to reschedule 

## 2019-10-03 NOTE — Unmapped (Signed)
Reason for call:     Left msg to call back to schedule with Danne Harbor first at her next available and then with me in 4 months after Aubrey's scheduled visit. (per Ishizawar msg 10/03/19)    Last ov: Visit date not found  Next ov: Visit date not found

## 2019-10-03 NOTE — Unmapped (Signed)
October 03, 2019 8:42 AM    REASON FOR VISIT: follow-up for PsA    Patient location: [phone], [place] home. Called at 8:45 am and no answer. Left message of my attempt. Called patient back at 8:52 am and again no answer. Left message. Patient no show with two attempts. Unable to call again as have additional patients at this point.

## 2019-10-03 NOTE — Unmapped (Signed)
Message has been left for patient to call and schedule the follow ups.     Julia Avila

## 2019-10-04 LAB — SODIUM
SODIUM: 135 mmol/L (ref 134–144)
Sodium:SCnc:Pt:Ser/Plas:Qn:: 135

## 2019-10-04 LAB — CHOLESTEROL, TOTAL: Cholesterol:MCnc:Pt:Ser/Plas:Qn:: 300 — ABNORMAL HIGH

## 2019-10-04 LAB — THYROID STIMULATING HORMONE: Thyrotropin:ACnc:Pt:Ser/Plas:Qn:Detection limit <= 0.005 mIU/L: 4.69 — ABNORMAL HIGH

## 2019-10-04 LAB — CREATININE
CREATININE: 0.58 mg/dL (ref 0.57–1.00)
Creatinine:MCnc:Pt:Ser/Plas:Qn:: 0.58

## 2019-10-04 LAB — HDL CHOLESTEROL: Cholesterol.in HDL:MCnc:Pt:Ser/Plas:Qn:: 35 — ABNORMAL LOW

## 2019-10-04 LAB — LDL CHOLESTEROL DIRECT: Cholesterol.in LDL:MCnc:Pt:Ser/Plas:Qn:Direct assay: 186 — ABNORMAL HIGH

## 2019-10-04 LAB — POTASSIUM: Potassium:SCnc:Pt:Ser/Plas:Qn:: 4.4

## 2019-10-04 LAB — HEMOGLOBIN A1C: Hemoglobin A1c/Hemoglobin.total:MFr:Pt:Bld:Qn:: 10.3 — ABNORMAL HIGH

## 2019-10-07 ENCOUNTER — Encounter
Admit: 2019-10-07 | Discharge: 2019-10-08 | Payer: PRIVATE HEALTH INSURANCE | Attending: Registered" | Primary: Registered"

## 2019-10-07 DIAGNOSIS — E1129 Type 2 diabetes mellitus with other diabetic kidney complication: Principal | ICD-10-CM

## 2019-10-07 DIAGNOSIS — R809 Proteinuria, unspecified: Principal | ICD-10-CM

## 2019-10-07 DIAGNOSIS — Z794 Long term (current) use of insulin: Principal | ICD-10-CM

## 2019-10-07 NOTE — Unmapped (Signed)
[facility] Nutrition-Initial Video Assessment    Nutrition Assessment:  Patient is a pleasant 54 y.o. female who comes today desiring to improve sugar control. Patient works multiple jobs which makes her schedule very difficult. Patient has a hard time planning or keeping structure so she needs quick easy things to eat. Provided patient with the ideas below. Encouraged carb intake as she often skips but then has a hard time saying no other times.    Nutrition Diagnosis:  Overweight/obesity related to undesirable food choices as evidenced by skipping meals, high fast food intake    Patient Stated Health/Nutrition Goals:  Meet nutritional needs   Reduce long-term health risk    Identified treatment goals: A1c less than 7.0    Nutrition Interventions (ADVISE/ASSIST):  1. Grab and Go options:   A. Protein: Cheese, Chicken/Tuna salad, Edamame, Vanilla/Plain Greek yogurt, boiled eggs, beef jerky   B. Healthy fats: nuts, seeds, peanut butter, avocado, olives   C. Carbs: whole grain crackers, bread   D. Fruit: fruit cups in juice/water, apples etc, fruit trays   E. Vegetables: vegetable trays  2. Recommend Vitamin D supplement of 2000 units a day    ______________________________________________________________________  Julia Avila is a 54 y.o. female seen for medical nutrition therapy.    Referring MD or Clinic:   Self, Referred  Amy Johney Frame, MD    Reason for Referral:   1. Type 2 diabetes mellitus with microalbuminuria, with long-term current use of insulin (CMS-HCC)        Medical History:  Past Medical History:   Diagnosis Date   ??? Diabetes mellitus (CMS-HCC)    ??? Hypertension    ??? Hypothyroidism    ??? Nephrolithiasis    ??? Psoriasis     Typically on ears and scalp and around eyelids (arthritis is dominant finding typically for patient)   ??? Psoriatic arthritis (CMS-HCC)     Embril for 7 years prior to starting Humara; hips, shoulders, back, hands sometimes   ??? Vitamin D deficiency        Anthropometrics:    Present Weight   Current BMI     Goal Weight   160# Current IBW     Present Height         Weight History:  Wt Readings from Last 6 Encounters:   10/02/19 81.6 kg (180 lb)   10/02/19 81.6 kg (180 lb)   07/31/19 82.6 kg (182 lb)   05/31/19 83.3 kg (183 lb 11.2 oz)   05/01/19 80.7 kg (178 lb)   04/24/19 81.6 kg (180 lb)       Medications, Herbs, Supplements:  Reviewed nutritionally relevant medications and supplements.    Recent Labs:   HGB A1C, POC (%)   Date Value   11/17/2017 11.2 (H)   04/26/2017 12.0 (H)   01/09/2017 11.9 (H)     Hemoglobin A1c (%)   Date Value   10/03/2019 10.3 (H)     Hemoglobin A1C (%)   Date Value   05/31/2019 8.6 (H)   01/18/2019 9.7 (H)      No components found for: LDLCALC   BP Readings from Last 3 Encounters:   07/31/19 131/82   05/31/19 138/79   04/24/19 127/91     Lab Results   Component Value Date    CHOL 300 (H) 10/03/2019    CHOL 235 (H) 09/09/2014     Lab Results   Component Value Date    HDL 35 (L) 10/03/2019  HDL 36 (L) 09/09/2014     Lab Results   Component Value Date    LDL 186 (H) 10/03/2019     No results found for: VLDL  No results found for: CHOLHDLRATIO  No results found for: TRIG      Physical Activity:  Joined the [employer] but hasn't gone yet    Dietary Restrictions, Food Intolerances:  None  Does seem to have some issues with meat    Other GI Issues:  Occasional nausea  Bloating      Allergies:   Allergies   Allergen Reactions   ??? 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    Behavioral Risk Factors:  Emotional eating- Yes  Skipping meals- Yes  Grazing- Yes  Night eating- Yes  Fast Eating  Yes  Overeating  Can be    Hunger and Satiety Issues:  Appetite: Always Hungry    Patient Questions:  Needs some structure    Estimated Needs:  Estimated Energy Needs: 1600-1900 kcal/day    Estimated Protein Needs: 55-65 gm/day    Materials Provided To Meet their identified goals:  List of recommendations    Food labels discussed: Yes    Expected Compliance/Barriers are:   Comprehension of plan good  Readiness for change good  Ability to meet goals good    We assessed family/social/cultural characteristics and these were relevant to care: none evident    Patient has auditory or visual communication barrier or need: no    Interventions Codes:  General/healthful diet     Follow-up (ARRANGE):  4 weeks      Length of visit was 45 minutes. Patient understands diagnosis and treatment plan. Patient voices understanding. Plan is consistent with the patient???s preferences and goals.

## 2019-10-07 NOTE — Unmapped (Signed)
Patient Stated Health/Nutrition Goals:  Meet nutritional needs   Reduce long-term health risk    Identified treatment goals: A1c less than 7.0    Nutrition Interventions (ADVISE/ASSIST):  1. Grab and Go options:   A. Protein: Cheese, Chicken/Tuna salad, Edamame, Vanilla/Plain Greek yogurt, boiled eggs, beef jerky   B. Healthy fats: nuts, seeds, peanut butter, avocado, olives   C. Carbs: whole grain crackers, bread   D. Fruit: fruit cups in juice/water, apples etc, fruit trays   E. Vegetables: vegetable trays  2. Recommend Vitamin D supplement of 2000 units a day

## 2019-10-08 ENCOUNTER — Institutional Professional Consult (permissible substitution): Admit: 2019-10-08 | Discharge: 2019-10-09 | Payer: PRIVATE HEALTH INSURANCE

## 2019-10-08 DIAGNOSIS — L4050 Arthropathic psoriasis, unspecified: Principal | ICD-10-CM

## 2019-10-08 NOTE — Unmapped (Signed)
I spent 19 minutes on the phone visit with the patient on the date of service. I spent an additional 10 minutes on pre- and post-visit activities on the date of service.     The patient was not located and I was not located within 250 yards of a hospital based location during the phone visit. The patient was physically located in [place] or a state in which I am permitted to provide care. The patient and/or parent/guardian understood that s/he may incur co-pays and cost sharing, and agreed to the telemedicine visit. The visit was reasonable and appropriate under the circumstances given the patient's presentation at the time.    The patient and/or parent/guardian has been advised of the potential risks and limitations of this mode of treatment (including, but not limited to, the absence of in-person examination) and has agreed to be treated using telemedicine. The patient's/patient's family's questions regarding telemedicine have been answered.    If the visit was completed in an ambulatory setting, the patient and/or parent/guardian has also been advised to contact their provider???s office for worsening conditions, and seek emergency medical treatment and/or call 911 if the patient deems either necessary.    REASON FOR VISIT: f/u psoriatic arthritis    Identification: Pt self identified using name and date of birth  Patient location: [place]  The limitations of this telemedicine encounter were discussed with patient. Both the patient and myself agreed to this encounter despite these limitations. Benefits of this telemedicine encounter included allowing for continued care of patient and minimizing risk of exposure to COVID-19.        HISTORY: Julia Avila is a 54 y.o. female with hx of psoriasis (since childhood) and psoriatic arthritis. Has had peripheral and axial involvement.   Treatment hx:   - mtx w/o relief  - Enbrel with good results for 9 years, then loss of efficacy  - Enbrel changed to humira in 2015, gaps in therapy due to recurrent UTIs, found to have nephrolithiasis s/p lithotripsy and ureteral dilation.   Henderson [place] w/o efficacy  - Simponi w/o efficacy  - Cosentyx since 2016.   - Current med regimen: cosentyx 300 mg q 28 days monotherapy     Interim history:  Presents today for follow-up.    She feels she has been doing well with respect to her arthritis.  She has a little low back pain that comes and goes, but she is not really sure this is related to the psoriatic arthritis.  She thinks this may be at least partially related to age.  She goes to the chiropractor when needed with some improvement.  She notes some flaring of skin psoriasis intermittently on the ears and scalp.  She does have some small lesions on the ears and scalp today.  She is also had trouble with skin itching and was recently started on gabapentin for itching.  She was also started on vitamin D recently and wonders if this will help her skin.    She endorses great difficulty in remembering to take her medications as prescribed.  She admits that she typically takes Cosentyx every 4 to 6 weeks rather than every 4 weeks as written.  She has tried calendars, reminders on her phone, and medication reminder apps, all without any luck.  She has a similar difficulty with all of her medications, not just the Cosentyx.  She endorses that she has spent all of her time taking care of others, but she does not generally put any  effort into take care of herself.  She is working on this.  She is working with her PCP and endocrinologist to come up with a plan to help her with remembering her medications.     Overall she feels her psoriatic arthritis is well controlled and does not wish to change medications.    She has had 4 mechanical falls in the interim.  Most recent fall was last month in her yard, tripped in a hole.  No significant injuries, did not seek medical attention.      CURRENT MEDICATIONS:  Current Outpatient Medications   Medication Sig Dispense Refill   ??? BASAGLAR KWIKPEN U-100 INSULIN 100 unit/mL (3 mL) injection pen INJECT 12 UNITS UNDER THE SKIN NIGHTLY AS DIRECTED     ??? blood sugar diagnostic (GLUCOSE BLOOD) Strp Test blood sugars three times daily. ICD-10 E11.9 100 strip 11   ??? blood-glucose meter kit Use as instructed 1 each 0   ??? blood-glucose meter Misc Use to check blood sugars three times daily. ICD10 E11.9 1 each 0   ??? buPROPion (WELLBUTRIN XL) 150 MG 24 hr tablet Take 1 tablet (150 mg total) by mouth every morning. restarted 90 tablet 0   ??? clobetasoL (TEMOVATE) 0.05 % external solution Apply topically Two (2) times a day. 50 mL 0   ??? gabapentin (NEURONTIN) 300 MG capsule Take 1 tablet by mouth daily for 1 week, then increase to 1 tablet twice daily. 60 capsule 3   ??? glipiZIDE (GLUCOTROL) 5 MG tablet Take 1 tablet(5 mg tab) by mouth daily (Patient taking differently: Take 5 mg by mouth Two (2) times a day (30 minutes before a meal). ) 60 tablet 11   ??? ibuprofen (ADVIL,MOTRIN) 200 MG tablet Take 200 mg by mouth every six (6) hours as needed for pain.     ??? inhalational spacing device Spcr Use with inhaler to maximize medication delivery to lungs (Patient not taking: Reported on 10/02/2019) 1 each 0   ??? insulin glargine (BASAGLAR) injection pen Inject 12 Units under the skin nightly. or as directed (Patient taking differently: Inject 42 Units under the skin nightly. or as directed) 45 mL 3   ??? lancets Misc 1 each by Other route once daily. Test daily before breakfast. Dispense 90 day supply. 100 each 3   ??? levothyroxine (SYNTHROID, LEVOTHROID) 75 MCG tablet Take 1 tablet (75 mcg total) by mouth daily. 90 tablet 2   ??? lisinopril (PRINIVIL,ZESTRIL) 20 MG tablet Take 1 tablet (20 mg total) by mouth daily. 90 tablet 3   ??? mometasone (ELOCON) 0.1 % lotion Apply with cotton ball to ear canals t bilaterally once every 2weeks 60 mL 0   ??? mupirocin (BACTROBAN) 2 % ointment Apply topically to open areas on arms twice daily as needed. 30 g 4   ??? pen needle, diabetic (BD ULTRA-FINE NANO PEN NEEDLES) 32 gauge x 5/32 Ndle Needles for insulin pen. Inject daily as instructed. ICD-10 E11.9 30 each 0   ??? pravastatin (PRAVACHOL) 40 MG tablet Take 1 tablet (40 mg total) by mouth daily. 90 tablet 3   ??? secukinumab (COSENTYX) 150 mg/mL PnIj injection Inject the contents of 2 pens (300mg  total) under the skin every 28 days 6 mL 3   ??? triamcinolone (KENALOG) 0.1 % ointment Apply topically Two (2) times a day. 80 g 2     No current facility-administered medications for this visit.       Past Medical History:   Diagnosis Date   ???  Diabetes mellitus (CMS-HCC)    ??? Hypertension    ??? Hypothyroidism    ??? Nephrolithiasis    ??? Psoriasis     Typically on ears and scalp and around eyelids (arthritis is dominant finding typically for patient)   ??? Psoriatic arthritis (CMS-HCC)     Embril for 7 years prior to starting Humara; hips, shoulders, back, hands sometimes   ??? Vitamin D deficiency         Record Review: Available records were reviewed, including pertinent office visits, labs, and imaging.      REVIEW OF SYSTEMS: Ten system were reviewed and negative except as noted above.    PHYSICAL EXAM:  Patient reported vitals:  There were no vitals filed for this visit.   General:   Does not sound to be in distress   Lungs:  No wheezing, coughing, or increased respiratory effort noted   Psych:  Appropriate interaction         ASSESSMENT/PLAN:  1. Psoriatic arthritis (CMS-HCC)  Some concern for active psoriatic arthritis given intermittent low back pain, though not really able to assess over phone visit.  She is overall happy with her current disease control.  I encouraged better compliance to every 4 week dosing of Cosentyx.  We will not make any further changes to medications today.  Cosentyx 300 mg q. 28 days.        HCM:   - PCV13 Status: 05/31/2019  - PPSV 23 Status: 09/09/14  - Annual Influenza vaccine. Status: 02/20/19  - Bone health: not on prednisone   - Contraception: Postmenopausal - COVID-19 vaccine: completed        Return appointment in 4 mo with myself in person and in 8 months with Dr Scarlette Calico in person

## 2019-10-16 ENCOUNTER — Encounter
Admit: 2019-10-16 | Discharge: 2019-10-17 | Payer: PRIVATE HEALTH INSURANCE | Attending: Pharmacist Clinician (PhC)/ Clinical Pharmacy Specialist | Primary: Pharmacist Clinician (PhC)/ Clinical Pharmacy Specialist

## 2019-10-16 MED ORDER — GABAPENTIN 300 MG CAPSULE: capsule | 3 refills | 0 days | Status: AC

## 2019-10-16 MED ORDER — OZEMPIC 0.25 MG OR 0.5 MG (2 MG/1.5 ML) SUBCUTANEOUS PEN INJECTOR: 0 mg | mL | 1 refills | 56 days | Status: AC

## 2019-10-16 MED ORDER — PEN NEEDLE, DIABETIC 32 GAUGE X 5/32" (4 MM): each | 3 refills | 0 days | Status: AC

## 2019-10-16 MED ORDER — LISINOPRIL 5 MG TABLET: 5 mg | tablet | Freq: Every day | 3 refills | 90 days | Status: AC

## 2019-10-16 MED ORDER — FREESTYLE LIBRE 14 DAY SENSOR KIT
PACK | 11 refills | 0.00000 days | Status: CP
Start: 2019-10-16 — End: 2019-10-16

## 2019-10-16 MED ORDER — FREESTYLE LIBRE 14 DAY SENSOR KIT: kit | 11 refills | 0 days | Status: AC

## 2019-10-16 MED ORDER — ATORVASTATIN 40 MG TABLET: 40 mg | tablet | Freq: Every day | 3 refills | 90 days | Status: AC

## 2019-10-16 MED ORDER — LISINOPRIL 5 MG TABLET
ORAL_TABLET | Freq: Every day | ORAL | 3 refills | 90.00000 days | Status: CP
Start: 2019-10-16 — End: 2020-10-15

## 2019-10-16 MED ORDER — OZEMPIC 0.25 MG OR 0.5 MG (2 MG/1.5 ML) SUBCUTANEOUS PEN INJECTOR
SUBCUTANEOUS | 1 refills | 56.00000 days | Status: CP
Start: 2019-10-16 — End: 2019-10-16

## 2019-10-16 MED ORDER — PEN NEEDLE, DIABETIC 32 GAUGE X 5/32" (4 MM)
3 refills | 0.00000 days | Status: CP
Start: 2019-10-16 — End: 2019-10-16

## 2019-10-16 MED ORDER — ATORVASTATIN 40 MG TABLET
ORAL_TABLET | Freq: Every day | ORAL | 3 refills | 90.00000 days | Status: CP
Start: 2019-10-16 — End: 2020-10-15

## 2019-10-16 MED ORDER — GABAPENTIN 300 MG CAPSULE
ORAL_CAPSULE | ORAL | 3 refills | 0.00000 days | Status: CP
Start: 2019-10-16 — End: 2019-10-16

## 2019-10-16 MED ORDER — BASAGLAR 100 UNIT/ML (3 ML) SUBCUTANEOUS PEN
Freq: Every evening | SUBCUTANEOUS | 3 refills | 107.00000 days | Status: CP
Start: 2019-10-16 — End: 2019-10-16

## 2019-10-16 MED ORDER — BASAGLAR 100 UNIT/ML (3 ML) SUBCUTANEOUS PEN: 42 [IU] | mL | Freq: Every evening | 3 refills | 107 days | Status: AC

## 2019-10-16 MED ORDER — BUPROPION HCL XL 150 MG 24 HR TABLET, EXTENDED RELEASE: 150 mg | tablet | Freq: Every morning | 3 refills | 90 days | Status: AC

## 2019-10-16 MED ORDER — BUPROPION HCL XL 150 MG 24 HR TABLET, EXTENDED RELEASE
ORAL_TABLET | Freq: Every morning | ORAL | 3 refills | 90.00000 days | Status: CP
Start: 2019-10-16 — End: 2019-10-16

## 2019-10-16 NOTE — Unmapped (Addendum)
We will work on getting pill packs from [employer] [phone]    Your pill packs will have:  1) Lisinopril 5mg  by mouth once daily (for blood pressure and kidney protection)  2) Atorvastatin 40mg  by mouth once daily (for high cholesterol and to prevent heart attacks or strokes)  3) Bupropion 150mg  by mouth every morning (mood)    For diabetes:  1) Continue Basaglar insulin 42 units once every day  2) Start Ozempic 0.25mg  once every 7 days (instructions here: [email or web address])

## 2019-10-16 NOTE — Unmapped (Signed)
Internal Medicine Phone Visit    This visit is conducted via telephone.    Contact Information  Person Contacted: Patient  Contact Phone number: [phone] (home)   Is there someone else in the room? No.   Patient agreed to a phone visit    Julia Avila is a 54 y.o. female  participating in a telephone encounter.    Reason for Call  Diabetes follow-up, hypothyroid    Subjective:  Reports things have been going well. She went to the gym once. Now has small weights that she is doing some exercises with. Also doing some stairs.    She is working with Melvenia Beam, RD on her diet.    She reports that she plans to start preparing healthier foods with her mom once weekly.    Still not checking blood sugar very often. FBG a couple of days ago was ~190.     She ran out glipizide at least 2 weeks ago (bottle was empty during our last call).  Basaglar 42 units every morning, 1 missed dose since last visit.    No S/sx of hypoglycemia since last visit    She is not regularly checking BGs and does not have any to report today. She is interested in trying [employer] in the future.    I have reviewed the problem list, medications, and allergies and have updated/reconciled them if needed.    Objective:  No acute distress noted in voice    Assessment & Plan:  1. Essential hypertension    2. Pruritus        #Diabetes  -A1c above goal  Lab Results   Component Value Date    A1C 10.3 (H) 10/03/2019   - Failed metformin due to diarrhea with fecal incontinence; non-adherent with glipizide. Recent improvement in adherence with Basaglar 42 units daily (missing 1 day per week now)  - Only 1 FBG which was above goal, no hypoglycemia.  - obese and would benefit from GLP1 RA or SGLT2i. She would like to try GLP1 RA.  - Plan: START Ozempic 0.25mg  q7 day (increase to 0.5mg  in 4 weeks (~11/13/19), Continue Basaglar 42 units daily. Stop glipizide  - SMBG fasting daily. Sent rx for [employer] to assist with home monitoring (per pt request). Patient advised that this may require a prior authorization by her insurance  company    #HTN, microalbuminuria  - No BP available to assess today, but last BP 131/82 07/2019 (slightly above goal)  - urine ACR 133 02/20/19  - given non-adherence with lisinopril, I recommend starting lisinopril 5mg , not 20mg  to avoid hypotension  - check SCr, K in 2 weeks (to order for [facility] at next visit)  - pt to buy BP cuff and check home BP before next visit    #hypothyroidism  - Non-adherent to prescribed levothyroxine with likely less than weekly administration of levothyroxine  - TSH borderline (4690 10/03/19) despite non-adherence  - recommend stopping (really not restarting) levothyroxine. Check TSH in 8 to 12 weeks    #depression  - continue bupropion XL 150mg  with some degree of adherence (fill dates 04/24/19, 07/13/19)  PHQ-9 PHQ-9 TOTAL SCORE   04/24/2019 3   04/27/2018 7   11/17/2017 10   05/02/2017 12   11/07/2016 20       #Medication adherence  - Setup pill packaging through [employer]; called pharmacy today to coordinate  - Once I have received lab results I will discuss these pharmacy options with her  along with recommend regimen    Return in about 2 weeks (around 10/30/2019) for [facility] by phone .           I spent 33 minutes on the phone with the patient on the date of service. I spent an additional 30 minutes on pre- and post-visit activities on the date of service.     The patient was physically located in [place] or a state in which I am permitted to provide care. The patient and/or parent/guardian understood that s/he may incur co-pays and cost sharing, and agreed to the telemedicine visit. The visit was reasonable and appropriate under the circumstances given the patient's presentation at the time.    The patient and/or parent/guardian has been advised of the potential risks and limitations of this mode of treatment (including, but not limited to, the absence of in-person examination) and has agreed to be treated using telemedicine. The patient's/patient's family's questions regarding telemedicine have been answered.     If the visit was completed in an ambulatory setting, the patient and/or parent/guardian has also been advised to contact their provider???s office for worsening conditions, and seek emergency medical treatment and/or call 911 if the patient deems either necessary.

## 2019-10-16 NOTE — Unmapped (Signed)
[facility] at [facility]       Type of visit:  telephone    Reason for visit: Follow up    Questions / Concerns that need to be addressed: Patient reported she checked her BS a couple of days ago and her reading was 198.    General Consent to Treat (GCT) for non-epic video visits only: Verbal consent      Diabetes:  ??? Regularly checking blood sugars?: no  o If yes, when? Complete log for past 7 days  Date Before Breakfast After Breakfast Before Lunch After Lunch Before Dinner After Dinner Before Bed    6/28 198                                                                                                                             HCDM reviewed and updated in Epic:    We are working to make sure all of our patients??? wishes are updated in Epic and part of that is documenting a Environmental health practitioner for each patient  A Health Care Decision Rodena Piety is someone you choose who can make health care decisions for you if you are not able ??? who would you most want to do this for you????  is already up to date.    HCDM (patient stated preference): Adella Nissen - Mother - [phone]    BPAs completed:  PHQ2  AUDIT - Alcohol Screen  Falls Risk - adults 65+      COVID-19 Vaccine Summary  Type:  Complete  Dates Given:  07/12/2019          Immunization History   Administered Date(s) Administered   ??? COVID-19 VACC,MRNA,(PFIZER)(PF)(IM) 06/21/2019, 07/12/2019   ??? Influenza Vaccine Quad (IIV4 PF) 43mo+ injectable 01/12/2015, 01/13/2016, 01/09/2017   ??? Influenza Virus Vaccine, unspecified formulation 01/30/2018, 02/20/2019   ??? PNEUMOCOCCAL POLYSACCHARIDE 23 09/09/2014   ??? Pneumococcal Conjugate 13-Valent 05/31/2019       __________________________________________________________________________________________    SCREENINGS COMPLETED IN FLOWSHEETS    AUDIT  AUDIT - C Score (Part 1): 0    PHQ2  PHQ-2 Total Score : 0    Falls Risk  Falls Risk  Have you fallen in the past year?: (!) Yes  Do you feel unsteady when standing or walking?: No

## 2019-10-17 NOTE — Unmapped (Signed)
[facility] Refill Coordination Note    Specialty Medication(s) to be Shipped:   Inflammatory Disorders: Cosentyx    Other medication(s) to be shipped: n/a     Julia Avila, DOB: 1966-01-05  Phone: [phone] (home)       All above HIPAA information was verified with patient.     Was a Nurse, learning disability used for this call? No    Completed refill call assessment today to schedule patient's medication shipment from the [facility] [phone]).       Specialty medication(s) and dose(s) confirmed: Regimen is correct and unchanged.   Changes to medications: Aleksandra reports no changes at this time.  Changes to insurance: No  Questions for the pharmacist: No    Confirmed patient received Welcome Packet with first shipment. The patient will receive a drug information handout for each medication shipped and additional FDA Medication Guides as required.       DISEASE/MEDICATION-SPECIFIC INFORMATION        For patients on injectable medications: Patient currently has 0 doses left.  Next injection is scheduled for 7/11.    SPECIALTY MEDICATION ADHERENCE     Medication Adherence    Patient reported X missed doses in the last month: 0  Specialty Medication: Cosentyx  Patient is on additional specialty medications: No  Patient is on more than two specialty medications: No  Any gaps in refill history greater than 2 weeks in the last 3 months: no  Demonstrates understanding of importance of adherence: yes  Informant: patient                Cosentyx 150mg /ml: Patient has 0 days of medication on hand      SHIPPING     Shipping address confirmed in Epic.     Delivery Scheduled: Yes, Expected medication delivery date: 7/9.     Medication will be delivered via UPS to the prescription address in Epic WAM.    Olga Millers   [facility] Specialty Technician

## 2019-10-17 NOTE — Unmapped (Signed)
left voicemail. Need to schedule 2 weeks follow up (10/30/19) with [name].    Follow-up disposition: Return in about 2 weeks (around 10/30/2019) for [facility] .     Saddie Benders  10/17/19

## 2019-10-18 MED ORDER — MAGIC MOUTHWASH (NYSTATIN/DIPHENHYDRAMINE/MYLANTA) ORAL MIXTURE
Freq: Four times a day (QID) | ORAL | 0 refills | 0.00000 days | Status: CP | PRN
Start: 2019-10-18 — End: ?

## 2019-10-18 NOTE — Unmapped (Signed)
LVMM

## 2019-10-18 NOTE — Unmapped (Signed)
LVMM - Pt C/O oral thrush

## 2019-10-22 NOTE — Unmapped (Signed)
Prior authorization initiated and sent to plan on 10/22/2019 with/ from [facility] via CoverMyMeds website.    Medication: Ozempic    Reference ID: (Key: [id number]) ??? [id number]     Status: approved: 10/22/2019; coverage dates: effective 10/22/2019 through 10/21/2020.    Pharmacy Notified: no.    Patient Notified: via MyChart.    Provider Notified: yes.    Clinical Information: Approved. N.Raines RMA.

## 2019-10-24 MED FILL — COSENTYX PEN 300 MG/2 PENS (150 MG/ML) SUBCUTANEOUS: 28 days supply | Qty: 2 | Fill #4 | Status: AC

## 2019-10-24 MED FILL — COSENTYX PEN 300 MG/2 PENS (150 MG/ML) SUBCUTANEOUS: SUBCUTANEOUS | 28 days supply | Qty: 2 | Fill #4

## 2019-11-04 ENCOUNTER — Encounter
Admit: 2019-11-04 | Discharge: 2019-11-05 | Payer: PRIVATE HEALTH INSURANCE | Attending: Registered" | Primary: Registered"

## 2019-11-04 DIAGNOSIS — E1129 Type 2 diabetes mellitus with other diabetic kidney complication: Principal | ICD-10-CM

## 2019-11-04 DIAGNOSIS — R809 Proteinuria, unspecified: Principal | ICD-10-CM

## 2019-11-04 DIAGNOSIS — Z794 Long term (current) use of insulin: Principal | ICD-10-CM

## 2019-11-04 NOTE — Unmapped (Signed)
Patient Stated Health/Nutrition Goals:  Meet nutritional needs   Reduce long-term health risk    Identified treatment goals: A1c less than 7.0    Nutrition Interventions (ADVISE/ASSIST):  1. Grab and Go options:   A. Protein: Cheese, Chicken/Tuna salad, Edamame, Vanilla/Plain Greek yogurt, boiled eggs, beef jerky   B. Healthy fats: nuts, seeds, peanut butter, avocado, olives   C. Carbs: whole grain crackers, bread   D. Fruit: fruit cups in juice/water, apples etc, fruit trays   E. Vegetables: vegetable trays  2. Recommend Vitamin D supplement of 2000 units a day  3. Remember you need 45 grams of carbs per meal

## 2019-11-04 NOTE — Unmapped (Signed)
Enhanced Care Nutrition-Follow Up Video Assessment    Nutrition Assessment:  Patient comes today making good changes to her diet. Encouraged patient to keep up the efforts with planning out what she is eating. We discussed carbohydrates today and gave her a number to try and stick with.    Nutrition Diagnosis:  Overweight/obesity related to undesirable food choices as evidenced by skipping meals, high fast food intake    Patient Stated Health/Nutrition Goals:  Meet nutritional needs   Reduce long-term health risk    Identified treatment goals: A1c less than 7.0    Nutrition Interventions (ADVISE/ASSIST):  1. Grab and Go options:   A. Protein: Cheese, Chicken/Tuna salad, Edamame, Vanilla/Plain Greek yogurt, boiled eggs, beef jerky   B. Healthy fats: nuts, seeds, peanut butter, avocado, olives   C. Carbs: whole grain crackers, bread   D. Fruit: fruit cups in juice/water, apples etc, fruit trays   E. Vegetables: vegetable trays  2. Recommend Vitamin D supplement of 2000 units a day  3. Remember you need 45 grams of carbs per meal    ______________________________________________________________________  Julia Avila is a 54 y.o. female seen for medical nutrition therapy.    Referring MD or Clinic:   Self, Referred  Coralee Pesa, MD    Reason for Referral:   1. Type 2 diabetes mellitus with microalbuminuria, with long-term current use of insulin (CMS-HCC)        Medical History:  Past Medical History:   Diagnosis Date   ??? Diabetes mellitus (CMS-HCC)    ??? Hypertension    ??? Hypothyroidism    ??? Nephrolithiasis    ??? Psoriasis     Typically on ears and scalp and around eyelids (arthritis is dominant finding typically for patient)   ??? Psoriatic arthritis (CMS-HCC)     Embril for 7 years prior to starting Humara; hips, shoulders, back, hands sometimes   ??? Vitamin D deficiency        Anthropometrics:    Present Weight   Current BMI     Goal Weight   160# Current IBW     Present Height         Weight History:  Wt Readings from Last 6 Encounters:   10/16/19 81.6 kg (180 lb)   10/02/19 81.6 kg (180 lb)   10/02/19 81.6 kg (180 lb)   07/31/19 82.6 kg (182 lb)   05/31/19 83.3 kg (183 lb 11.2 oz)   05/01/19 80.7 kg (178 lb)       Medications, Herbs, Supplements:  Reviewed nutritionally relevant medications and supplements.    Recent Labs:   HGB A1C, POC (%)   Date Value   11/17/2017 11.2 (H)   04/26/2017 12.0 (H)   01/09/2017 11.9 (H)     Hemoglobin A1c (%)   Date Value   10/03/2019 10.3 (H)     Hemoglobin A1C (%)   Date Value   05/31/2019 8.6 (H)   01/18/2019 9.7 (H)      No components found for: LDLCALC   BP Readings from Last 3 Encounters:   07/31/19 131/82   05/31/19 138/79   04/24/19 127/91     Lab Results   Component Value Date    CHOL 300 (H) 10/03/2019    CHOL 235 (H) 09/09/2014     Lab Results   Component Value Date    HDL 35 (L) 10/03/2019    HDL 36 (L) 09/09/2014     Lab Results   Component Value Date  LDL 186 (H) 10/03/2019     No results found for: VLDL  No results found for: CHOLHDLRATIO  No results found for: TRIG      Physical Activity:  Went to the [employer] once    Dietary Restrictions, Food Intolerances:  None  Does seem to have some issues with meat    Other GI Issues:  Occasional nausea  Bloating      Allergies:   Allergies   Allergen Reactions   ??? Nitrofurantoin Macrocrystal Rash     Large red welts all over her body   ??? 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    Behavioral Risk Factors:  Emotional eating- Yes Skipping meals- Yes  Grazing- Yes  Night eating- Yes  Fast Eating  Yes  Overeating  Can be    Hunger and Satiety Issues:  Appetite: Always Hungry    Patient Questions:  Needs some structure    Estimated Needs:  Estimated Energy Needs: 1600-1900 kcal/day    Estimated Protein Needs: 55-65 gm/day    Materials Provided To Meet their identified goals:  List of recommendations    Food labels discussed: Yes    Expected Compliance/Barriers are:   Comprehension of plan good  Readiness for change good  Ability to meet goals good    We assessed family/social/cultural characteristics and these were relevant to care: none evident    Patient has auditory or visual communication barrier or need: no    Interventions Codes:  General/healthful diet     Follow-up (ARRANGE):  4 weeks      Length of visit was 45 minutes. Patient understands diagnosis and treatment plan. Patient voices understanding. Plan is consistent with the patient???s preferences and goals.

## 2019-11-05 NOTE — Unmapped (Signed)
Called to reschedule 

## 2019-11-17 IMAGING — CT CT TEMPORAL BONES WITHOUT CONTRAST
3 of 8 series · 13 of 40 positions shown, 16 images · non-contrast
Comparison: None.

CLINICAL DATA: Otorrhea.  Recurrent ear infections on right

EXAM:
CT TEMPORAL BONES WITHOUT CONTRAST
TECHNIQUE: Axial and coronal plane CT imaging of the petrous temporal bones was
performed with thin-collimation image reconstruction. No intravenous
contrast was administered. Multiplanar CT image reconstructions were
also generated.

[Series 2: ax bone temperal bones · axial · 0.33mm/px · z∈[-677,-611]mm · 10 of 135 slices shown, 13 images]
[im 13/135  brain]
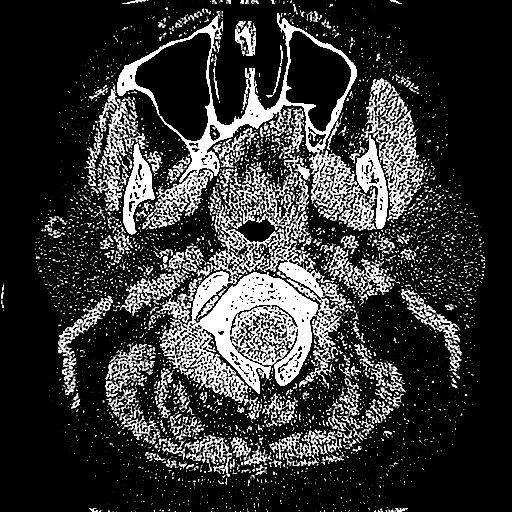
[im 13/135  bone]
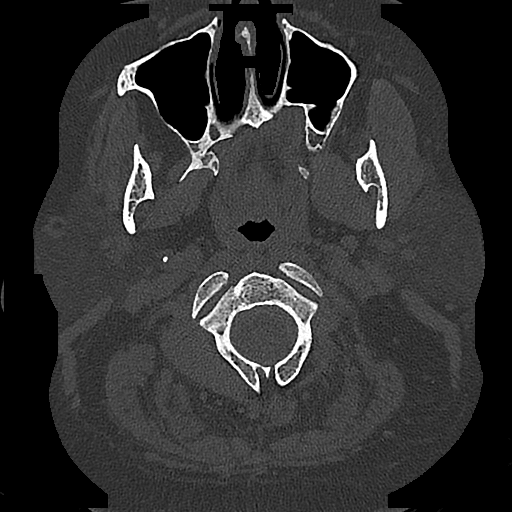
[im 25/135  bone]
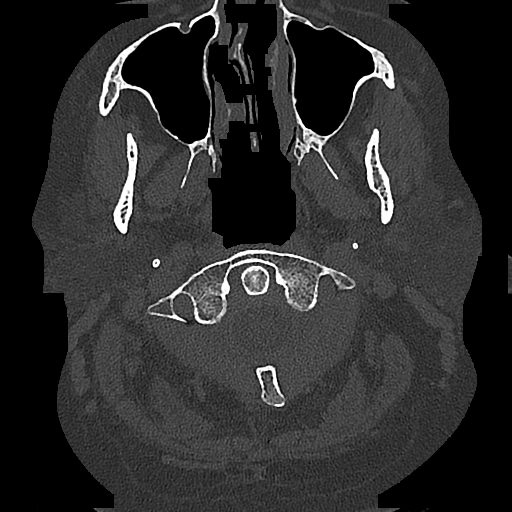
[im 37/135  bone]
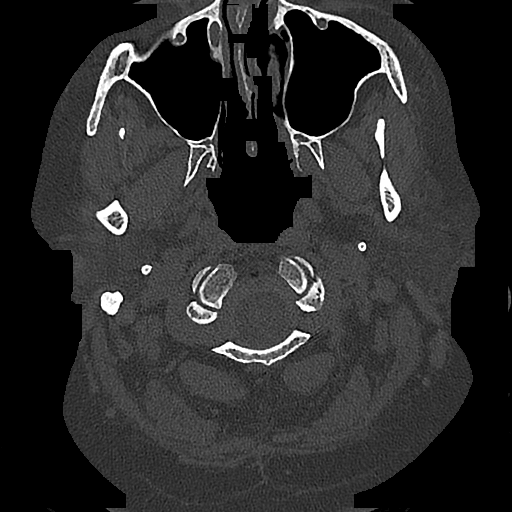
[im 49/135  bone]
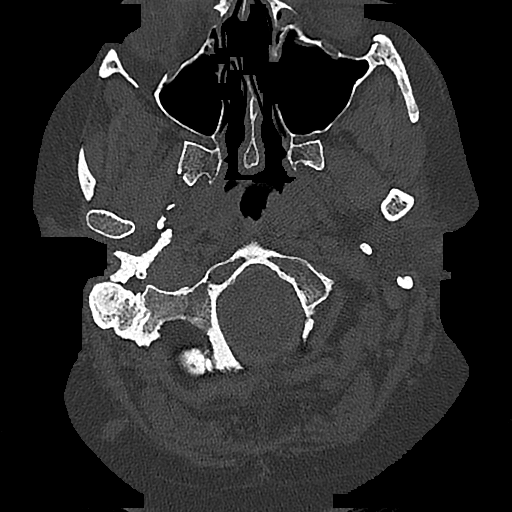
[im 61/135  brain]
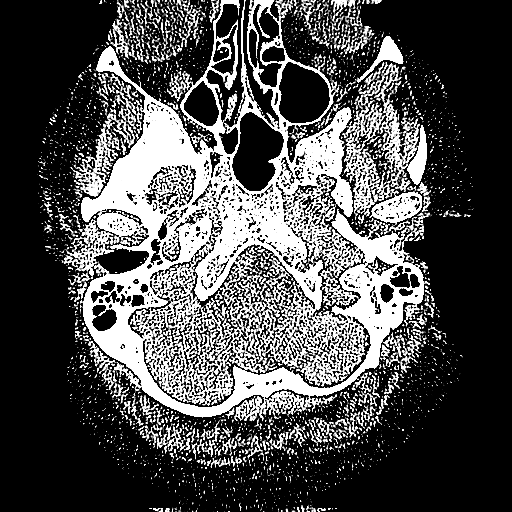
[im 61/135  bone]
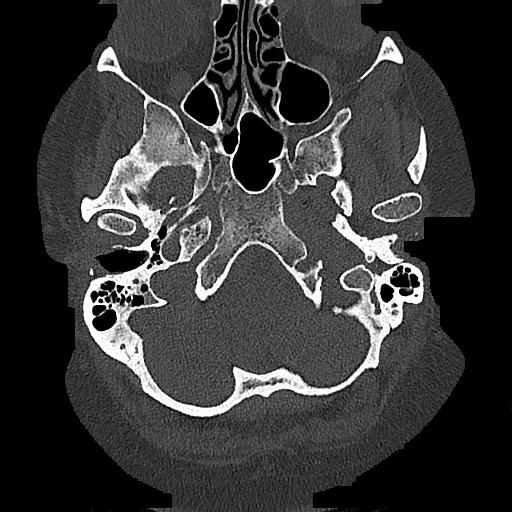
[im 74/135  bone]
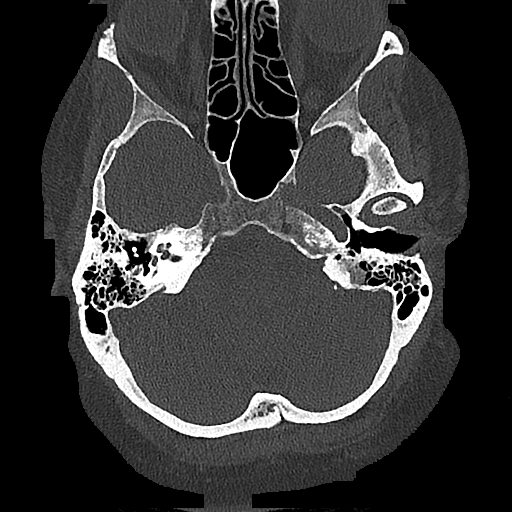
[im 86/135  bone]
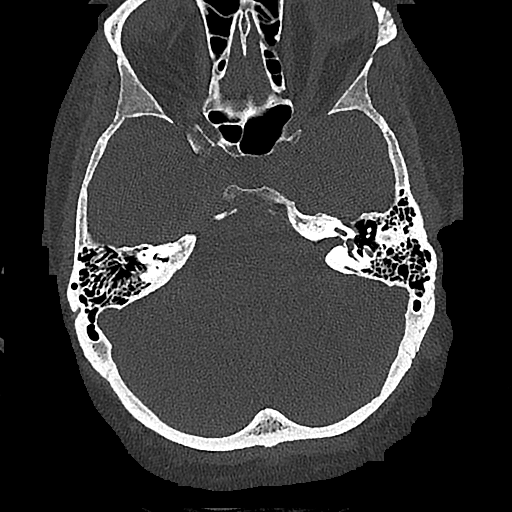
[im 98/135  bone]
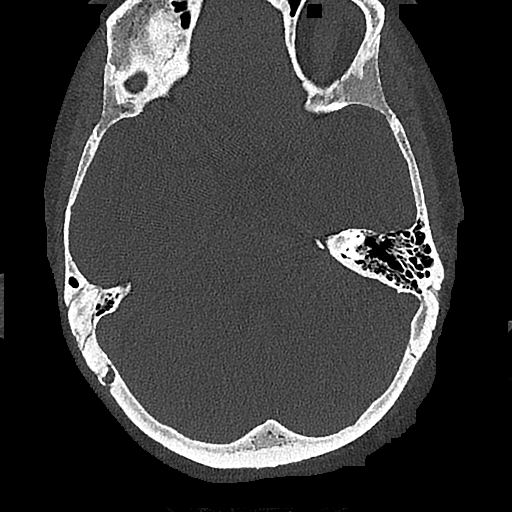
[im 110/135  brain]
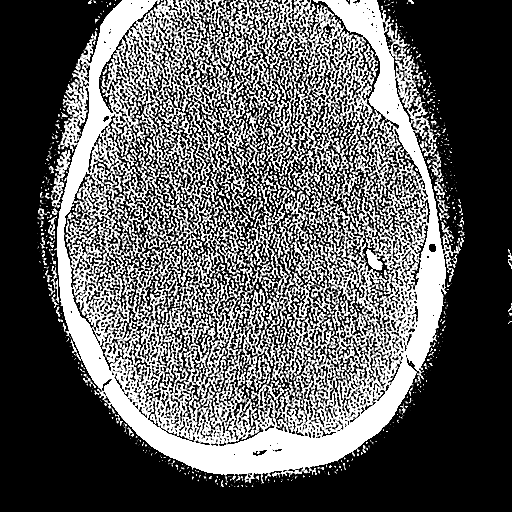
[im 110/135  bone]
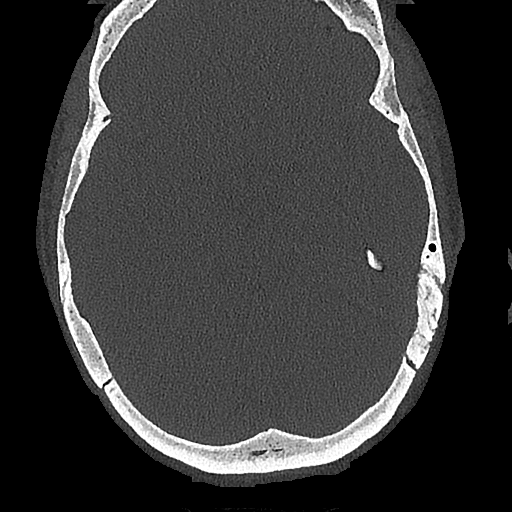
[im 122/135  bone]
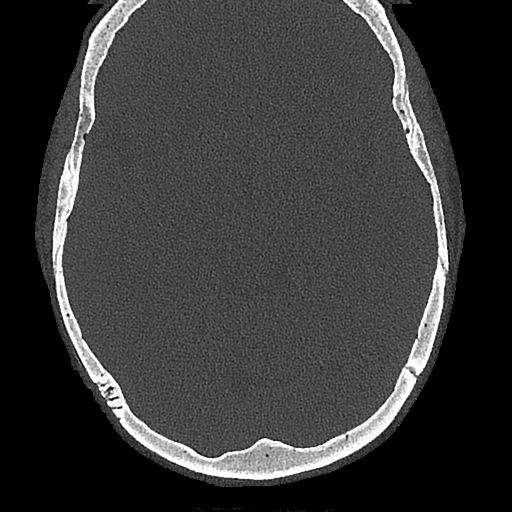

[Series 3: ax soft temperal bones · axial · 0.33mm/px · z∈[-657,-631]mm · 2 of 40 slices shown]
[im 14/40  brain]
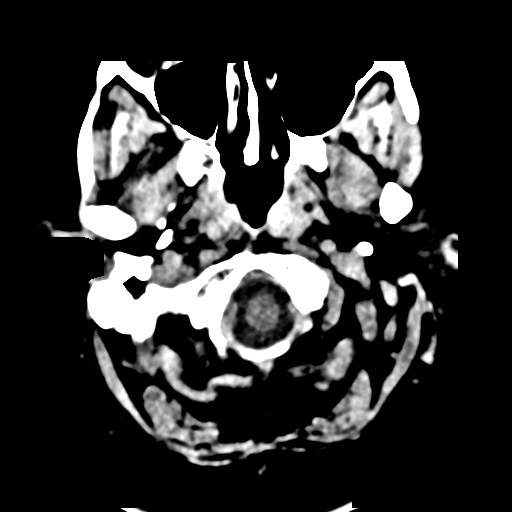
[im 27/40  brain]
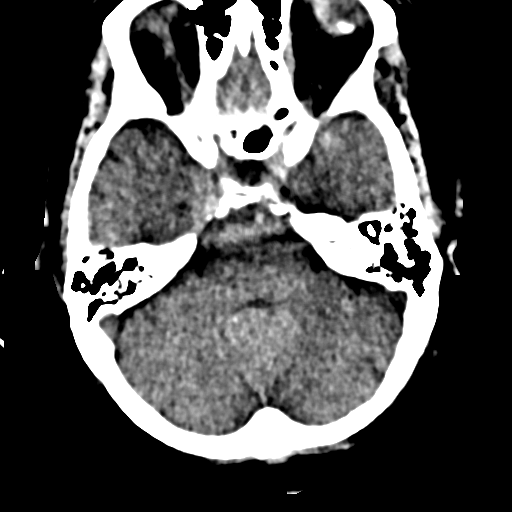

[Series 10: cor mag right temperal bones · coronal · 0.20mm/px · 1 of 125 slices shown]
[im 63/125  bone]
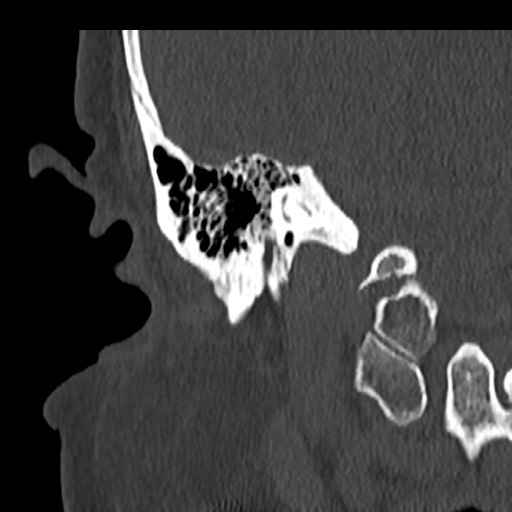

[13 of 40 positions shown; findings below may reference images not displayed]

FINDINGS: Limited intracranial imaging negative.

Mild mucosal edema at the ostium of the maxillary sinus bilaterally.
Mild mucosal edema left frontal sinus. No air-fluid levels.

Right temporal bone: Mastoid sinus well developed and clear without
mucosal edema. Middle ear clear. Ossicles are normal. Tympanic
membrane mildly thickened. Internal and external auditory canal
normal. Negative for cholesteatoma. Negative for mass lesion.
Cochlea and semi circular canals normal. Jugular bulb normal.
Carotid canal normal in location.

Left temporal bone: Mastoid sinus well developed and clear without
mucosal edema. Middle ear clear. Ossicles are normal. Tympanic
membrane normal. Internal and external auditory canal normal.
Negative for cholesteatoma. Negative for mass lesion. Cochlea and
semi circular canals normal. Jugular bulb normal. Carotid canal
normal in location.
IMPRESSION: Right tympanic membrane mildly thickened which may be due to
scarring. Negative for cholesteatoma. No middle ear or mastoid
effusion bilaterally.

## 2019-11-18 MED ORDER — BASAGLAR 100 UNIT/ML (3 ML) SUBCUTANEOUS PEN
Freq: Every evening | SUBCUTANEOUS | 3 refills | 107.00000 days
Start: 2019-11-18 — End: ?

## 2019-11-20 NOTE — Unmapped (Signed)
Please have patient schedule for next follow up visit and meds refill with PCP

## 2019-11-21 NOTE — Unmapped (Addendum)
PRIOR AUTH:    flash glucose sensor (FREESTYLE LIBRE 14 DAY SENSOR) kit         [facility] [place], [place] - [address]   [address], [place] [address]   Phone:  [phone] ??Fax:  [phone]    Dexcom is preferred by patient's insurance.

## 2019-11-21 NOTE — Unmapped (Signed)
Prior authorization initiated and sent to plan on 11/21/2019 with/ from [facility] via CoverMyMeds website.    Medication: Freestyle Libre     Reference ID: Key: [id number]     Status: approved: 11/21/2019; coverage dates: effective 11/21/2019 through 11/20/2020.    Pharmacy Notified: no.    Patient Notified: no.    Provider Notified: no.    Clinical Information: Approved. N.Raines RMA.

## 2019-11-21 NOTE — Unmapped (Signed)
Prior authorization initiated and sent to plan on 11/21/2019 with/ from [facility] via CoverMyMeds website.    Medication: Basaglar    Reference ID: Key: [id number]     Status: waiting for plan determination; determination expected within 72 hours via CoverMyMeds website.    Pharmacy Notified: no.    Patient Notified: no.    Provider Notified: no.    Clinical Information: Awaiting Determination. N.Raines RMA.

## 2019-11-25 DIAGNOSIS — R809 Proteinuria, unspecified: Principal | ICD-10-CM

## 2019-11-25 DIAGNOSIS — E1129 Type 2 diabetes mellitus with other diabetic kidney complication: Principal | ICD-10-CM

## 2019-11-25 DIAGNOSIS — Z794 Long term (current) use of insulin: Principal | ICD-10-CM

## 2019-11-25 MED ORDER — INSULIN DETEMIR (U-100) 100 UNIT/ML (3 ML) SUBCUTANEOUS PEN
Freq: Every evening | SUBCUTANEOUS | 2 refills | 30 days | Status: CP
Start: 2019-11-25 — End: 2020-02-23

## 2019-11-25 MED FILL — COSENTYX PEN 300 MG/2 PENS (150 MG/ML) SUBCUTANEOUS: 28 days supply | Qty: 2 | Fill #5 | Status: AC

## 2019-11-25 MED FILL — COSENTYX PEN 300 MG/2 PENS (150 MG/ML) SUBCUTANEOUS: SUBCUTANEOUS | 28 days supply | Qty: 2 | Fill #5

## 2019-11-25 NOTE — Unmapped (Signed)
[facility] Refill Coordination Note    Specialty Medication(s) to be Shipped:   Inflammatory Disorders: Cosentyx    Other medication(s) to be shipped: No additional medications requested for fill at this time     Julia Avila, DOB: [birth year]  Phone: [phone] (home)       All above HIPAA information was verified with patient.     Was a Nurse, learning disability used for this call? No    Completed refill call assessment today to schedule patient's medication shipment from the [facility] [phone]).       Specialty medication(s) and dose(s) confirmed: Regimen is correct and unchanged.   Changes to medications: Julia Avila reports no changes at this time.  Changes to insurance: No  Questions for the pharmacist: No    Confirmed patient received Welcome Packet with first shipment. The patient will receive a drug information handout for each medication shipped and additional FDA Medication Guides as required.       DISEASE/MEDICATION-SPECIFIC INFORMATION        For patients on injectable medications: Patient currently has 0 doses left.  Next injection is scheduled for 8/11.    SPECIALTY MEDICATION ADHERENCE     Medication Adherence    Patient reported X missed doses in the last month: 0  Specialty Medication: Cosentyx  Patient is on additional specialty medications: No  Patient is on more than two specialty medications: No  Any gaps in refill history greater than 2 weeks in the last 3 months: no  Demonstrates understanding of importance of adherence: yes  Informant: patient                Cosentyx 150mg /ml: Patient has 0 days of medication on hand       SHIPPING     Shipping address confirmed in Epic.     Delivery Scheduled: Yes, Expected medication delivery date: 8/10.     Medication will be delivered via UPS to the prescription address in Epic WAM.    Olga Millers   [facility] Specialty Technician

## 2019-11-25 NOTE — Unmapped (Addendum)
PA Denied for [employer]. Per patient's plan, she needs to try and fail 2 alternatives. Since she has tried and failed Lantus the other alternatives are: Toujeo, Levemir, and [place].

## 2019-11-26 NOTE — Unmapped (Signed)
In basket message:    Can you help me schedule Julia Avila for a return in person PCP appt on August 27th after 2:30pm.

## 2019-11-29 MED ORDER — FREESTYLE LIBRE 14 DAY SENSOR KIT
PACK | 11 refills | 0.00000 days | Status: CP
Start: 2019-11-29 — End: ?

## 2019-11-29 NOTE — Unmapped (Signed)
Med refill request.  Pt states pharmacy did not receive a script for the freestyle [employer].  [facility] [place]

## 2019-11-29 NOTE — Unmapped (Signed)
2nd attempt to contac pt    In basket message:  ??  Can you help me schedule Julia Avila for a return in person PCP appt on August 27th after 2:30pm

## 2019-12-13 ENCOUNTER — Ambulatory Visit: Admit: 2019-12-13 | Discharge: 2019-12-14 | Payer: PRIVATE HEALTH INSURANCE

## 2019-12-13 DIAGNOSIS — R809 Proteinuria, unspecified: Principal | ICD-10-CM

## 2019-12-13 DIAGNOSIS — D649 Anemia, unspecified: Principal | ICD-10-CM

## 2019-12-13 DIAGNOSIS — I10 Essential (primary) hypertension: Principal | ICD-10-CM

## 2019-12-13 DIAGNOSIS — E039 Hypothyroidism, unspecified: Principal | ICD-10-CM

## 2019-12-13 DIAGNOSIS — L4050 Arthropathic psoriasis, unspecified: Principal | ICD-10-CM

## 2019-12-13 DIAGNOSIS — E1129 Type 2 diabetes mellitus with other diabetic kidney complication: Principal | ICD-10-CM

## 2019-12-13 DIAGNOSIS — Z794 Long term (current) use of insulin: Secondary | ICD-10-CM

## 2019-12-13 DIAGNOSIS — F329 Major depressive disorder, single episode, unspecified: Principal | ICD-10-CM

## 2019-12-13 DIAGNOSIS — E1159 Type 2 diabetes mellitus with other circulatory complications: Secondary | ICD-10-CM

## 2019-12-13 LAB — TSH: THYROID STIMULATING HORMONE: 4.678 u[IU]/mL (ref 0.550–4.780)

## 2019-12-13 MED ORDER — INSULIN DETEMIR (U-100) 100 UNIT/ML (3 ML) SUBCUTANEOUS PEN
Freq: Every evening | SUBCUTANEOUS | 2 refills | 30.00000 days
Start: 2019-12-13 — End: 2020-03-12

## 2019-12-13 MED ORDER — OZEMPIC 0.25 MG OR 0.5 MG (2 MG/1.5 ML) SUBCUTANEOUS PEN INJECTOR
SUBCUTANEOUS | 3 refills | 112.00000 days | Status: CP
Start: 2019-12-13 — End: 2020-12-12

## 2019-12-13 NOTE — Unmapped (Signed)
Internal Medicine Clinic Visit    Reason for visit: Routine follow-up    A/P:    T2DM: Medications co-managed with Forde Radon. A1C on 10/03/2019 was 10.3%. Is using 48 units Insulin Detemir and Semaglutide 0.25 mg weekly for the last 7 weeks.   -Will recheck A1C at next follow-up  -Continue Insulin Detemir 48 units nightly  -Increase semaglutide to 0.5 mg weekly   -Freestyle libre prior auth approved, she will pick this up from the pharmacy.     HTN, microalbuminuria: 133/87 today.  -Lisinopril 5 mg daily  -CTM    Hypothyroidism: TSH was 4.69 in June 2021 at which patient was not routinely taking her Levothyroxine. Forde Radon recommended stopping Levothyroxine and re-checking TSH in 6 weeks.   -Recheck TSH today    Psoriatic Arthritis: Some continued back pain, but feeling well otherwise without acute joint complaints and skin disease under great control with Cosentyx.  -Cosentyx 300mg  SubQ monthly  -Covid booster- completed on 12/04/2019  -Continue topical agents as needed.    Depression: symptoms under well control.   - Continue bupropion XL 150mg      HLD:   -Atorvastatin 40mg  daily    HCM:   Tdap- administered today.  Shingrix- provided information in AVS.  Pap Smear- deferred for today, would like to do at next visit.     Julia was seen today for follow-up.    Diagnoses and all orders for this visit:    Type 2 diabetes mellitus with microalbuminuria, with long-term current use of insulin (CMS-HCC)  -     insulin detemir U-100 (LEVEMIR) 100 unit/mL (3 mL) injection pen; Inject 0.48 mL (48 Units total) under the skin nightly.    Psoriatic arthritis (CMS-HCC)    Anemia, unspecified type    Depression, unspecified depression type    Hypothyroidism, unspecified type  -     TSH; Future  -     TSH    Hypertension associated with diabetes (CMS-HCC)    Other orders  -     semaglutide (OZEMPIC) 0.25 mg or 0.5 mg(2 mg/1.5 mL) PnIj injection; Inject 0.5 mg under the skin every seven (7) days.  -     Tdap vaccine greater than or equal to 7yo IM    _________________________________________________________    HPI:  Julia Avila is a 54 yo woman with a history of T2DM, HTN, PsA arthritis, Depression who presents for routine follow-up.     She reports she has been doing well. She has been getting her medications in pill packs which has helped immensely with her remembering to take them. She reports her blood sugars have mostly been in the 200's but she did have a reading recently that was 124 which is the lowest she has seen.     Has been working on her diet and exercising some more. Trying to take better care of herself.     Had episodes of thrush recently but this has improved with Magic mouthwash and is not bothering her today.     ROS otherwise negative.   _________________________________________________________  Problem List:  Patient Active Problem List   Diagnosis   ??? Anemia   ??? Type 2 diabetes mellitus with microalbuminuria, with long-term current use of insulin (CMS-HCC)   ??? Depression   ??? Psoriatic arthritis (CMS-HCC)   ??? Bilateral hearing loss   ??? Hypertension associated with diabetes (CMS-HCC)       Medications:  Reviewed in EPIC  __________________________________________________________    Physical Exam:  Vital Signs:  Vitals:    12/13/19 1513   BP: 133/87   Pulse: 78   Temp: 36.3 ??C   TempSrc: Temporal   SpO2: 98%   Weight: 82.3 kg (181 lb 6.4 oz)   Height: 157.5 cm (5' 2)       Gen: Well appearing, NAD  Mouth: Clear oropharynx, no erythema or exudate, no evidence of thrush.   CV: RRR, no murmurs  Pulm: CTA bilaterally, no crackles or wheezes  Abd: Soft, NTND, normal BS. No HSM.  Ext: No edema  Skin: No rashes on exposed skin.

## 2019-12-13 NOTE — Unmapped (Addendum)
It was nice to meet you today!    (1) We increased your Ozempic to 0.5 mg once a week. Start this today.   (2) We will give you the tetanus vaccine today.    (3) Try and get the Shingrix Vaccine (protects you from Shingles) at your local pharmacy         [facility]  [address]  [place], [address]  Phone: [phone]  Toll Free: [phone]  Fax: [phone]    Thank you for choosing [facility] for your care.    Important Numbers    Main Clinic: [phone] or toll free [phone]    After Hours, Weekend, or Holidays:  ?? Call the [facility] 24/7 Nursing Line [phone] or toll free [phone] to get nurse advice.  ?? Go to [facility] Urgent [facility] walk-in clinic at [address], [place], [place]; [phone]; 7 days a week from 9:00AM - 8:00PM.  ?? Go to [facility] at [facility] at [address] [place], [place]; [phone]; Mon-Fri 7:00AM-9:00PM, Sat-Sun 12:00PM-5:00PM  ?? Go to [facility] for sprains and strains, joint pain, sports injuries and possible fractures at [address], [place], [place]; [phone]; Mon-Thurs 8:00AM-7:00PM, Fri 8:00AM-5:00PM      Care Manager  I'm having trouble with my health because of cost, my mood, trouble getting to clinic, or where I live. How can I get help? Your Care Manager can help!  ??? Call Jennell Corner, LCSW(Bilingual) at [phone] or Keyli Duross, LCSW-A at [phone] for assistance.  ??? They are available Monday-Friday 8:00AM -5:00PM      [place] [facility] at [facility] Counselor: Ivor Costa [phone]  [facility] at [facility] Counselor (Bilingual): Doyce Para [phone]    [facility] Pharmacy Assistance([place] PAP): [phone]  [facility]: [phone] *Pharmacy can mail medications to your home. You must call to request the medication be mailed.[facility]: [phone]  [facility]: [phone]    [facility]  I'm feeling unsafe, have experienced physical abuse, threats, emotional abuse, sexual abuse or other violence. Who do I call for confidential advice and assistance?  ??? Call [phone] Monday through Fridays 9:00am-4:30pm. Call [phone] after hours.    Same Day Clinic   I'm sick today and need an appointment during office hours. Who do I call?  ?? Call [phone] or toll free [phone], ask for an appointment in the Same Day Clinic  ?? Same Day Clinic is located on the 5th floor at [address] [place] [address]    How do I request medication refills?  Request a refill via [facility] (patient portal), call clinic at [phone] or have your pharmacy fax the request to [phone].      We highly encourage those with internet access to sign up for My [facility] Chart, our new patient portal service.  This service is free to all [facility] patients and offers the following benefits:  ?? Secure messaging with your care team  ?? Request appointments/cancel appointments  ?? Access test results  ?? Request prescription refills  ?? Pay bills online  ?? Manage the health of loved ones  ?? Track your health    Sign up for your My [facility] Chart account at [email or web address].  Free Android and iOs smartphone and tablet applications are  also available for your convenience.    If you forget your [facility] Chart username or password, select ???forgot username??? or ???forgot password??? located under the ???sign in??? button on the login page. If you do not remember the information required to reset your password, you can contact [facility] at [phone] to have your My [facility] Chart password reset.    The [facility] MyChart secure website and app makes it easy for you to schedule appointments. You can schedule most primary care clinic appointments online with providers you have seen before. Log in to [email or web address] and select Visits to schedule an appointment today.    ======================================================================================  Please go to your pharmacy for the Shingrix vaccine. Once you receive the vaccine, ask your pharmacy to send the information to [place] so we can update your record  [facility] Fax: [phone].    Common Questions:  What is shingles?  Shingles is a painful, blistering rash caused by the same virus that causes chicken pox. Shingles can cause permanent nerve pain and damage. Shingles occurs when hidden chicken pox virus in your body comes back. You are more likely to get shingles as you get older. Shingles cannot be passed from one person to another. However, someone with shingles can spread chicken pox.   Why should you get [facility]?  SHINGRIX is very effective at preventing shingles and the nerve pain associated with the shingles.   Who should get [facility]?  Everyone 50 and older should receive SHINGRIX, even if you have already had shingles or received ZOSTAVAX before. [facility] is not recommended for immunocompromised patients or those with a history of severe hypersensitivity to [facility] in the past.  When to get [facility]?  SHINGRIX is two shots given 2-6 months apart. You should get it if you are over 50, even if you have already had ZOSTAVAX (another shingles shot).  Where can I get [facility]?  Many pharmacies have [facility] and can give the shot including [facility], [employer], [address], [employer], [employer], [place], [place] and many others.   How much does SHINGRIX cost?  SHINGRIX may be covered by your insurance. Call your pharmacy to find out if [facility] is covered under your insurance and how much you will have to pay.  What are the side effects of SHINGRIX?  The most common side effects to [facility] are pain, redness or swelling at the injection site. Some patients also report fevers, chills or tiredness. Side effects last for 1-2 days, plan to receive the vaccine at a time that is convenient for you.    Want to know more about shingles or SHINGRIX?  Ask your pharmacist if you have more questions. If you want to know more, this website also has good information : [email or web address]

## 2019-12-13 NOTE — Unmapped (Signed)
[place] [facility] at [facility]       Type of visit:  face to face    Reason for visit: follow up    Questions / Concerns that need to be addressed: none voiced    General Consent to Treat (GCT) for non-epic video visits only: Verbal consent    Screening BP- 133/87    Allergies reviewed: Yes    Medication reviewed: Yes  Pended refills? No    HCDM reviewed and updated in Epic:    We are working to make sure all of our patients??? wishes are updated in Epic and part of that is documenting a Environmental health practitioner for each patient  A Health Care Decision Rodena Piety is someone you choose who can make health care decisions for you if you are not able ??? who would you most want to do this for you????  is already up to date.    BPAs completed:  HARK - Interpersonal Violence  Falls Risk - adults 65+    COVID-19 Vaccine Summary  Type:  Complete  Dates Given:  07/12/2019    Immunization History   Administered Date(s) Administered   ??? COVID-19 VACC,MRNA,(PFIZER)(PF)(IM) 06/21/2019, 07/12/2019   ??? Influenza Vaccine Quad (IIV4 PF) 49mo+ injectable 01/12/2015, 01/13/2016, 01/09/2017   ??? Influenza Virus Vaccine, unspecified formulation 01/30/2018, 02/20/2019   ??? PNEUMOCOCCAL POLYSACCHARIDE 23 09/09/2014   ??? Pneumococcal Conjugate 13-Valent 05/31/2019   __________________________________________________________________________________________    SCREENINGS COMPLETED IN FLOWSHEETS    HARK Screening  HARK Screening  Within the last year, have you been humiliated or emotionally abused in other ways by your partner or ex-partner?: No  Within the last year, have you been afraid of your partner or ex-partner?: No  Within the last year, have you been raped or forced to have any kind of sexual activity by your partner or ex-partner?: No  Within the last year, have you been kicked, hit, slapped, or otherwise physically hurt by your partner or ex-partner?: No    Falls Risk  Falls Risk  Have you fallen in the past year?: (!) Yes  Do you feel unsteady when standing or walking?: No

## 2019-12-16 ENCOUNTER — Ambulatory Visit: Admit: 2019-12-16 | Payer: PRIVATE HEALTH INSURANCE | Attending: Registered" | Primary: Registered"

## 2019-12-16 MED ORDER — FREESTYLE LIBRE 14 DAY READER
Freq: Once | 0 refills | 0 days | Status: CP
Start: 2019-12-16 — End: 2019-12-16

## 2019-12-16 NOTE — Unmapped (Signed)
Sent eRx for Freestyle reader to go with cgm sensors per pharmacy request.

## 2019-12-17 NOTE — Unmapped (Signed)
I saw and evaluated the patient, participating in the key portions of the service.  I reviewed the resident’s note.  I agree with the resident’s findings and plan. Sacha Radloff E Kimel-Scott, MD

## 2019-12-18 NOTE — Unmapped (Signed)
[facility] Clinical Assessment & Refill Coordination Note    Julia Avila, DOB: [birth year]  Phone: [phone] (home)     All above HIPAA information was verified with patient.     Was a Nurse, learning disability used for this call? No    Specialty Medication(s):   Inflammatory Disorders: Cosentyx     Current Outpatient Medications   Medication Sig Dispense Refill   ??? atorvastatin (LIPITOR) 40 MG tablet Take 1 tablet (40 mg total) by mouth daily. 90 tablet 3   ??? blood sugar diagnostic (GLUCOSE BLOOD) Strp Test blood sugars three times daily. ICD-10 E11.9 100 strip 11   ??? blood-glucose meter kit Use as instructed 1 each 0   ??? blood-glucose meter Misc Use to check blood sugars three times daily. ICD10 E11.9 1 each 0   ??? buPROPion (WELLBUTRIN XL) 150 MG 24 hr tablet Take 1 tablet (150 mg total) by mouth every morning. 90 tablet 3   ??? clobetasoL (TEMOVATE) 0.05 % external solution Apply topically Two (2) times a day. 50 mL 0   ??? flash glucose sensor (FREESTYLE LIBRE 14 DAY SENSOR) kit by Other route every fourteen (14) days. Use to check blood sugar per instructions. Dispense 2 sensors.  E11.9 1 kit 11   ??? gabapentin (NEURONTIN) 300 MG capsule Take 1 tablet by mouth twice a day as needed for itching 60 capsule 3   ??? ibuprofen (ADVIL,MOTRIN) 200 MG tablet Take 200 mg by mouth every six (6) hours as needed for pain.     ??? inhalational spacing device Spcr Use with inhaler to maximize medication delivery to lungs 1 each 0   ??? insulin detemir U-100 (LEVEMIR) 100 unit/mL (3 mL) injection pen Inject 0.48 mL (48 Units total) under the skin nightly. 14.4 mL 2   ??? lancets Misc 1 each by Other route once daily. Test daily before breakfast. Dispense 90 day supply. 100 each 3   ??? lisinopriL (PRINIVIL,ZESTRIL) 5 MG tablet Take 1 tablet (5 mg total) by mouth daily. 90 tablet 3   ??? Magic Mouthwash (nystatin/diphenhydramine/mylanta) Oral Mixture Take 5 mL by mouth every six (6) hours as needed. Swish, gargle, spit as needed.  May be swallowed if esophageal involvement. 120 mL 0   ??? mometasone (ELOCON) 0.1 % lotion Apply with cotton ball to ear canals t bilaterally once every 2weeks 60 mL 0   ??? mupirocin (BACTROBAN) 2 % ointment Apply topically to open areas on arms twice daily as needed. 30 g 4   ??? pen needle, diabetic (BD ULTRA-FINE NANO PEN NEEDLE) 32 gauge x 5/32 (4 mm) Ndle Needles for insulin and Ozempic pen. Inject daily as instructed. ICD-10 E11.9 100 each 3   ??? secukinumab (COSENTYX) 150 mg/mL PnIj injection Inject the contents of 2 pens (300mg  total) under the skin every 28 days 6 mL 3   ??? semaglutide (OZEMPIC) 0.25 mg or 0.5 mg(2 mg/1.5 mL) PnIj injection Inject 0.5 mg under the skin every seven (7) days. 6 mL 3   ??? triamcinolone (KENALOG) 0.1 % ointment Apply topically Two (2) times a day. 80 g 2     No current facility-administered medications for this visit.        Changes to medications: Padme reports no changes at this time.    Allergies   Allergen Reactions   ??? Nitrofurantoin Macrocrystal Rash     Large red welts all over her body   ??? Metformin Diarrhea  Changes to allergies: No    SPECIALTY MEDICATION ADHERENCE     Cosentyx 150mg /ml: 0 days of medicine on hand     Medication Adherence    Patient reported X missed doses in the last month: 0  Specialty Medication: Coxentyx 150mg /ml          Specialty medication(s) dose(s) confirmed: Regimen is correct and unchanged.     Are there any concerns with adherence? No    Adherence counseling provided? Not needed    CLINICAL MANAGEMENT AND INTERVENTION      Clinical Benefit Assessment:    Do you feel the medicine is effective or helping your condition? Yes    Clinical Benefit counseling provided? Not needed    Adverse Effects Assessment:    Are you experiencing any side effects? No    Are you experiencing difficulty administering your medicine? No    Quality of Life Assessment:    Rheumatology:   Quality of Life    On a scale of 1 ??? 10 with 1 representing not at all and 10 representing completely ??? how has your rheumatologic condition affected your:  Daily pain level?: 2  Ability to complete your regular daily tasks (prepare meals, get dressed, etc.)?: 1  Ability to participate in social or family activities?: 1         Have you discussed this with your provider? Not needed    Therapy Appropriateness:    Is therapy appropriate? Yes, therapy is appropriate and should be continued    DISEASE/MEDICATION-SPECIFIC INFORMATION      For patients on injectable medications: Patient currently has 0 doses left.  Next injection is scheduled for 01/01/2020.    PATIENT SPECIFIC NEEDS     - Does the patient have any physical, cognitive, or cultural barriers? No    - Is the patient high risk? No    - Does the patient require a Care Management Plan? No     - Does the patient require physician intervention or other additional services (i.e. nutrition, smoking cessation, social work)? No      SHIPPING     Specialty Medication(s) to be Shipped:   Inflammatory Disorders: Cosentyx 150mg /ml    Other medication(s) to be shipped: No additional medications requested for fill at this time     Changes to insurance: No    Delivery Scheduled: Yes, Expected medication delivery date: 12/27/2019.     Medication will be delivered via UPS to the confirmed prescription address in [facility].    The patient will receive a drug information handout for each medication shipped and additional FDA Medication Guides as required.  Verified that patient has previously received a Conservation officer, historic buildings.    All of the patient's questions and concerns have been addressed.    Karene Fry Rayshaun Needle   [facility] [employer] [facility] Specialty Pharmacist

## 2019-12-26 MED ORDER — FREESTYLE LIBRE 14 DAY READER
Freq: Once | 0 refills | 0.00000 days | Status: CP
Start: 2019-12-26 — End: 2019-12-26

## 2019-12-26 MED ORDER — FREESTYLE LIBRE 14 DAY SENSOR KIT
PACK | 3 refills | 0.00000 days | Status: CP
Start: 2019-12-26 — End: ?

## 2019-12-26 MED FILL — COSENTYX PEN 300 MG/2 PENS (150 MG/ML) SUBCUTANEOUS: 28 days supply | Qty: 2 | Fill #6 | Status: AC

## 2019-12-26 MED FILL — COSENTYX PEN 300 MG/2 PENS (150 MG/ML) SUBCUTANEOUS: SUBCUTANEOUS | 28 days supply | Qty: 2 | Fill #6

## 2019-12-30 DIAGNOSIS — R809 Proteinuria, unspecified: Principal | ICD-10-CM

## 2019-12-30 DIAGNOSIS — E1129 Type 2 diabetes mellitus with other diabetic kidney complication: Principal | ICD-10-CM

## 2019-12-30 DIAGNOSIS — Z794 Long term (current) use of insulin: Principal | ICD-10-CM

## 2019-12-30 MED ORDER — BLOOD-GLUCOSE TRANSMITTER-BLOOD-GLUCOSE SENSOR
3 refills | 0.00000 days | Status: CP
Start: 2019-12-30 — End: 2019-12-30

## 2019-12-30 MED ORDER — BLOOD-GLUCOSE TRANSMITTER-BLOOD-GLUCOSE SENSOR: 1 | each | 3 refills | 0 days | Status: AC

## 2019-12-31 MED ORDER — FREESTYLE LIBRE 14 DAY READER
11 refills | 0 days | Status: CP
Start: 2019-12-31 — End: 2020-12-30

## 2020-01-23 NOTE — Unmapped (Signed)
[facility] Refill Coordination Note    Specialty Medication(s) to be Shipped:   Inflammatory Disorders: Cosentyx    Other medication(s) to be shipped: No additional medications requested for fill at this time     Julia Avila, DOB: Apr 17, 1966  Phone: [phone] (home)       All above HIPAA information was verified with patient.     Was a Nurse, learning disability used for this call? No    Completed refill call assessment today to schedule patient's medication shipment from the [facility] [phone]).       Specialty medication(s) and dose(s) confirmed: Regimen is correct and unchanged.   Changes to medications: Julia Avila reports no changes at this time.  Changes to insurance: No  Questions for the pharmacist: No    Confirmed patient received Welcome Packet with first shipment. The patient will receive a drug information handout for each medication shipped and additional FDA Medication Guides as required.       DISEASE/MEDICATION-SPECIFIC INFORMATION        For patients on injectable medications: Patient currently has 0 doses left.  Next injection is scheduled for 02/03/2020.    SPECIALTY MEDICATION ADHERENCE     Medication Adherence    Patient reported X missed doses in the last month: 0  Specialty Medication: Cosentyx  Patient is on additional specialty medications: No          SHIPPING     Shipping address confirmed in Epic.     Delivery Scheduled: Yes, Expected medication delivery date: 01/30/2020.     Medication will be delivered via UPS to the prescription address in Epic [facility].    Lorelei Pont [facility] Specialty Technician

## 2020-01-24 ENCOUNTER — Ambulatory Visit: Admit: 2020-01-24 | Discharge: 2020-01-25 | Payer: PRIVATE HEALTH INSURANCE

## 2020-01-24 DIAGNOSIS — I152 Hypertension secondary to endocrine disorders: Principal | ICD-10-CM

## 2020-01-24 DIAGNOSIS — I10 Essential (primary) hypertension: Principal | ICD-10-CM

## 2020-01-24 DIAGNOSIS — Z794 Long term (current) use of insulin: Principal | ICD-10-CM

## 2020-01-24 DIAGNOSIS — Z124 Encounter for screening for malignant neoplasm of cervix: Principal | ICD-10-CM

## 2020-01-24 DIAGNOSIS — E782 Mixed hyperlipidemia: Principal | ICD-10-CM

## 2020-01-24 DIAGNOSIS — E1159 Type 2 diabetes mellitus with other circulatory complications: Principal | ICD-10-CM

## 2020-01-24 DIAGNOSIS — R809 Proteinuria, unspecified: Principal | ICD-10-CM

## 2020-01-24 DIAGNOSIS — E1129 Type 2 diabetes mellitus with other diabetic kidney complication: Secondary | ICD-10-CM

## 2020-01-24 LAB — ALBUMIN / CREATININE URINE RATIO: CREATININE, URINE: 36.3 mg/dL

## 2020-01-24 LAB — ALBUMIN QUANT URINE: Albumin:MCnc:Pt:Urine:Qn:: <0.3

## 2020-01-24 MED ORDER — ATORVASTATIN 80 MG TABLET
ORAL_TABLET | Freq: Every day | ORAL | 3 refills | 90.00000 days | Status: CP
Start: 2020-01-24 — End: 2021-01-23

## 2020-01-24 MED ORDER — LISINOPRIL 10 MG TABLET
ORAL_TABLET | Freq: Every day | ORAL | 3 refills | 90 days | Status: CP
Start: 2020-01-24 — End: 2021-01-23

## 2020-01-24 MED ORDER — OZEMPIC 0.25 MG OR 0.5 MG (2 MG/1.5 ML) SUBCUTANEOUS PEN INJECTOR
SUBCUTANEOUS | 3 refills | 112 days | Status: CP
Start: 2020-01-24 — End: ?

## 2020-01-24 NOTE — Unmapped (Signed)
[place] [facility] at [facility]       Type of visit:  face to face    Reason for visit: F/U    Questions / Concerns that need to be addressed:     General Consent to Treat (GCT) for non-epic video vits only:       Diabetes:  ??? Regularly checking blood sugars?: yes  o If yes, when? Complete log for past 7 days  Date Before Breakfast After Breakfast Before Lunch After Lunch Before Dinner After Dinner Before Bed                                                                                                                                     Hypertension:  ??? Have blood pressure cuff at home?: yes- arm cuff  ??? Regularly checking blood pressure?: yes SOMETIMES  If yes, complete log for past 7 days  Date Time BP Pulse                                                                             Screening BP- 128/90,P-87        [facility]ron BPs (complete if screening BP has a systolic  > 139 or diastolic > 89)  BP#1    BP#2   BP#3     Average BP   (please note this as a comment in vitals)         Allergies reviewed: Yes    Medication reviewed: Yes  Pended refills? No        HCDM reviewed and updated in Epic:    We are working to make sure all of our patients??? wishes are updated in Epic and part of that is documenting a Environmental health practitioner for each patient  A Health Care Decision Rodena Piety is someone you choose who can make health care decisions for you if you are not able ??? who would you most want to do this for you????  is already up to date.        BPAs completed:  Falls Risk - adults 65+  Diabetes - POC A1c  Influenza vaccination  Diabetes - urine albumin/creatine ratio      COVID-19 Vaccine Summary  Type:  Complete-Pfizer  Dates Given:  07/12/2019                   If no: Are you interested in scheduling?   Immunization History   Administered Date(s) Administered   ??? COVID-19 VACC,MRNA,(PFIZER)(PF)(IM) 06/21/2019, 07/12/2019   ??? Influenza Vaccine Quad (IIV4 PF) 12mo+ injectable 01/12/2015, 01/13/2016, 01/09/2017   ??? Influenza Virus Vaccine, unspecified  formulation 01/30/2018, 02/20/2019   ??? PNEUMOCOCCAL POLYSACCHARIDE 23 09/09/2014   ??? Pneumococcal Conjugate 13-Valent 05/31/2019   ??? TdaP 12/13/2019       __________________________________________________________________________________________    SCREENINGS COMPLETED IN FLOWSHEETS    HARK Screening       AUDIT       PHQ2       PHQ9          P4 Suicidality Screener                GAD7       COPD Assessment       Falls Risk No to fall, no to unsteady

## 2020-01-24 NOTE — Unmapped (Addendum)
Patient Education     As always it was wonderful to see you today!    We will make three changes to your medications:    (1) Increase your Lisinopril from 5mg  to 10mg  daily (blood pressure medication)  (2) Increase your Semaglutide (Ozempic) to 1mg  weekly  (3) Increase your Atorvastatin (Lipitor) to 80mg  daily    ===========================================     Influenza (Flu) Vaccine (Inactivated or Recombinant): What You Need to Know  Why get vaccinated?  Influenza vaccine can prevent influenza (flu).  Flu is a contagious disease that spreads around the [place] every year, usually between October and May. Anyone can get the flu, but it is more dangerous for some people. Infants and young children, people 24 years of age and older, pregnant women, and people with certain health conditions or a weakened immune system are at greatest risk of flu complications.  Pneumonia, bronchitis, sinus infections and ear infections are examples of flu-related complications. If you have a medical condition, such as heart disease, cancer or diabetes, flu can make it worse.  Flu can cause fever and chills, sore throat, muscle aches, fatigue, cough, headache, and runny or stuffy nose. Some people may have vomiting and diarrhea, though this is more common in children than adults.  Each year, thousands of people in the [place] die from flu, and many more are hospitalized. Flu vaccine prevents millions of illnesses and flu-related visits to the doctor each year.  Influenza vaccine  CDC recommends everyone 85 months of age and older get vaccinated every flu season. Children 6 months through 78 years of age may need 2 doses during a single flu season. Everyone else needs only 1 dose each flu season.  It takes about 2 weeks for protection to develop after vaccination.  There are many flu viruses, and they are always changing. Each year a new flu vaccine is made to protect against three or four viruses that are likely to cause disease in the upcoming flu season. Even when the vaccine doesn't exactly match these viruses, it may still provide some protection.  Influenza vaccine does not cause flu.  Influenza vaccine may be given at the same time as other vaccines.  Talk with your health care provider  Tell your vaccine provider if the person getting the vaccine:  ?? Has had an allergic reaction after a previous dose of influenza vaccine, or has any severe, life-threatening allergies.  ?? Has ever had Guillain-Barr?? Syndrome (also called GBS).  In some cases, your health care provider may decide to postpone influenza vaccination to a future visit.  People with minor illnesses, such as a cold, may be vaccinated. People who are moderately or severely ill should usually wait until they recover before getting influenza vaccine.  Your health care provider can give you more information.  Risks of a vaccine reaction  ?? Soreness, redness, and swelling where shot is given, fever, muscle aches, and headache can happen after influenza vaccine.  ?? There may be a very small increased risk of Guillain-Barr?? Syndrome (GBS) after inactivated influenza vaccine (the flu shot).  Young children who get the flu shot along with pneumococcal vaccine (PCV13), and/or DTaP vaccine at the same time might be slightly more likely to have a seizure caused by fever. Tell your health care provider if a child who is getting flu vaccine has ever had a seizure.  People sometimes faint after medical procedures, including vaccination. Tell your provider if you feel dizzy or have vision changes  or ringing in the ears.  As with any medicine, there is a very remote chance of a vaccine causing a severe allergic reaction, other serious injury, or death.  What if there is a serious problem?  An allergic reaction could occur after the vaccinated person leaves the clinic. If you see signs of a severe allergic reaction (hives, swelling of the face and throat, difficulty breathing, a fast heartbeat, dizziness, or weakness), call 9-1-1 and get the person to the nearest hospital.  For other signs that concern you, call your health care provider.  Adverse reactions should be reported to the Vaccine Adverse Event Reporting System (VAERS). Your health care provider will usually file this report, or you can do it yourself. Visit the VAERS website at www.vaers.LAgents.no or call 951-637-3388. VAERS is only for reporting reactions, and VAERS staff do not give medical advice.  The National Vaccine Injury Compensation Program  The National Vaccine Injury Compensation Program (VICP) is a federal program that was created to compensate people who may have been injured by certain vaccines. Visit the VICP website at SpiritualWord.at or call (985)475-9338 to learn about the program and about filing a claim. There is a time limit to file a claim for compensation.  How can I learn more?  ?? Ask your healthcare provider.  ?? Call your local or state health department.  ?? Contact the Centers for Disease Control and Prevention (CDC):  ? Call 989-114-8125 (1-800-CDC-INFO) or  ? Visit CDC's website at BiotechRoom.com.cy  Vaccine Information Statement (Interim)  Inactivated Influenza Vaccine  11/30/2017  42 U.[place]C. ?? [phone]  Department of Health and Insurance risk surveyor for Disease Control and Prevention  Many Vaccine Information Statements are available in Spanish and other languages. See PromoAge.com.br.  Muchas hojas de informaci??n sobre vacunas est??n disponibles en espa??ol y en otros idiomas. Visite PromoAge.com.br.  Care instructions adapted under license by [facility]. If you have questions about a medical condition or this instruction, always ask your healthcare professional. [employer] disclaims any warranty or liability for your use of this information.

## 2020-01-24 NOTE — Unmapped (Signed)
Internal Medicine Clinic Visit    Reason for visit: Pap Smear and Diabetes follow up    A/P:    T2DM: Medications co-managed with Forde Radon. A1C on 10/03/2019 was 10.3%. Is using 48 units Insulin Detemir and Semaglutide 0.5 mg weekly. A1C today is 8.7.  -Continue Insulin Detemir 48 units nightly  -Increase semaglutide to 1 mg weekly   -Continue Freestyle libre  -Will check albumin/cr today    HTN, microalbuminuria: 128/90 today.  -Increase Lisinopril to 10mg  daily  -CTM    Hypothyroidism: TSH was 4.69 in June 2021 at which patient was not routinely taking her Levothyroxine. Forde Radon recommended stopping Levothyroxine and re-checking TSH. TSH was 4.678 off therapy.   -CTM for symptoms  -Will not resume Levothyroxine now    Psoriasis - Psoriatic Arthritis: Some continued back pain, but feeling well otherwise without acute joint complaints and skin disease under great control with Cosentyx.  -Cosentyx 300mg  SubQ monthly  -Covid booster- completed on 12/04/2019  -Continue topical agents as needed.    Depression: symptoms under well control.   - Continue bupropion XL 150mg      HLD:   -Increase to Atorvastatin 80mg     HCM:   [x]  Flu Vaccine- given today  [ ]  Shingrix- patient will try to get at local pharmacy  [x]  Pap Smear w/ HPV co-testing- done today    Julia was seen today for follow-up.    Diagnoses and all orders for this visit:    Screening for cervical cancer  -     Pap Smear    Hypertension associated with diabetes (CMS-HCC)    Type 2 diabetes mellitus with microalbuminuria, with long-term current use of insulin (CMS-HCC)  -     Microalbumin / creatinine urine ratio  -     semaglutide (OZEMPIC) 0.25 mg or 0.5 mg(2 mg/1.5 mL) PnIj injection; Inject 1 mg under the skin every seven (7) days.    Essential hypertension  -     lisinopriL (PRINIVIL,ZESTRIL) 10 MG tablet; Take 1 tablet (10 mg total) by mouth daily.    Mixed hyperlipidemia  -     atorvastatin (LIPITOR) 80 MG tablet; Take 1 tablet (80 mg total) by mouth daily.    Other orders  -     POCT Glycosylated Hemoglobin (HGB A1c)  -     INFLUENZA VACCINE (QUAD) IM - 6 MO-ADULT - PF       _________________________________________________________    HPI:  Julia Avila is a 54 yo woman with a history of T2DM, HTN, PsA arthritis, Depression who presents for routine follow-up.     Blood Sugars have ranged from 115 to mid 200's. Has gotten the freestyle libre installed and is using it to track her sugars.     Blood Pressure at home highest she has seen is the 140's. No symptoms of HTN. No orthostasis symptoms.     Gyn health:  LMP- 10 years ago  Sexual hx- not sexually active now. No concern for STDs.     Also has noticed a sore on her scalp, has tried to put anti-bacterial cream on it but not helping much. Painful to the touch.   _________________________________________________________  Problem List:  Patient Active Problem List   Diagnosis   ??? Anemia   ??? Type 2 diabetes mellitus with microalbuminuria, with long-term current use of insulin (CMS-HCC)   ??? Depression   ??? Psoriatic arthritis (CMS-HCC)   ??? Bilateral hearing loss   ??? Hypertension associated with diabetes (CMS-HCC)  Medications:  Reviewed in EPIC  __________________________________________________________    Physical Exam:   Vital Signs:  Vitals:    01/24/20 1526   BP: 128/90   Pulse: 87   Temp: 36.6 ??C   TempSrc: Oral   SpO2: 92%   Weight: 82.6 kg (182 lb)   Height: 157.5 cm (5' 2)       Gen: Well appearing, NAD  CV: Regular rate, non-tachycardic  Pulm: Normal WOB on RA  Abd: Non-distended  Ext: No edema  Skin: No rashes on exposed skin. Small 1cm circular lesion of posterior aspect of scalp with minimal surrounding erythema and central scab, no pus or exudate.   GU: External anatomy within normal limits, cervix well visualized without discharge or redness, samples for pap smear taken.

## 2020-01-25 NOTE — Unmapped (Signed)
I saw and evaluated the patient, participating in the key portions of the service.  I reviewed the resident’s note.  I agree with the resident’s findings and plan. Shaarav Ripple E Kimel-Scott, MD

## 2020-01-29 MED FILL — COSENTYX PEN 300 MG/2 PENS (150 MG/ML) SUBCUTANEOUS: 28 days supply | Qty: 2 | Fill #7 | Status: AC

## 2020-01-29 MED FILL — COSENTYX PEN 300 MG/2 PENS (150 MG/ML) SUBCUTANEOUS: SUBCUTANEOUS | 28 days supply | Qty: 2 | Fill #7

## 2020-01-29 NOTE — Unmapped (Signed)
Per [facility], please order semaglutide 1 mg dose. Otherwise, pt will have to use two syringes for each one time.      semaglutide (OZEMPIC) 0.25 mg or 0.5 mg(2 mg/1.5 mL) PnIj injection       Sig: Inject 1 mg under the skin every seven (7) days.      [facility] [place], [place] - [address]   [address], [place] [address]   Phone:  [phone] ??Fax:  [phone]

## 2020-01-30 DIAGNOSIS — R809 Proteinuria, unspecified: Principal | ICD-10-CM

## 2020-01-30 DIAGNOSIS — E1129 Type 2 diabetes mellitus with other diabetic kidney complication: Principal | ICD-10-CM

## 2020-01-30 DIAGNOSIS — I10 Essential (primary) hypertension: Principal | ICD-10-CM

## 2020-01-30 DIAGNOSIS — Z794 Long term (current) use of insulin: Principal | ICD-10-CM

## 2020-01-30 MED ORDER — OZEMPIC 1 MG/DOSE (4 MG/3 ML) SUBCUTANEOUS PEN INJECTOR
SUBCUTANEOUS | 11 refills | 28.00000 days | Status: CP
Start: 2020-01-30 — End: 2021-01-29

## 2020-01-31 NOTE — Unmapped (Signed)
Encounter entered in error.     Orders only encounter to refill patient's Semaglutide.

## 2020-02-03 NOTE — Unmapped (Signed)
Referring Provider:  Jonetta Speak, MD  [address]  [address]  [place],  [place] [address]     Primary Care Provider:  Coralee Pesa, MD  [address]  [place] [address]    Chief Complaint: urticaria, rash associated with Macrobid, itching    History of Present Illness:  Julia Avila is a 54 y.o. female with history of psoriasis and psoriatic arthritis who is seen in consultation at the request of Dr. Hezzie Bump for evaluation of pruritis and possible drug allergy. She is followed by [facility] and current medication regimen is: cosentyx 300 mg q 28 days monotherapy. Notably has problems with skin itching and is on gabapentin prescribed by her dermatologist which is helping.     She reports that she had an allergic reaction to a medication last year and was in the hospital. She reports that she also had hyperglycemia at the time. She believes that it was Macrobid. She notes that she has psoriasis and psoriatic arthritis and is on immunosuppressive therapy with Cosentyx    Reports that days later after starting the Macrobid she started feeling funny and felt weaker. She reports that she was very itchy which was not uncommon given that she has psoriasis. She then developed fevers and chills, while becoming progressive weaker. She then went to the ED and was admitted for DKA. Per chart review, [name] was hospitalized in November 2020, she presented to the ED for rash after starting Macrobid for UTI one week prior. She reported not taking her insulin for approximately 2 days prior to admission. Blood sugar was >500 with a gap of 20, bicarb 19, Na+ 127. She was placed on insulin drip and received benadryl, pepcid, vistaril, and fluids in the ED. A1C 9.9. Of note, the Macrobid was prescribed on 02/22/19 and she was admitted on 03/03/19.      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 She will notice that when resting her arm on the black rim at the top of her aquarium, she will develop urticaria shortly after.     Seasonal allergy symptoms: itchy eyes, swollen eyes; used Zaditor eye drops with some success. Never tried pataday.       Medications (Reviewed and reconciled):  Current Outpatient Medications   Medication Sig Dispense Refill   ??? atorvastatin (LIPITOR) 80 MG tablet Take 1 tablet (80 mg total) by mouth daily. 90 tablet 3   ??? blood sugar diagnostic (GLUCOSE BLOOD) Strp Test blood sugars three times daily. ICD-10 E11.9 100 strip 11   ??? blood-gluc transmitter-sensor Misc 1 each by Miscellaneous route every fourteen (14) days. 6 each 3   ??? blood-glucose meter kit Use as instructed 1 each 0   ??? blood-glucose meter Misc Use to check blood sugars three times daily. ICD10 E11.9 1 each 0   ??? buPROPion (WELLBUTRIN XL) 150 MG 24 hr tablet Take 1 tablet (150 mg total) by mouth every morning. 90 tablet 3   ??? clobetasoL (TEMOVATE) 0.05 % external solution Apply topically Two (2) times a day. 50 mL 0   ??? flash glucose sensor (FREESTYLE LIBRE 14 DAY SENSOR) kit by Other route every fourteen (14) days. Please dispense sensors for Freestyle Libre 14 day System. Use to check blood sugars continuously. E11.29 Z79.4. 6 kit 3   ??? FREESTYLE LIBRE 14 DAY READER USE AS DIRECTED 1 each 11   ??? gabapentin (NEURONTIN) 300 MG capsule Take 1 tablet by mouth twice a  day as needed for itching 60 capsule 3   ??? ibuprofen (ADVIL,MOTRIN) 200 MG tablet Take 200 mg by mouth every six (6) hours as needed for pain.     ??? inhalational spacing device Spcr Use with inhaler to maximize medication delivery to lungs 1 each 0   ??? insulin detemir U-100 (LEVEMIR) 100 unit/mL (3 mL) injection pen Inject 0.48 mL (48 Units total) under the skin nightly. 14.4 mL 2   ??? lancets Misc 1 each by Other route once daily. Test daily before breakfast. Dispense 90 day supply. 100 each 3   ??? lisinopriL (PRINIVIL,ZESTRIL) 10 MG tablet Take 1 tablet (10 mg total) by mouth daily. 90 tablet 3   ??? Magic Mouthwash (nystatin/diphenhydramine/mylanta) Oral Mixture Take 5 mL by mouth every six (6) hours as needed. Swish, gargle, spit as needed.  May be swallowed if esophageal involvement. 120 mL 0   ??? mometasone (ELOCON) 0.1 % lotion Apply with cotton ball to ear canals t bilaterally once every 2weeks 60 mL 0   ??? mupirocin (BACTROBAN) 2 % ointment Apply topically to open areas on arms twice daily as needed. 30 g 4   ??? pen needle, diabetic (BD ULTRA-FINE NANO PEN NEEDLE) 32 gauge x 5/32 (4 mm) Ndle Needles for insulin and Ozempic pen. Inject daily as instructed. ICD-10 E11.9 100 each 3   ??? secukinumab (COSENTYX) 150 mg/mL PnIj injection Inject the contents of 2 pens (300mg  total) under the skin every 28 days 6 mL 3   ??? semaglutide (OZEMPIC) 1 mg/dose (4 mg/3 mL) PnIj injection Inject 1 mg under the skin every seven (7) days. 3 mL 11   ??? triamcinolone (KENALOG) 0.1 % ointment Apply topically Two (2) times a day. 80 g 2     No current facility-administered medications for this visit.       Allergies:   Drug reactions: Nitrofurantoin macrocrystal and Metformin   Foods: None; no hives or rash associated with foods; no oral allergy syndrome.  ASA/NSAIDS: None known; no angioedema or associated respiratory complaints.  Hymenoptera: No anaphylaxis. OR Never stung. OR Large local only.  Latex: No reactions. OR Mild dermatitis only.    Environmental Review; Symptoms associated with: pollen, trees  -------------------------------------------------------------------  Timing, season: sometimes gets a month flare; Spring, Summer, sometimes Winter    Review of Systems:  (Unless otherwise designated above under HPI,  no additional significant complaints,  referrable to the CC or reasons for which I am evaluating the patient, were elicited in any organ system unless designated below)  -------------------------------  Constitutional:No chills, fevers, wt gain or loss, fatigue.  Endocrine: No signs of sx of thyroid disease or other endocrine problems.  Head/Eyes/Ears/Nose: +itchy eyes. +eye swelling. No other changes in vision, dry eyes, no other problems with rhinitis, sinusitis, otitis.  Mouth/Throat: No pain, ulcers, thrush, dry mouth, difficulty swallowing.  Cardiovascular: No palpitations, pain, orthopnea, PND, pedal edema.  Pulmonary: No significant shortness of breath, sputum production, or cough.  GI: No nausea, vomiting, heartburn, indigestion, blood in stool, or dark, tarry stool, food intolerances.  GU/GYN: No frequency, dyuria, nocturia.  Muscular/skeletal: minor arthritis, no significant inflammatory arthritis.  Heme/ONC/Lymph: No anemia or other known hematological problems.  Skin: +rash after Macrobid; +urticaria after working on aquarium. Minor eczematous changes but no significant bullus, vasculitic, or exfolliative problems.  Neuro: No localizing neurological problems, weakness, parathesias, seizure activity.  Psych:No significant depression, anxiety, bipolar problems, or cognitive problems.    Social History     Tobacco Use   ???  Smoking status: Never Smoker   ??? Smokeless tobacco: Never Used   Substance Use Topics   ??? Alcohol use: No     Alcohol/week: 0.0 standard drinks   ??? Drug use: No     Past Medical History:   Diagnosis Date   ??? Diabetes mellitus (CMS-HCC)    ??? Essential hypertension 01/09/2017   ??? Hypertension    ??? Hypothyroidism    ??? Nephrolithiasis    ??? Psoriasis     Typically on ears and scalp and around eyelids (arthritis is dominant finding typically for patient)   ??? Psoriatic arthritis (CMS-HCC)     Embril for 7 years prior to starting Humara; hips, shoulders, back, hands sometimes   ??? Vitamin D deficiency      Past Surgical History:   Procedure Laterality Date   ??? CESAREAN SECTION     ??? LIPOSUCTION     ??? PR COLONOSCOPY FLX DX W/COLLJ SPEC WHEN PFRMD N/A 08/11/2014    Procedure: COLONOSCOPY, FLEXIBLE, PROXIMAL TO SPLENIC FLEXURE; DIAGNOSTIC, W/WO COLLECTION SPECIMEN BY BRUSH OR WASH;  Surgeon: Tish Men, MD;  [facility];  Service: Gastroenterology   ??? TYMPANOSTOMY TUBE PLACEMENT       Family History   Problem Relation Age of Onset   ??? Hypertension Mother    ??? Diabetes Mother    ??? Stroke Maternal Grandfather    ??? Heart disease Maternal Grandfather    ??? Melanoma Neg Hx    ??? Basal cell carcinoma Neg Hx    ??? Squamous cell carcinoma Neg Hx      There are no unusual vocation, avocational, or travel exposures    Physical Exam: (unless designated no findings  Elicited)  Vitals:    02/04/20 0937   BP: 125/88   BP Site: L Arm   BP Position: Sitting   BP Cuff Size: Large   Pulse: 80   Weight: 82 kg (180 lb 12.8 oz)   Height: 157.5 cm (5' 2)     General : No apparent distress. Awake, alert, well appearing. Mask in place.   HEENT: Normocephalic, atraumatic. No periorbital edema. Facial muscles move symmetrically.  Neck: Neck is symmetrical with trachea midline.  Eyes: PERRL. Eyelids and conjunctiva normal bilaterally.   Respiratory: Breathing is unlabored, no tachypnea.  Cardiovascular: No edema, no pallor, no cyanosis.  Abdomen: Non-distended.  Skin: No concerning rash or lesions noted on exposed skin.  Extremities: Normal range of motion observed. No peripheral edema.  Neuro: Mood and behavior appropriate for age.  Musculoskeletal: Symmetric and appropriate movements of extremities.    02/20/2019      Previous medical records obtained and reviewed: Reviewed most recent Rheumatology note. Reviewed hospital admission at [facility] 03/03/19 in Care Everywhere.     Component      Latest Ref Rng & Units 10/03/2019 12/13/2019   TSH      0.550 - 4.780 uIU/mL 4.690 (H) 4.678       Impression:  1. Adverse effect of nitrofurantoin, initial encounter    2. Itching    3. Urticaria    4. Seasonal allergic rhinitis due to other allergic trigger      This is a 54 y.o. female with history of psoriasis and psoriatic arthritis who presents for evaluation of possible Macrobid allergy and seasonal allergies, along with what sounds like pressure induced urticaria in the setting of chronic autoimmune disorder. She was seen in the ED in 02/2019 and treated for possible Macrobid  reaction however was subsequently admitted for DKA in the setting of T1DM and not taking her insulin for two days. Seasonal allergies greatest in Spring, Summer, sometimes Winter, gets itchy, swollen eyes. Uses Zaditor. Has history of skin itching but after starting gabapentin this has improved.     Plan:   1. Delayed type reaction to Macrobid: Based on history it sounds like she had low risk reaction that is consistent with a delayed type hypersensitivity reaction or delayed rash that occurred after several days of Macrobid. No anaphylaxis or other moderate/high risk symptoms. She did report fever however was being treated for UTI and given delayed reaction, low suspicion that this was a drug related fever. Would recommend that she avoid Macrobid in the future but if she does need to take medication would recommend treating through which is that she take along with scheduled antihistamine  2. 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 I advised her to take an antihistamine at least 45 minutes before working on her aquarium; can start daily antihistamine if urticaria becomes more frequent.   3. Seasonal allergies: Recommended starting Pataday as needed for allergic conjunctivitis along with saline nasal rinse; can consider formal environmental testing and nasal steroids at future visits  4. Itching: on gabapentin prescribed by her Dermatologist which helps; no changes at this time.     Dispo: RTC in April 2022.    I personally spent 60 minutes face-to-face and non-face-to-face in the care of this patient, which includes all pre, intra, and post visit time on the date of service.    Counseling and Coordination of Care:  See electronic record:  ------------------------------  Tests:No orders of the defined types were placed in this encounter.      This note was entered by [employer] as scribe for Manfred Shirts, MD.  Signature: JAR  Date:02/04/20   Time: 1:11 PM    I have reviewed the documentation provided by the scribe and confirm that it accurately reflects the service I personally performed and the decisions made by me.  Signature: Manfred Shirts, MD, PhD  Date: 02/04/20   Time: 1:11 PM

## 2020-02-04 ENCOUNTER — Encounter: Admit: 2020-02-04 | Discharge: 2020-02-05 | Payer: PRIVATE HEALTH INSURANCE

## 2020-02-04 DIAGNOSIS — J3089 Other allergic rhinitis: Principal | ICD-10-CM

## 2020-02-04 DIAGNOSIS — T378X5A Adverse effect of other specified systemic anti-infectives and antiparasitics, initial encounter: Principal | ICD-10-CM

## 2020-02-04 DIAGNOSIS — L299 Pruritus, unspecified: Principal | ICD-10-CM

## 2020-02-04 DIAGNOSIS — L509 Urticaria, unspecified: Principal | ICD-10-CM

## 2020-02-04 NOTE — Unmapped (Addendum)
Treatment plan:  1. For hives, if you know that you are going to have an exposure such as working in your aquarium then try taking an oral antihistamine (Zyrtec (cetirizine), Allegra (fexofenadine), Xyzal (levocetirizine) or Claritin (loratadine)) about 45 minutes before you do the anticipated activity. Should also take this when you are having hives. The maximum dose is up to 4 pills every 24 hours.  2. For itchy eyes, try the Pataday either once a day or extra strength versions and also you can take one of the above oral antihistamines for allergy symptoms also.      Would avoid Macrobid (nitrofurantoin), but could probably take this in the future if also on regular oral antihistamine during treatment.

## 2020-02-07 ENCOUNTER — Ambulatory Visit: Admit: 2020-02-07 | Discharge: 2020-02-07 | Payer: PRIVATE HEALTH INSURANCE

## 2020-02-07 ENCOUNTER — Encounter
Admit: 2020-02-07 | Discharge: 2020-02-07 | Payer: PRIVATE HEALTH INSURANCE | Attending: Pharmacist Clinician (PhC)/ Clinical Pharmacy Specialist | Primary: Pharmacist Clinician (PhC)/ Clinical Pharmacy Specialist

## 2020-02-07 DIAGNOSIS — M533 Sacrococcygeal disorders, not elsewhere classified: Principal | ICD-10-CM

## 2020-02-07 DIAGNOSIS — E1159 Type 2 diabetes mellitus with other circulatory complications: Principal | ICD-10-CM

## 2020-02-07 DIAGNOSIS — L4050 Arthropathic psoriasis, unspecified: Principal | ICD-10-CM

## 2020-02-07 DIAGNOSIS — I152 Hypertension secondary to endocrine disorders: Principal | ICD-10-CM

## 2020-02-07 DIAGNOSIS — Z79899 Other long term (current) drug therapy: Principal | ICD-10-CM

## 2020-02-07 LAB — CBC W/ AUTO DIFF
BASOPHILS ABSOLUTE COUNT: 0.1 10*9/L (ref 0.0–0.1)
BASOPHILS RELATIVE PERCENT: 1.1 %
EOSINOPHILS ABSOLUTE COUNT: 0.2 10*9/L (ref 0.0–0.7)
HEMATOCRIT: 41.6 % (ref 35.0–44.0)
LYMPHOCYTES ABSOLUTE COUNT: 2.7 10*9/L (ref 0.7–4.0)
LYMPHOCYTES RELATIVE PERCENT: 34.4 %
MEAN CORPUSCULAR HEMOGLOBIN CONC: 33.2 g/dL (ref 30.0–36.0)
MEAN CORPUSCULAR HEMOGLOBIN: 27.8 pg (ref 26.0–34.0)
MEAN CORPUSCULAR VOLUME: 83.8 fL (ref 82.0–98.0)
MEAN PLATELET VOLUME: 9.2 fL (ref 7.0–10.0)
MONOCYTES ABSOLUTE COUNT: 0.4 10*9/L (ref 0.1–1.0)
MONOCYTES RELATIVE PERCENT: 5.5 %
NEUTROPHILS ABSOLUTE COUNT: 4.4 10*9/L (ref 1.7–7.7)
NEUTROPHILS RELATIVE PERCENT: 57.1 %
PLATELET COUNT: 246 10*9/L (ref 150–450)
RED BLOOD CELL COUNT: 4.96 10*12/L (ref 3.90–5.03)
RED CELL DISTRIBUTION WIDTH: 13.3 % (ref 12.0–15.0)
WBC ADJUSTED: 7.7 10*9/L (ref 3.5–10.5)

## 2020-02-07 LAB — POTASSIUM: Potassium:SCnc:Pt:Ser/Plas:Qn:: 3.9

## 2020-02-07 LAB — HEMATOCRIT: Hematocrit:VFr:Pt:Bld:Qn:: 41.6

## 2020-02-07 LAB — CREATININE: Creatinine:MCnc:Pt:Ser/Plas:Qn:: 0.63

## 2020-02-07 MED ORDER — NAPROXEN 500 MG TABLET,DELAYED RELEASE: 500 mg | tablet | Freq: Two times a day (BID) | 5 refills | 30 days | Status: AC

## 2020-02-07 MED ORDER — NAPROXEN 500 MG TABLET,DELAYED RELEASE
ORAL_TABLET | Freq: Two times a day (BID) | ORAL | 5 refills | 30.00000 days | Status: CP
Start: 2020-02-07 — End: 2020-02-07

## 2020-02-07 NOTE — Unmapped (Addendum)
Continue to do exercise.    Diabetes Medicine Instructions:  ?? Levemir 48 units once every day  ?? Ozempic 1 mg once every 7 days    Low Blood Sugars:  For blood sugar less than 70 --> Treat with 4 ounces of juice or regular soda, or with 3 to 4 glucose tablets. Re-check blood sugar in 15 minutes.    --If blood sugar is still less than 70 on re-check, treat again and re-check in 15 minutes.   --If blood sugar is over 70 on re-check and it is time to eat your regular meal, eat regular meal and take insulin as prescribed for that blood sugar.    [employer] makes 4 ounce juice boxes that can be great for treatment of lows.    Try to send me your reports from [facility] by [place] before your next visit.

## 2020-02-07 NOTE — Unmapped (Signed)
If you have not heard from Radiology about scheduling your SI joint injection in a few days call them at [phone].

## 2020-02-07 NOTE — Unmapped (Signed)
Diabetes Enhanced Care Visit  Assessment/Plan:      1. Type 2 diabetes mellitus with microalbuminuria, with long-term current use of insulin (CMS-HCC)    2. Hypertension associated with diabetes (CMS-HCC)        1. Diabetes, type 2  ?? SMBG above goal  Lab Results   Component Value Date    A1C 8.7 (H) 01/24/2020   ?? ; HgbA1c above goal, but improved from 10.3% 10/03/19  ?? Reviewed symptoms and treatment of hypoglycemia, including need to check BG prior to and following treatment.  ?? Pharmacotherapy plan: Continue current regimen as Ozempic was just increased about 1 week ago  Levemir 48 units daily  Ozempic 1mg  weekly    At next visit will consider adding SGLT2i    ?? Diet recommendations: encouraged to follow RD recommendations. Encouraged pt to schedule follow-up with RD.  ?? Exercise recommendations: encouraged daily activity.  ?? SMBG instructions:  continue using Freestyle Libre; send reports via mychart before next visit    DM Related Care  BP at goal today since increasing lisinopril at last visit. on lisinopril 10mg  PO daily, SCr & K WNL today  BP Readings from Last 1 Encounters:   02/07/20 112/78     ??? The 10-year ASCVD risk score Denman George DC Jr., et al., 2013) is: 7.9% continue atorvastatin 80mg  PO once daily  ??? Tobacco use Non-smoker.  ??? Urine ACR due 01/23/21, undetectable at last visit; lisinopril 10mg  daily. Check SCr, K today. WNL  ??? DM eye exam due 04/23/20  ??? Foot exam due 04/23/20  ??? Immunizations due for Shingrix; pt reminded to get at her pharmacy  Immunization History   Administered Date(s) Administered   ??? COVID-19 VACC,MRNA,(PFIZER)(PF)(IM) 06/21/2019, 07/12/2019, 12/04/2019   ??? Influenza Vaccine Quad (IIV4 PF) 66mo+ injectable 01/12/2015, 01/13/2016, 01/09/2017, 01/24/2020   ??? Influenza Virus Vaccine, unspecified formulation 01/30/2018, 02/20/2019   ??? PNEUMOCOCCAL POLYSACCHARIDE 23 09/09/2014   ??? Pneumococcal Conjugate 13-Valent 05/31/2019   ??? TdaP 12/13/2019         Follow-up: Return in about 4 weeks (around 03/06/2020) for Phone visit with [name]; reschedule with Melvenia Beam for return dietitian.    Future Appointments   Date Time Provider Department Center   02/19/2020 10:00 AM Ocie Cornfield Sibenge, RD/LDN [facility]   03/09/2020  1:30 PM Jeanella Flattery, CPP [facility]   06/11/2020  3:00 PM Rumey Sherie Don, MD [facility]   08/04/2020  3:30 PM Manfred Shirts, MD [facility]       Medication adherence and barriers to the treatment plan have been addressed. Opportunities to optimize healthy behaviors have been discussed. Patient / caregiver voiced understanding.      _________________________________________________    Subjective     Reason for visit:    Julia Avila is a 54 y.o. year old female with a history of diabetes (type 2), who presents today for a diabetes enhanced care visit.  Patient presents to this visit alone    Since Last visit / History of Present Ilness:    Patient reports fully implementing plan from last visit.     Reported DM Regimen:    Levemir 48 units nightly  Ozempic 1mg  weekly (increased at last visit 10/8)    Medication Adherence and Access:  Missed doses: denies  Affordability: no concerns    SMBG Per BG meter:    Overall, patient thinks that blood sugars are unchanged since last diabetes related visit.    Using  Freestyle Libre meter with receiver. Reviewed results on receiver. 30d avg 170, 7d avg 172. No hypos. In goal 17.2% 100-140mg /dL      Hypoglycemia:    Symptoms of hypoglycemia since last visit:  no    If no recent hypoglycemia, prior recognition of hypoglycemia symptoms and knowledge of treatment: n/a     Diet:  reports doing well. No juice. Is drinking diet sodas again. Mostly water and unsweet tea.    Exercise:  intermittent (not as much exercise as before)    _________________________________________________    Medications:  Medications were reviewed and updated in Epic today.         Objective     Review of Systems Genitourinary: Positive for frequency (nocturia x 2, frequent during the day; no recent change).   Neurological: Negative for dizziness.   Endo/Heme/Allergies: Negative for polydipsia.        Physical Examination:  Vitals:    Vitals:    02/07/20 1322   BP: 112/78   Pulse: 74   Temp: 36.1 ??C (96.9 ??F)   TempSrc: Temporal   SpO2: 92%   Weight: 81.2 kg (179 lb)   Height: 157.5 cm (5' 2)       Wt Readings from Last 3 Encounters:   02/07/20 81.2 kg (179 lb)   02/07/20 81.5 kg (179 lb 9.6 oz)   02/04/20 82 kg (180 lb 12.8 oz)       Body mass index is 32.74 kg/m??.    Physical Exam  Constitutional:       General: She is not in acute distress.     Appearance: She is obese.   Pulmonary:      Effort: Pulmonary effort is normal.

## 2020-02-07 NOTE — Unmapped (Signed)
Reason for call: Pharmacy called they are out of the Naproxen, can they switch to the  Naproxen immediate .please call w/ verbal  [phone]) or fax over the new prescription      Last ov: 02/07/2020  Next ov: 06/11/2020

## 2020-02-07 NOTE — Unmapped (Signed)
Message left to call on the patients voicemail.

## 2020-02-07 NOTE — Unmapped (Signed)
REASON FOR VISIT: f/u psoriatic arthritis    HISTORY: Julia Avila is a 54 y.o. female with hx of psoriasis (since childhood) and psoriatic arthritis. Has had peripheral and axial involvement.   Treatment hx:   - mtx w/o relief  - Enbrel with good results for 9 years, then loss of efficacy  - Enbrel changed to humira in 2015, gaps in therapy due to recurrent UTIs, found to have nephrolithiasis s/p lithotripsy and ureteral dilation.   Henderson [place] w/o efficacy  - Simponi w/o efficacy  - Cosentyx since 2016.   - Current med regimen: cosentyx 300 mg q 28 days monotherapy     Interim history:  Presents today for follow-up.    She has been ok, but has noticed more stiffness in joints and more low back pain.     Low back pain, really over the R buttock area, for years, but worse in the last 4-5 mo. Intermittent. Previously, when she had this pain, she would go to the chiropractor, that this would relieve the pain for 6 mo or so. Now she feels the need to see the chiropractor often, has been 3 times this month. She does get some relief in the R buttock pain when she sees the chiropractor, but this does not last. She has also been taking IBU 600 mg when needed, has only used 4 times this month. IBU also helps, but effect wears off.  Pain localized to the middle of the R buttock, and sometimes the area feels numb. Pain goes down into the upper leg a little, but never as far as the knee.   Does not feel that this is muscular. Worse with sitting. Driving long distances bothers it.     Sometimes does not feel the cosentyx shot is lasting long enough. She is taking the shot once monthly, and has found a system that helps her remember to take it on time. Feels it is lasting only 2-3 wks. After 2-3 wks, she has more pain in the fingers, low back, and shoulders.     Today stiffness in the hands and mildly painful. Feels swollen in the hands. AM stiffness minimal.      Some flaking in ears and scalp.         CURRENT MEDICATIONS:  Current Outpatient Medications   Medication Sig Dispense Refill   ??? atorvastatin (LIPITOR) 80 MG tablet Take 1 tablet (80 mg total) by mouth daily. 90 tablet 3   ??? blood sugar diagnostic (GLUCOSE BLOOD) Strp Test blood sugars three times daily. ICD-10 E11.9 100 strip 11   ??? blood-gluc transmitter-sensor Misc 1 each by Miscellaneous route every fourteen (14) days. 6 each 3   ??? blood-glucose meter kit Use as instructed 1 each 0   ??? blood-glucose meter Misc Use to check blood sugars three times daily. ICD10 E11.9 1 each 0   ??? buPROPion (WELLBUTRIN XL) 150 MG 24 hr tablet Take 1 tablet (150 mg total) by mouth every morning. 90 tablet 3   ??? clobetasoL (TEMOVATE) 0.05 % external solution Apply topically Two (2) times a day. 50 mL 0   ??? flash glucose sensor (FREESTYLE LIBRE 14 DAY SENSOR) kit by Other route every fourteen (14) days. Please dispense sensors for Freestyle Libre 14 day System. Use to check blood sugars continuously. E11.29 Z79.4. 6 kit 3   ??? FREESTYLE LIBRE 14 DAY READER USE AS DIRECTED 1 each 11   ??? gabapentin (NEURONTIN) 300 MG capsule Take 1 tablet by mouth twice  a day as needed for itching 60 capsule 3   ??? ibuprofen (ADVIL,MOTRIN) 200 MG tablet Take 200 mg by mouth every six (6) hours as needed for pain.     ??? inhalational spacing device Spcr Use with inhaler to maximize medication delivery to lungs 1 each 0   ??? insulin detemir U-100 (LEVEMIR) 100 unit/mL (3 mL) injection pen Inject 0.48 mL (48 Units total) under the skin nightly. 14.4 mL 2   ??? lancets Misc 1 each by Other route once daily. Test daily before breakfast. Dispense 90 day supply. 100 each 3   ??? lisinopriL (PRINIVIL,ZESTRIL) 10 MG tablet Take 1 tablet (10 mg total) by mouth daily. 90 tablet 3   ??? Magic Mouthwash (nystatin/diphenhydramine/mylanta) Oral Mixture Take 5 mL by mouth every six (6) hours as needed. Swish, gargle, spit as needed.  May be swallowed if esophageal involvement. 120 mL 0   ??? mometasone (ELOCON) 0.1 % lotion Apply with cotton ball to ear canals t bilaterally once every 2weeks 60 mL 0   ??? mupirocin (BACTROBAN) 2 % ointment Apply topically to open areas on arms twice daily as needed. 30 g 4   ??? pen needle, diabetic (BD ULTRA-FINE NANO PEN NEEDLE) 32 gauge x 5/32 (4 mm) Ndle Needles for insulin and Ozempic pen. Inject daily as instructed. ICD-10 E11.9 100 each 3   ??? secukinumab (COSENTYX) 150 mg/mL PnIj injection Inject the contents of 2 pens (300mg  total) under the skin every 28 days 6 mL 3   ??? semaglutide (OZEMPIC) 1 mg/dose (4 mg/3 mL) PnIj injection Inject 1 mg under the skin every seven (7) days. 3 mL 11   ??? triamcinolone (KENALOG) 0.1 % ointment Apply topically Two (2) times a day. 80 g 2     No current facility-administered medications for this visit.       Past Medical History:   Diagnosis Date   ??? Diabetes mellitus (CMS-HCC)    ??? Essential hypertension 01/09/2017   ??? Hypertension    ??? Hypothyroidism    ??? Nephrolithiasis    ??? Psoriasis     Typically on ears and scalp and around eyelids (arthritis is dominant finding typically for patient)   ??? Psoriatic arthritis (CMS-HCC)     Embril for 7 years prior to starting Humara; hips, shoulders, back, hands sometimes   ??? Vitamin D deficiency         Record Review: Available records were reviewed, including pertinent office visits, labs, and imaging.      REVIEW OF SYSTEMS: Ten system were reviewed and negative except as noted above.    PHYSICAL EXAM:  VITAL SIGNS:   Vitals:    02/07/20 1039   BP: 115/78   BP Site: L Arm   BP Position: Sitting   BP Cuff Size: Large   Pulse: 76   Temp: 36.3 ??C (97.3 ??F)   TempSrc: Temporal   Weight: 81.5 kg (179 lb 9.6 oz)     General:   Pleasant 54 y.o.female in no acute distress, WDWN   Eyes:   PERRL, conjunctiva and sclera not inflamed. Tears appear adequate.    Cardiovascular:  Regular rate and rhythm. No murmur, rub, or gallop. No lower extremity edema.    Lungs:  Clear to auscultation.Normal respiratory effort.    Musculoskeletal:   General: Ambulates w/o assistance   Hands: Bony enlargement of scattered DIPs.  No swelling.  Able to make a tight fist laterally.  Wrists:FROM w/o swelling or tenderness   Elbows:  FROM w/o swelling or tenderness   Shoulders: FROM w/o pain   Spine: Occiput well 0 cm.  Modified Schober with 6.5 cm difference. Pain in R SI joint with FABER.   Hips: FROM   Knees: FROM w/o effusions.  Crepitus on range of motion.  Ankles: No swelling or tenderness   Feet: No pain with MTP squeeze    Neurological:  CN 2-12 grossly intact. 5/5 strength on extremities.   Psych:  Appropriate affect and mood   Skin:  No rashes.  No psoriasis.         ASSESSMENT/PLAN:  1. Psoriatic arthritis (CMS-HCC)  Overall feeling persistent pain and stiffness in hands and pain in the R SI joint. SI joint seems to be the main issue today.   We discussed that this may be related to psoriatic arthritis. Could consider: 1- change in biologic DMARD, 2- SI joint injection, 3- scheduled NSAID. She is interested in pursuing SI joint injection. CT guided right SI joint injection ordered.   Will also try switching IBU to longer acting NSAID, trial of naproxen 500 mg BID prn.   - CT Sacroiliac Jt Injection Incld Guide; Future  - naproxen (EC NAPROSYN) 500 MG EC tablet; Take 1 tablet (500 mg total) by mouth two (2) times a day as needed.  Dispense: 60 tablet; Refill: 5    2. High risk medication use  Checking labs below to evaluate for medication toxicity.    - CBC w/ Differential  - Creatinine    HCM:   - PCV13 Status: 05/31/2019  - PPSV 23 Status: 09/08/24  - Annual Influenza vaccine. Status: 01/24/20  - Bone health: not on prednisone   - Contraception: Postmenopausal  - COVID-19 vaccine: Pfizer 06/21/19, 07/12/19, 12/04/19      Return appointment in 4 mo with Dr Scarlette Calico    30 minutes was spent with the patient, over half of which was counseling regarding dx and treatment plan.

## 2020-02-07 NOTE — Unmapped (Signed)
[facility] at [facility]       Type of visit:  face to face    Reason for visit: Medication management    Questions / Concerns that need to be addressed: none    General Consent to Treat (GCT) for non-epic video visits only: Verbal consent      Diabetes:  ??? Regularly checking blood sugars?: no  o If yes, when? Complete log for past 7 days  Date Before Breakfast After Breakfast Before Lunch After Lunch Before Dinner After Dinner Before Bed                                                                                                                                     Hypertension:  ??? Have blood pressure cuff at home?: no  ??? Regularly checking blood pressure?: no  If yes, complete log for past 7 days  Date Time BP Pulse                                                                             Screening BP- 112/78            HCDM reviewed and updated in Epic:    We are working to make sure all of our patients??? wishes are updated in Epic and part of that is documenting a Environmental health practitioner for each patient  A Health Care Decision Rodena Piety is someone you choose who can make health care decisions for you if you are not able ??? who would you most want to do this for you????  is already up to date.        BPAs completed:  none      COVID-19 Vaccine Summary  Type:  Dates Given:  12/04/2019                     Immunization History   Administered Date(s) Administered   ??? COVID-19 VACC,MRNA,(PFIZER)(PF)(IM) 06/21/2019, 07/12/2019, 12/04/2019   ??? Influenza Vaccine Quad (IIV4 PF) 27mo+ injectable 01/12/2015, 01/13/2016, 01/09/2017, 01/24/2020   ??? Influenza Virus Vaccine, unspecified formulation 01/30/2018, 02/20/2019   ??? PNEUMOCOCCAL POLYSACCHARIDE 23 09/09/2014   ??? Pneumococcal Conjugate 13-Valent 05/31/2019   ??? TdaP 12/13/2019       __________________________________________________________________________________________    SCREENINGS COMPLETED IN FLOWSHEETS    HARK Screening       AUDIT PHQ2       PHQ9          P4 Suicidality Screener                GAD7       COPD  Assessment       Falls Risk       .imcres

## 2020-02-07 NOTE — Unmapped (Signed)
Called Ms. Lemar because she is late for her visit and had an early visit in the same building today (so I wasn't sure if she had planned to stay to see me). No answer. Left voicemail.    I will add on a potassium to the labs ordered today by rheum to monitor recent lisinopril increase. BP was at goal.    I will also send her a mychart message asking about the increase in Ozempic and asking if she can share data from [facility] via [place].    Forde Radon, PharmD, CPP  [facility]

## 2020-02-19 NOTE — Unmapped (Signed)
[facility] Refill Coordination Note    Specialty Medication(s) to be Shipped:   Inflammatory Disorders: Cosentyx    Other medication(s) to be shipped: No additional medications requested for fill at this time     Julia Avila, DOB: 04/25/1965  Phone: [phone] (home)       All above HIPAA information was verified with patient.     Was a Nurse, learning disability used for this call? No    Completed refill call assessment today to schedule patient's medication shipment from the [facility] [phone]).       Specialty medication(s) and dose(s) confirmed: Regimen is correct and unchanged.   Changes to medications: Julia Avila reports no changes at this time.  Changes to insurance: No  Questions for the pharmacist: No    Confirmed patient received Welcome Packet with first shipment. The patient will receive a drug information handout for each medication shipped and additional FDA Medication Guides as required.       DISEASE/MEDICATION-SPECIFIC INFORMATION        For patients on injectable medications: Patient currently has 0 doses left.  Next injection is scheduled for 11/15.    SPECIALTY MEDICATION ADHERENCE     Medication Adherence    Patient reported X missed doses in the last month: 0  Specialty Medication: Cosentyx  Patient is on additional specialty medications: No  Patient is on more than two specialty medications: No  Any gaps in refill history greater than 2 weeks in the last 3 months: no  Demonstrates understanding of importance of adherence: yes  Informant: patient                Cosentyx 150mg /ml: Patient has 0 days of medication on hand      SHIPPING     Shipping address confirmed in Epic.     Delivery Scheduled: Yes, Expected medication delivery date: 11/12.     Medication will be delivered via UPS to the prescription address in Epic WAM.    Olga Millers   [facility] Specialty Technician

## 2020-02-27 MED FILL — COSENTYX PEN 300 MG/2 PENS (150 MG/ML) SUBCUTANEOUS: 28 days supply | Qty: 2 | Fill #8 | Status: AC

## 2020-02-27 MED FILL — COSENTYX PEN 300 MG/2 PENS (150 MG/ML) SUBCUTANEOUS: SUBCUTANEOUS | 28 days supply | Qty: 2 | Fill #8

## 2020-03-09 ENCOUNTER — Encounter
Admit: 2020-03-09 | Discharge: 2020-03-10 | Payer: PRIVATE HEALTH INSURANCE | Attending: Pharmacist Clinician (PhC)/ Clinical Pharmacy Specialist | Primary: Pharmacist Clinician (PhC)/ Clinical Pharmacy Specialist

## 2020-03-09 DIAGNOSIS — Z794 Long term (current) use of insulin: Principal | ICD-10-CM

## 2020-03-09 DIAGNOSIS — R809 Proteinuria, unspecified: Principal | ICD-10-CM

## 2020-03-09 DIAGNOSIS — E1129 Type 2 diabetes mellitus with other diabetic kidney complication: Principal | ICD-10-CM

## 2020-03-09 MED ORDER — SHINGRIX (PF) 50 MCG/0.5 ML INTRAMUSCULAR SUSPENSION, KIT
Freq: Once | INTRAMUSCULAR | 1 refills | 1 days | Status: CP
Start: 2020-03-09 — End: 2020-03-09

## 2020-03-09 NOTE — Unmapped (Signed)
[facility] at [facility]       Type of visit:  face to face    Reason for visit: diabetes     Questions / Concerns that need to be addressed: none     General Consent to Treat (GCT) for non-epic video visits only: Verbal consent      Diabetes:  Regularly checking blood sugars?: yes (on freestyle 14 day, having difficulty getting sensors)     Hypertension:  ??? Have blood pressure cuff at home?: yes- arm cuff  ??? Regularly checking blood pressure?: no    Allergies reviewed: Yes    Medication reviewed: Yes  Pended refills? No        HCDM reviewed and updated in Epic:    We are working to make sure all of our patients??? wishes are updated in Epic and part of that is documenting a Environmental health practitioner for each patient  A Health Care Decision Rodena Piety is someone you choose who can make health care decisions for you if you are not able ??? who would you most want to do this for you????  is already up to date.        BPAs completed:  AUDIT - Alcohol Screen      COVID-19 Vaccine Summary  Which COVID-19 Vaccine was administered  Pfizer  Type:  Dates Given:  12/04/2019                 Immunization History   Administered Date(s) Administered   ??? COVID-19 VACC,MRNA,(PFIZER)(PF)(IM) 06/21/2019, 07/12/2019, 12/04/2019   ??? Influenza Vaccine Quad (IIV4 PF) 41mo+ injectable 01/12/2015, 01/13/2016, 01/09/2017, 01/24/2020   ??? Influenza Virus Vaccine, unspecified formulation 01/30/2018, 02/20/2019   ??? PNEUMOCOCCAL POLYSACCHARIDE 23 09/09/2014   ??? Pneumococcal Conjugate 13-Valent 05/31/2019   ??? TdaP 12/13/2019       __________________________________________________________________________________________    SCREENINGS COMPLETED IN FLOWSHEETS    HARK Screening       AUDIT       PHQ2       PHQ9          P4 Suicidality Screener                GAD7       COPD Assessment       Falls Risk

## 2020-03-09 NOTE — Unmapped (Signed)
Internal Medicine Phone Visit    This visit is conducted via telephone.    Contact Information  Person Contacted: Patient  Contact Phone number: [phone] (home)   Is there someone else in the room? No.   Patient agreed to a phone visit    Julia Avila is a 54 y.o. female  participating in a telephone encounter.    Reason for Call  Diabetes    Subjective:  Last visit pharmacotherapy plan: Continue current regimen as Ozempic was just increased about 1 week ago  - Levemir 48 units daily  - Ozempic 1mg  weekly    Today, she reports she has had more allergies and hand pain over past week. No other complaints.    SMBG  Has had trouble getting [name] app on her phone. App is not compatible with her phone. Does have receiver. Has not had a sensor for last few days because she hasn't been able to get them from the pharmacy. She plans to pick these up today.    Usually scans before eating (occasionally 2 hours post-prandial), occasionally random BG  Avg 14d 126, 30d 130, 90d 154    Lowest BG 92, highest 170    Exercise: has been walking outside 2x per week (still paying for gym membership)  Diet    ROS:   + Blurry vision (reading and night time). Needs to go to back to optometrist.  - dry mouth (improved with toothpaste change)  - frequent urination    I have reviewed the problem list, medications, and allergies and have updated/reconciled them if needed.    Objective:  Sounds well with no acute distress noted    PHQ-9 Score:     GAD-7 Score:         Assessment & Plan:  1. Type 2 diabetes mellitus with microalbuminuria, with long-term current use of insulin (CMS-HCC)      Lab Results   Component Value Date    A1C 8.7 (H) 01/24/2020   - SMBG avg at goal over past 14 days without hypoglycemia; BG trend decreasing since Ozempic dose was increased ~5 weeks ago  - Continue Levemir 48 units daily; Ozempic 1mg  weekly  - Check A1c at next visit in 2 months  - Encouraged walking or gym daily   - continue to follow RD diet instructions      DM related HCM  ?? BP at goal at last visit on lisinopril 10mg  PO daily, SCr & K WNL at last visit. No changes  ?? The 10-year ASCVD risk score Denman George DC Jr., et al., 2013) is: 7.9% continue atorvastatin 80mg  PO once daily  ?? Tobacco use Non-smoker.  ?? Urine ACR due 01/23/21, undetectable at last visit; lisinopril 10mg  daily.  ?? DM eye exam due 04/23/20. Pt will call to schedule with eye doctor as she has also noticed blurry vision with reading  ?? Foot exam due 04/23/20  ?? Immunizations due for Shingrix; pt reminded to get at her pharmacy. Sent rx today to facilitate.      Return in about 2 months (around 05/09/2020) for Recheck diabetes face to face on a Wednesday with Forde Radon.       I spent 17 minutes on the phone with the patient on the date of service. I spent an additional 10 minutes on pre- and post-visit activities on the date of service.     The patient was physically located in [place] or a state in which I am permitted to provide care.  The patient and/or parent/guardian understood that s/he may incur co-pays and cost sharing, and agreed to the telemedicine visit. The visit was reasonable and appropriate under the circumstances given the patient's presentation at the time.    The patient and/or parent/guardian has been advised of the potential risks and limitations of this mode of treatment (including, but not limited to, the absence of in-person examination) and has agreed to be treated using telemedicine. The patient's/patient's family's questions regarding telemedicine have been answered.     If the visit was completed in an ambulatory setting, the patient and/or parent/guardian has also been advised to contact their provider???s office for worsening conditions, and seek emergency medical treatment and/or call 911 if the patient deems either necessary.

## 2020-03-10 NOTE — Unmapped (Signed)
I was the supervising physician in the delivery of the service. Jaxxen Voong E Kimel-Scott, MD

## 2020-03-16 DIAGNOSIS — R197 Diarrhea, unspecified: Principal | ICD-10-CM

## 2020-03-16 DIAGNOSIS — R110 Nausea: Principal | ICD-10-CM

## 2020-03-16 DIAGNOSIS — L4050 Arthropathic psoriasis, unspecified: Principal | ICD-10-CM

## 2020-03-16 NOTE — Unmapped (Signed)
Referral to GI to eval for IBD given reported hx of diarrhea and nausea in the setting of psoriatic arthritis.

## 2020-03-18 DIAGNOSIS — B009 Herpesviral infection, unspecified: Principal | ICD-10-CM

## 2020-03-18 MED ORDER — VALACYCLOVIR 1 GRAM TABLET
ORAL_TABLET | Freq: Two times a day (BID) | ORAL | 0 refills | 10.00000 days | Status: CP
Start: 2020-03-18 — End: 2020-03-23

## 2020-03-18 NOTE — Unmapped (Signed)
1x attempt per in basket. Text patient to call clinic & schedule with PCP.    Julia Avila  03/18/20

## 2020-03-18 NOTE — Unmapped (Signed)
MCADAMS, ESSENCE S writes on Wed Mar 18, 2020 1012 from [facility] INTERNAL MEDICINE GASTROENTEROLOGY REFERRALS TO BE SCHEDULED [[id number]]: Expire   03/18/2020 called patient lmom to call back to schedule a GI consult with Dr. Lolita Lenz...emcadams      ELLISON, LISA writes on Wed Mar 18, 2020 1009 from [facility] INTERNAL MEDICINE GASTROENTEROLOGY REFERRALS TO BE SCHEDULED [[id number]]: Expire   03/18/20 OK TO SCHEDULE CONSULT PER DR. Bynum Bellows, LISA writes on Wed Mar 18, 2020 0912 from [facility] INTERNAL MEDICINE GASTROENTEROLOGY REFERRALS TO BE SCHEDULED [[id number]]: Expire   03/18/20 MESSAGE SENT TO DR. [facility] FOR REVIEW

## 2020-03-19 NOTE — Unmapped (Signed)
MCADAMS, ESSENCE S writes on Thu Mar 19, 2020 0913 from [facility] INTERNAL MEDICINE GASTROENTEROLOGY REFERRALS TO BE SCHEDULED [[id number]]: Expire   03/19/2020 called patient lmom to call back to schedule a GI consult with Dr. Lolita Lenz...emcadams      MCADAMS, ESSENCE S writes on Wed Mar 18, 2020 1012 from [facility] INTERNAL MEDICINE GASTROENTEROLOGY REFERRALS TO BE SCHEDULED [[id number]]: Expire   03/18/2020 called patient lmom to call back to schedule a GI consult with Dr. Lolita Lenz...emcadams      ELLISON, LISA writes on Wed Mar 18, 2020 1009 from [facility] INTERNAL MEDICINE GASTROENTEROLOGY REFERRALS TO BE SCHEDULED [[id number]]: Expire   03/18/20 OK TO SCHEDULE CONSULT PER DR. Bynum Bellows, LISA writes on Wed Mar 18, 2020 0912 from [facility] INTERNAL MEDICINE GASTROENTEROLOGY REFERRALS TO BE SCHEDULED [[id number]]: Expire   03/18/20 MESSAGE SENT TO DR. [facility] FOR REVIEW

## 2020-03-20 NOTE — Unmapped (Signed)
[facility] Refill Coordination Note    Specialty Medication(s) to be Shipped:   Inflammatory Disorders: Cosentyx    Other medication(s) to be shipped: No additional medications requested for fill at this time     Julia Avila, DOB: [birth year]  Phone: [phone] (home)       All above HIPAA information was verified with patient.     Was a Nurse, learning disability used for this call? No    Completed refill call assessment today to schedule patient's medication shipment from the [facility] [phone]).       Specialty medication(s) and dose(s) confirmed: Regimen is correct and unchanged.   Changes to medications: [name] reports no changes at this time.  Changes to insurance: No  Questions for the pharmacist: No    Confirmed patient received Welcome Packet with first shipment. The patient will receive a drug information handout for each medication shipped and additional FDA Medication Guides as required.       DISEASE/MEDICATION-SPECIFIC INFORMATION        For patients on injectable medications: Patient currently has 0 doses left.  Next injection is scheduled for 12.15.    SPECIALTY MEDICATION ADHERENCE     Medication Adherence    Patient reported X missed doses in the last month: 0  Specialty Medication: Cosentyx  Patient is on additional specialty medications: No  Patient is on more than two specialty medications: No  Any gaps in refill history greater than 2 weeks in the last 3 months: no  Demonstrates understanding of importance of adherence: yes  Informant: patient                Cosentyx 150mg /ml: Patient has 0 days of medication on hand      SHIPPING     Shipping address confirmed in Epic.     Delivery Scheduled: Yes, Expected medication delivery date: 12/14.     Medication will be delivered via UPS to the prescription address in Epic WAM.    Olga Millers   [facility] Specialty Technician

## 2020-03-20 NOTE — Unmapped (Signed)
MCADAMS, ESSENCE S writes on Fri Mar 20, 2020 1458 from [facility] INTERNAL MEDICINE GASTROENTEROLOGY REFERRALS TO BE SCHEDULED [[id number]]: Expire   03/20/2020 called patient Julia Avila to call back to schedule GI consult with Dr. Concha Pyo...emcadams      MCADAMS, ESSENCE S writes on Thu Mar 19, 2020 0913 from [facility] INTERNAL MEDICINE GASTROENTEROLOGY REFERRALS TO BE SCHEDULED [[id number]]: Expire   03/19/2020 called patient Julia Avila to call back to schedule a GI consult with Dr. Lolita Lenz...emcadams      MCADAMS, ESSENCE S writes on Wed Mar 18, 2020 1012 from [facility] INTERNAL MEDICINE GASTROENTEROLOGY REFERRALS TO BE SCHEDULED [[id number]]: Expire   03/18/2020 called patient Julia Avila to call back to schedule a GI consult with Dr. Lolita Lenz...emcadams      ELLISON, LISA writes on Wed Mar 18, 2020 1009 from [facility] INTERNAL MEDICINE GASTROENTEROLOGY REFERRALS TO BE SCHEDULED [[id number]]: Expire   03/18/20 OK TO SCHEDULE CONSULT PER DR. Bynum Bellows, LISA writes on Wed Mar 18, 2020 0912 from [facility] INTERNAL MEDICINE GASTROENTEROLOGY REFERRALS TO BE SCHEDULED [[id number]]: Expire   03/18/20 MESSAGE SENT TO DR. [facility] FOR REVIEW

## 2020-03-30 MED FILL — COSENTYX PEN 300 MG/2 PENS (150 MG/ML) SUBCUTANEOUS: 28 days supply | Qty: 2 | Fill #9 | Status: AC

## 2020-03-30 MED FILL — COSENTYX PEN 300 MG/2 PENS (150 MG/ML) SUBCUTANEOUS: SUBCUTANEOUS | 28 days supply | Qty: 2 | Fill #9

## 2020-03-31 ENCOUNTER — Encounter
Admit: 2020-03-31 | Discharge: 2020-04-01 | Payer: PRIVATE HEALTH INSURANCE | Attending: Internal Medicine | Primary: Internal Medicine

## 2020-03-31 DIAGNOSIS — N200 Calculus of kidney: Principal | ICD-10-CM

## 2020-03-31 DIAGNOSIS — E039 Hypothyroidism, unspecified: Principal | ICD-10-CM

## 2020-03-31 DIAGNOSIS — R197 Diarrhea, unspecified: Principal | ICD-10-CM

## 2020-03-31 DIAGNOSIS — K317 Polyp of stomach and duodenum: Principal | ICD-10-CM

## 2020-03-31 DIAGNOSIS — Z794 Long term (current) use of insulin: Principal | ICD-10-CM

## 2020-03-31 DIAGNOSIS — K209 Esophagitis, unspecified without bleeding: Principal | ICD-10-CM

## 2020-03-31 DIAGNOSIS — Z6830 Body mass index (BMI) 30.0-30.9, adult: Principal | ICD-10-CM

## 2020-03-31 DIAGNOSIS — I10 Essential (primary) hypertension: Principal | ICD-10-CM

## 2020-03-31 DIAGNOSIS — E119 Type 2 diabetes mellitus without complications: Principal | ICD-10-CM

## 2020-03-31 DIAGNOSIS — L4050 Arthropathic psoriasis, unspecified: Principal | ICD-10-CM

## 2020-03-31 DIAGNOSIS — E669 Obesity, unspecified: Principal | ICD-10-CM

## 2020-03-31 DIAGNOSIS — R110 Nausea: Principal | ICD-10-CM

## 2020-03-31 DIAGNOSIS — K2950 Unspecified chronic gastritis without bleeding: Principal | ICD-10-CM

## 2020-03-31 DIAGNOSIS — E559 Vitamin D deficiency, unspecified: Principal | ICD-10-CM

## 2020-03-31 MED ORDER — PEG-ELECTROLYTE SOLUTION 420 GRAM ORAL SOLUTION
Freq: Once | ORAL | 0 refills | 1.00000 days | Status: CP
Start: 2020-03-31 — End: 2020-04-04

## 2020-03-31 MED ORDER — PANTOPRAZOLE 40 MG TABLET,DELAYED RELEASE
ORAL_TABLET | Freq: Every day | ORAL | 4 refills | 30 days | Status: CP
Start: 2020-03-31 — End: 2020-04-30

## 2020-04-01 ENCOUNTER — Encounter: Admit: 2020-04-01 | Discharge: 2020-04-02 | Payer: PRIVATE HEALTH INSURANCE

## 2020-04-01 DIAGNOSIS — R110 Nausea: Principal | ICD-10-CM

## 2020-04-02 NOTE — Unmapped (Signed)
Assessment and Plan  1. Diarrhea, unspecified type -DM assoc,meds for DM,  W note of abdo pain will check egd and colonoscopy,bx duod,stomach and colon   2. Nausea -gastric dysmotility,pud,less likely biliary issue  Egd and u/s as noted   3. Psoriatic arthritis (CMS-HCC)        Orders Placed This Encounter   Procedures   ??? US Abdomen Complete   ??? Alpha Gal IGE   ??? TSH   ??? IgA   ??? Tissue transglutaminase, IgA   ??? EGD   ??? Colonoscopy   Procedures,prep risks and alternatives explained at length    Signs and symptoms that should prompt return sooner than scheduled were reviewed with the patient.  Visit time: In excess of 45 minutes, more than 50% of which was spent face-to-face in counseling and education activities regarding the diagnoses above.      HPI    Chief Complaint  Chief Complaint   Patient presents with   ??? Diarrhea     intermittent diarrhea/constipation - per patient has stopped all oral meds about 2 weeks ago     Julia Avila is here for abdominal pain,altered bowel function. As child was constipated. Since has had DM has more diarrhea. Bowels can fluctuate diarrhea to constipation. BM every 2-3 days. Can have freq BM . Is aware of and reluctant to go places w/o public bathrooms. Has some nausea,no vomit. Meds taken can exacerbate. Has hx gerd in past. Some nocturnal occurrence. No dysphagia. Does use some nsaids. Meat can exacerbate sx.   Abdo pain lasts 1 hr,6/10 assoc anorexia,nausea and altered bowel function. Better if drinks liquids.   Prev colonoscopy 5 yrs ago re rlq abdo pain unremarkable.    Allergies:  Nitrofurantoin macrocrystal and Metformin    Medications:   Outpatient Medications Prior to Visit   Medication Sig Dispense Refill   ??? atorvastatin (LIPITOR) 80 MG tablet Take 1 tablet (80 mg total) by mouth daily. 90 tablet 3   ??? blood sugar diagnostic (GLUCOSE BLOOD) Strp Test blood sugars three times daily. ICD-10 E11.9 100 strip 11   ??? blood-gluc transmitter-sensor Misc 1 each by Miscellaneous route every fourteen (14) days. 6 each 3   ??? blood-glucose meter kit Use as instructed 1 each 0   ??? blood-glucose meter Misc Use to check blood sugars three times daily. ICD10 E11.9 1 each 0   ??? buPROPion (WELLBUTRIN XL) 150 MG 24 hr tablet Take 1 tablet (150 mg total) by mouth every morning. 90 tablet 3   ??? clobetasoL (TEMOVATE) 0.05 % external solution Apply topically Two (2) times a day. 50 mL 0   ??? flash glucose sensor (FREESTYLE LIBRE 14 DAY SENSOR) kit by Other route every fourteen (14) days. Please dispense sensors for Freestyle Libre 14 day System. Use to check blood sugars continuously. E11.29 Z79.4. 6 kit 3   ??? FREESTYLE LIBRE 14 DAY READER USE AS DIRECTED 1 each 11   ??? gabapentin (NEURONTIN) 300 MG capsule Take 1 tablet by mouth twice a day as needed for itching 60 capsule 3   ??? inhalational spacing device Spcr Use with inhaler to maximize medication delivery to lungs 1 each 0   ??? insulin detemir U-100 (LEVEMIR) 100 unit/mL (3 mL) injection pen Inject 0.48 mL (48 Units total) under the skin nightly. 14.4 mL 2   ??? lancets Misc 1 each by Other route once daily. Test daily before breakfast. Dispense 90 day supply. 100 each 3   ??? lisinopriL (  PRINIVIL,ZESTRIL) 10 MG tablet Take 1 tablet (10 mg total) by mouth daily. 90 tablet 3   ??? Magic Mouthwash (nystatin/diphenhydramine/mylanta) Oral Mixture Take 5 mL by mouth every six (6) hours as needed. Swish, gargle, spit as needed.  May be swallowed if esophageal involvement. 120 mL 0   ??? mometasone (ELOCON) 0.1 % lotion Apply with cotton ball to ear canals t bilaterally once every 2weeks 60 mL 0   ??? mupirocin (BACTROBAN) 2 % ointment Apply topically to open areas on arms twice daily as needed. 30 g 4   ??? naproxen (EC NAPROSYN) 500 MG EC tablet Take 1 tablet (500 mg total) by mouth two (2) times a day as needed. 60 tablet 5   ??? pen needle, diabetic (BD ULTRA-FINE NANO PEN NEEDLE) 32 gauge x 5/32 (4 mm) Ndle Needles for insulin and Ozempic pen. Inject daily as instructed. ICD-10 E11.9 100 each 3   ??? secukinumab (COSENTYX) 150 mg/mL PnIj injection Inject the contents of 2 pens (300mg  total) under the skin every 28 days 6 mL 3   ??? semaglutide (OZEMPIC) 1 mg/dose (4 mg/3 mL) PnIj injection Inject 1 mg under the skin every seven (7) days. 3 mL 11   ??? triamcinolone (KENALOG) 0.1 % ointment Apply topically Two (2) times a day. 80 g 2     No facility-administered medications prior to visit.       Medical History:  Past Medical History:   Diagnosis Date   ??? Diabetes mellitus (CMS-HCC)    ??? Essential hypertension 01/09/2017   ??? Hypertension    ??? Hypothyroidism    ??? Nephrolithiasis    ??? Psoriasis     Typically on ears and scalp and around eyelids (arthritis is dominant finding typically for patient)   ??? Psoriatic arthritis (CMS-HCC)     Embril for 7 years prior to starting Humara; hips, shoulders, back, hands sometimes   ??? Vitamin D deficiency        Surgical History:  Past Surgical History:   Procedure Laterality Date   ??? CESAREAN SECTION     ??? LIPOSUCTION     ??? PR COLONOSCOPY FLX DX W/COLLJ SPEC WHEN PFRMD N/A 08/11/2014    Procedure: COLONOSCOPY, FLEXIBLE, PROXIMAL TO SPLENIC FLEXURE; DIAGNOSTIC, W/WO COLLECTION SPECIMEN BY BRUSH OR WASH;  Surgeon: Tish Men, MD;  [facility];  Service: Gastroenterology   ??? TYMPANOSTOMY TUBE PLACEMENT         Social History:  Tobacco use:   reports that she has never smoked. She has never used smokeless tobacco.  Alcohol use:   reports no history of alcohol use.  Drug use:  reports no history of drug use.      Family History:  Family History   Problem Relation Age of Onset   ??? Hypertension Mother    ??? Diabetes Mother    ??? Stroke Maternal Grandfather    ??? Heart disease Maternal Grandfather            Review of Systems:  ROS:   General ROS: negative for - fever,weight loss,+fatigue,loss of appetite  HEENT ROS: negative for sore throat, or rhinitis,+hearing loss,mouth sores  Respiratory ROS: no cough, shortness of breath, or wheezing  Cardiovascular ROS: no chest pain or dyspnea on exertion  Gastrointestinal ROS: no abdominal pain, change in bowel habits, or black or bloody stools  Genito-Urinary ROS: no dysuria or hematuria  Musculoskeletal ROS: psoriasis (since childhood) and psoriatic arthritis. Has had peripheral and axial involvement.  Treatment hx:   - mtx w/o relief  - Enbrel with good results for 9 years, then loss of efficacy  - Enbrel changed to humira in 2015, gaps in therapy due to recurrent UTIs, found to have nephrolithiasis s/p lithotripsy and ureteral dilation.   H[facility] w/o efficacy  - Simponi w/o efficacy  - Cosentyx since 2016.   - Current med regimen: cosentyx 300 mg q 28 days monotherapy   Dermatological ROS: negative for rash or new lesions  Neuro:no headaches  Psych:depression  Endocrine:DM,issues w meds    Vitals:    03/31/20 1505   BP: 130/82   BP Site: L Arm   BP Position: Sitting   Pulse: 100   Temp: 36.7 ??C (98 ??F)   SpO2: 97%   Weight: 80.7 kg (178 lb)   Height: 160 cm (5' 3)       Physical Exam  General: alert, oriented, no distress  EYES: Anicteric sclerae.  NECK: no lymphadenopathy,   RESP: Relaxed respiratory effort. Clear to auscultation without wheezes or crackles.    CV: Regular rate and rhythm. Normal S1 and S2. No murmurs or gallops.  Marland Kitchen   ABDO:soft,nontender,no hsm,no guarding or rebound,no distension  EXT:  No peripheral edema  Skin:no rash  Neuro:no focal deficits    Answers for HPI/ROS submitted by the patient on 03/30/2020  Chronicity: new  Onset: more than 1 month ago  Onset quality: gradual  Frequency: daily  Episode duration: 1 hours  Progression since onset: waxing and waning  Pain location: generalized abdominal region  Pain - numeric: 6/10  Pain quality: aching, a sensation of fullness  anorexia: Yes  arthralgias: Yes  belching: No  constipation: Yes  diarrhea: Yes  dysuria: No  fever: No  flatus: No  frequency: Yes  headaches: No  hematochezia: No  hematuria: No  melena: No  myalgias: No  nausea: Yes  weight loss: No  vomiting: No  Relieved by: liquids

## 2020-04-03 MED FILL — PEG-ELECTROLYTE SOLUTION 420 GRAM ORAL SOLUTION: 1 days supply | Qty: 4000 | Fill #0 | Status: AC

## 2020-04-03 MED FILL — PEG-ELECTROLYTE SOLUTION 420 GRAM ORAL SOLUTION: ORAL | 1 days supply | Qty: 4000 | Fill #0

## 2020-04-06 NOTE — Unmapped (Signed)
PA submitted for pantoprazole via cover my meds today.

## 2020-04-14 ENCOUNTER — Encounter: Admit: 2020-04-14 | Discharge: 2020-04-15 | Payer: PRIVATE HEALTH INSURANCE

## 2020-04-16 ENCOUNTER — Encounter: Admit: 2020-04-16 | Discharge: 2020-04-17 | Payer: PRIVATE HEALTH INSURANCE

## 2020-04-16 ENCOUNTER — Ambulatory Visit: Admit: 2020-04-16 | Discharge: 2020-04-17 | Payer: PRIVATE HEALTH INSURANCE

## 2020-04-16 DIAGNOSIS — R0781 Pleurodynia: Principal | ICD-10-CM

## 2020-04-16 DIAGNOSIS — R0789 Other chest pain: Principal | ICD-10-CM

## 2020-04-16 DIAGNOSIS — W19XXXA Unspecified fall, initial encounter: Principal | ICD-10-CM

## 2020-04-16 DIAGNOSIS — R071 Chest pain on breathing: Principal | ICD-10-CM

## 2020-04-16 NOTE — Unmapped (Signed)
[facility] at [facility]       Type of visit:  face to face    Reason for visit: other and patient stated fell last week  chest/ribs    Questions / Concerns that need to be addressed:     General Consent to Treat (GCT) for non-epic video visits only: Verbal consent      Diabetes:  ??? Regularly checking blood sugars?: no  o If yes, when? Complete log for past 7 days  Date Before Breakfast After Breakfast Before Lunch After Lunch Before Dinner After Dinner Before Bed                                                                                                                                     Hypertension:  ??? Have blood pressure cuff at home?: no  ??? Regularly checking blood pressure?: no  If yes, complete log for past 7 days  Date Time BP Pulse                                                                             Screening BP 149/91 P 87        Omron BPs (complete if screening BP has a systolic  > 139 or diastolic > 89)  BP#1 149/89 P 91   BP#2 153/89 P 90  BP#3 161/90 P 92    Average BP 154/89 P 91  (please note this as a comment in vitals)         Allergies reviewed: Yes    Medication reviewed: Yes  Pended refills? No        HCDM reviewed and updated in Epic:    We are working to make sure all of our patients??? wishes are updated in Epic and part of that is documenting a Environmental health practitioner for each patient  A Health Care Decision Rodena Piety is someone you choose who can make health care decisions for you if you are not able ??? who would you most want to do this for you????  was updated.        BPAs completed:  PHQ9      COVID-19 Vaccine Summary  Which COVID-19 Vaccine was administered  Pfizer  Type:  Dates Given:  12/04/2019                   I    Immunization History   Administered Date(s) Administered   ??? COVID-19 VACC,MRNA,(PFIZER)(PF)(IM) 06/21/2019, 07/12/2019, 12/04/2019   ??? Influenza Vaccine Quad (IIV4 PF) 51mo+ injectable 01/12/2015, 01/13/2016, 01/09/2017, 01/24/2020   ??? Influenza Virus Vaccine, unspecified formulation 01/30/2018,  02/20/2019   ??? PNEUMOCOCCAL POLYSACCHARIDE 23 09/09/2014   ??? Pneumococcal Conjugate 13-Valent 05/31/2019   ??? TdaP 12/13/2019       __________________________________________________________________________________________    SCREENINGS COMPLETED IN FLOWSHEETS    HARK Screening       AUDIT       PHQ2       PHQ9          P4 Suicidality Screener                GAD7       COPD Assessment       Falls Risk       .imcres

## 2020-04-16 NOTE — Unmapped (Addendum)
Julia Avila presented in clinic today for acute visit  We discussed your symptoms and recommend  1) Alternating Tylenol and Ibuprofen. 1g of tylenol every 8 hours. Can do lidocaine patches (no heat, 3 at a time) for local pain control. These can be found over the counter.  2) We will obtain X rays of your chest       If your symptoms worsen please seek additional medical attention and/or follow-up  Thank you it was a pleasure seeing you today.

## 2020-04-16 NOTE — Unmapped (Signed)
INTERNAL MEDICINE ADVANCED SAME DAY CLINIC NOTE     04/16/2020    PCP: Coralee Pesa, MD     Assessment and Plan    Julia Avila was seen today for other.    Diagnoses and all orders for this visit:    Rib pain and Fall  Patient had a fall 1 week ago when she was taking her trash.  She tried to toss her trash bag and tripped over it and then ricocheted herself into a piece of furniture.  She injured multiple parts of her body including her shins bilaterally her abdomen and her chest wall.  She denies having any head trauma.  She continues to have significant tenderness to left anterior rib cage and right lateral rib cage.  She has pain with sleeping and positional pain.  She has difficulty taking deep breaths and having coughing or sneezing spells without significant pain.  Concern for potential fracture. Will rule out with imaging and counsel supportive care. Her blood pressure is slightly elevated likely because of her pain  --Scheduled Tylenol 1 g every 8 hours for pain control.  Can alternate with ibuprofen for inflammation.  Counseled lidocaine patches maximum 3 at a time over painful areas.  --Instructed to reach out if pain is poorly controlled with these interventions  -     XR Ribs Right W PA Chest; Future  -     XR Ribs Left W PA Chest; Future    Follow up as scheduled or sooner as needed.    Patient was seen and discussed with Dr. Barnetta Chapel who is in agreement with the assessment and plan as outlined above.       Subjective    Problem List:  Patient Active Problem List   Diagnosis   ??? Anemia   ??? Type 2 diabetes mellitus with microalbuminuria, with long-term current use of insulin (CMS-HCC)   ??? Depression   ??? Psoriatic arthritis (CMS-HCC)   ??? Bilateral hearing loss   ??? Hypertension associated with diabetes (CMS-HCC)       HPI  Julia Avila is a 54 y.o. year old female with the above problem list who presents to the same day clinic for rib pain  Chief Complaint   Patient presents with   ??? Other     patient stated fell last week  chest/ribs   Last week she fell tripped when tossing a trash bag and fell onto a piece of furniture. Larey Seat and she hit her shins and knees and has some bruisning on her chest wall and then some scratches just below. Has significant pain under right breast and anterior L chest wall. Has been taking 800mg  BID of ibuprofen. She has trouble sleeping because of positional pain. She has significant pain with inspiration, sneezing and coughing in very discrete locations.  She otherwise denies any fevers, chills, night sweats, cough, nausea, vomiting.  She denies having any head trauma or postconcussive type symptoms.    Meds and allergies were reviewed in Epic    ROS: 10 point ROS was performed and is otherwise negative other than mentioned in the HPI    Objective  PE:  Vitals:    04/16/20 1539   BP: 154/89   Pulse: 91   Resp: 18   Temp: 35.5 ??C   SpO2: 97%     General: Mildly distressed in pain  Eyes: EOMI, sclera clear, PERRL  ENT: OP clear w/o erythema or exudate  CV: regular, no murmurs  Resp: Significant pain  to palpation on L anterior chest and R lateral chest wall  GI: soft, NTND, NABS  MSK: Significant pain to palpation on L anterior chest and R lateral chest wall  Skin: ecchymosis notable on chest wall abdomen and bilateral shins  Ext: no cyanosis/clubbing/edema  Neuro: alert, follows commands. CN II-XII grossly intact    Procedure: None  See procedure note from this encounter    _______________________________________________________________________________________      Same Day Metric Tracker:    Referral source for today's visit:  General Internal Medicine    Referral to other outpatient urgent services? N/A    Did today's visit result in referral to ED or direct admission? No    Without today's visit, would this patient have required an ED visit or hospitalization? No

## 2020-04-21 NOTE — Unmapped (Signed)
I saw and evaluated the patient, participating in the key portions of the service.  I reviewed the resident???s note.  I agree with the resident???s findings and plan.     Tobey Bride, MD

## 2020-04-23 DIAGNOSIS — E1129 Type 2 diabetes mellitus with other diabetic kidney complication: Principal | ICD-10-CM

## 2020-04-23 DIAGNOSIS — Z794 Long term (current) use of insulin: Principal | ICD-10-CM

## 2020-04-23 DIAGNOSIS — R809 Proteinuria, unspecified: Principal | ICD-10-CM

## 2020-04-23 MED ORDER — LEVEMIR FLEXTOUCH U-100 INSULIN 100 UNIT/ML (3 ML) SUBCUTANEOUS PEN
0 refills | 0.00000 days
Start: 2020-04-23 — End: ?

## 2020-04-23 NOTE — Unmapped (Signed)
[facility] Refill Coordination Note    Specialty Medication(s) to be Shipped:   Inflammatory Disorders: Cosentyx    Other medication(s) to be shipped: No additional medications requested for fill at this time     ROSANA FARNELL, DOB: 06-Aug-1965  Phone: [phone] (home)       All above HIPAA information was verified with patient.     Was a Nurse, learning disability used for this call? No    Completed refill call assessment today to schedule patient's medication shipment from the [facility] [phone]).       Specialty medication(s) and dose(s) confirmed: Regimen is correct and unchanged.   Changes to medications: Patricie reports no changes at this time.  Changes to insurance: No  Questions for the pharmacist: No    Confirmed patient received Welcome Packet with first shipment. The patient will receive a drug information handout for each medication shipped and additional FDA Medication Guides as required.       DISEASE/MEDICATION-SPECIFIC INFORMATION        For patients on injectable medications: Patient currently has 0 doses left.  Next injection is scheduled for 1/15.    SPECIALTY MEDICATION ADHERENCE     Medication Adherence    Patient reported X missed doses in the last month: 0  Specialty Medication: Cosentyx  Patient is on additional specialty medications: No  Patient is on more than two specialty medications: No  Any gaps in refill history greater than 2 weeks in the last 3 months: no  Demonstrates understanding of importance of adherence: yes  Informant: patient                Cosentyx 150mg /ml: Patient has 0 days of medication on hand       SHIPPING     Shipping address confirmed in Epic.     Delivery Scheduled: Yes, Expected medication delivery date: 1/11.     Medication will be delivered via UPS to the prescription address in Epic W[facility].    Olga Millers   [facility] Specialty Technician

## 2020-04-27 MED ORDER — LEVEMIR FLEXTOUCH U-100 INSULIN 100 UNIT/ML (3 ML) SUBCUTANEOUS PEN
0 refills | 0 days | Status: CP
Start: 2020-04-27 — End: ?

## 2020-04-27 MED FILL — COSENTYX PEN 300 MG/2 PENS (150 MG/ML) SUBCUTANEOUS: SUBCUTANEOUS | 28 days supply | Qty: 2 | Fill #10

## 2020-05-11 ENCOUNTER — Encounter: Admit: 2020-05-11 | Discharge: 2020-05-11 | Payer: PRIVATE HEALTH INSURANCE

## 2020-05-11 ENCOUNTER — Encounter
Admit: 2020-05-11 | Discharge: 2020-05-11 | Payer: PRIVATE HEALTH INSURANCE | Attending: Anesthesiology | Primary: Anesthesiology

## 2020-05-11 DIAGNOSIS — I10 Essential (primary) hypertension: Principal | ICD-10-CM

## 2020-05-11 DIAGNOSIS — Z794 Long term (current) use of insulin: Principal | ICD-10-CM

## 2020-05-11 DIAGNOSIS — K2950 Unspecified chronic gastritis without bleeding: Principal | ICD-10-CM

## 2020-05-11 DIAGNOSIS — E559 Vitamin D deficiency, unspecified: Principal | ICD-10-CM

## 2020-05-11 DIAGNOSIS — E039 Hypothyroidism, unspecified: Principal | ICD-10-CM

## 2020-05-11 DIAGNOSIS — N200 Calculus of kidney: Principal | ICD-10-CM

## 2020-05-11 DIAGNOSIS — L4050 Arthropathic psoriasis, unspecified: Principal | ICD-10-CM

## 2020-05-11 DIAGNOSIS — R197 Diarrhea, unspecified: Principal | ICD-10-CM

## 2020-05-11 DIAGNOSIS — E669 Obesity, unspecified: Principal | ICD-10-CM

## 2020-05-11 DIAGNOSIS — E119 Type 2 diabetes mellitus without complications: Principal | ICD-10-CM

## 2020-05-11 DIAGNOSIS — Z6830 Body mass index (BMI) 30.0-30.9, adult: Principal | ICD-10-CM

## 2020-05-11 DIAGNOSIS — K317 Polyp of stomach and duodenum: Principal | ICD-10-CM

## 2020-05-11 DIAGNOSIS — R110 Nausea: Principal | ICD-10-CM

## 2020-05-11 DIAGNOSIS — K209 Esophagitis, unspecified without bleeding: Principal | ICD-10-CM

## 2020-05-11 MED ORDER — PANTOPRAZOLE 40 MG TABLET,DELAYED RELEASE
ORAL_TABLET | Freq: Every day | ORAL | 6 refills | 30 days | Status: CP
Start: 2020-05-11 — End: 2020-06-10

## 2020-05-11 MED ADMIN — propofoL (DIPRIVAN) injection: INTRAVENOUS | @ 19:00:00 | Stop: 2020-05-11

## 2020-05-11 MED ADMIN — lidocaine (XYLOCAINE) 20 mg/mL (2 %) injection: INTRAVENOUS | @ 19:00:00 | Stop: 2020-05-11

## 2020-05-11 MED ADMIN — sodium chloride (NS) 0.9 % infusion: 10 mL/h | INTRAVENOUS | @ 18:00:00 | Stop: 2020-05-11

## 2020-05-20 NOTE — Unmapped (Signed)
[facility] Clinical Assessment & Refill Coordination Note    Julia Avila, DOB: Oct 19, 1965  Phone: [phone] (home)     All above HIPAA information was verified with patient.     Was a Nurse, learning disability used for this call? No    Specialty Medication(s):   Inflammatory Disorders: Cosentyx     Current Outpatient Medications   Medication Sig Dispense Refill   ??? atorvastatin (LIPITOR) 80 MG tablet Take 1 tablet (80 mg total) by mouth daily. 90 tablet 3   ??? blood sugar diagnostic (GLUCOSE BLOOD) Strp Test blood sugars three times daily. ICD-10 E11.9 100 strip 11   ??? blood-gluc transmitter-sensor Misc 1 each by Miscellaneous route every fourteen (14) days. 6 each 3   ??? blood-glucose meter kit Use as instructed 1 each 0   ??? blood-glucose meter Misc Use to check blood sugars three times daily. ICD10 E11.9 1 each 0   ??? buPROPion (WELLBUTRIN XL) 150 MG 24 hr tablet Take 1 tablet (150 mg total) by mouth every morning. 90 tablet 3   ??? clobetasoL (TEMOVATE) 0.05 % external solution Apply topically Two (2) times a day. 50 mL 0   ??? flash glucose sensor (FREESTYLE LIBRE 14 DAY SENSOR) kit by Other route every fourteen (14) days. Please dispense sensors for Freestyle Libre 14 day System. Use to check blood sugars continuously. E11.29 Z79.4. 6 kit 3   ??? FREESTYLE LIBRE 14 DAY READER USE AS DIRECTED 1 each 11   ??? gabapentin (NEURONTIN) 300 MG capsule Take 1 tablet by mouth twice a day as needed for itching (Patient not taking: Reported on 05/11/2020) 60 capsule 3   ??? inhalational spacing device Spcr Use with inhaler to maximize medication delivery to lungs (Patient not taking: Reported on 04/16/2020) 1 each 0   ??? lancets Misc 1 each by Other route once daily. Test daily before breakfast. Dispense 90 day supply. 100 each 3   ??? LEVEMIR FLEXTOUCH U-100 INSULN 100 unit/mL (3 mL) injection pen INJECT 0.42ML (42 UNITS TOTAL) UNDER THESKIN NIGHTLY 15 mL 0   ??? lisinopriL (PRINIVIL,ZESTRIL) 10 MG tablet Take 1 tablet (10 mg total) by mouth daily. 90 tablet 3   ??? Magic Mouthwash (nystatin/diphenhydramine/mylanta) Oral Mixture Take 5 mL by mouth every six (6) hours as needed. Swish, gargle, spit as needed.  May be swallowed if esophageal involvement. 120 mL 0   ??? mometasone (ELOCON) 0.1 % lotion Apply with cotton ball to ear canals t bilaterally once every 2weeks 60 mL 0   ??? mupirocin (BACTROBAN) 2 % ointment Apply topically to open areas on arms twice daily as needed. 30 g 4   ??? naproxen (EC NAPROSYN) 500 MG EC tablet Take 1 tablet (500 mg total) by mouth two (2) times a day as needed. (Patient not taking: Reported on 04/16/2020) 60 tablet 5   ??? pantoprazole (PROTONIX) 40 MG tablet Take 1 tablet (40 mg total) by mouth daily. 30 tablet 6   ??? pen needle, diabetic (BD ULTRA-FINE NANO PEN NEEDLE) 32 gauge x 5/32 (4 mm) Ndle Needles for insulin and Ozempic pen. Inject daily as instructed. ICD-10 E11.9 100 each 3   ??? secukinumab (COSENTYX) 150 mg/mL PnIj injection Inject the contents of 2 pens (300mg  total) under the skin every 28 days 6 mL 3   ??? semaglutide (OZEMPIC) 1 mg/dose (4 mg/3 mL) PnIj injection Inject 1 mg under the skin every seven (7) days. 3 mL 11     No current  facility-administered medications for this visit.        Changes to medications: Julia Avila reports starting the following medications: Protonix        Allergies   Allergen Reactions   ??? Nitrofurantoin Macrocrystal Rash     Large red welts all over her body   ??? Metformin Diarrhea       Changes to allergies: No    SPECIALTY MEDICATION ADHERENCE     Cosentyx 150 mg/ml: 13 days of medicine on hand       Medication Adherence    Patient reported X missed doses in the last month: 0  Specialty Medication: Cosentyx 150 mg/ml  Patient is on additional specialty medications: No  Informant: patient  Confirmed plan for next specialty medication refill: delivery by pharmacy  Refills needed for supportive medications: not needed          Specialty medication(s) dose(s) confirmed: Regimen is correct and unchanged.     Are there any concerns with adherence? No    Adherence counseling provided? Not needed    CLINICAL MANAGEMENT AND INTERVENTION      Clinical Benefit Assessment:    Do you feel the medicine is effective or helping your condition? Yes    Clinical Benefit counseling provided? Not needed    Adverse Effects Assessment:    Are you experiencing any side effects? No    Are you experiencing difficulty administering your medicine? No    Quality of Life Assessment:    Rheumatology:   Quality of Life    On a scale of 1 ??? 10 with 1 representing not at all and 10 representing completely ??? how has your rheumatologic condition affected your:  Daily pain level?: decline to answer  Ability to complete your regular daily tasks (prepare meals, get dressed, etc.)?: decline to answer  Ability to participate in social or family activities?: decline to answer         Have you discussed this with your provider? Not needed    Therapy Appropriateness:    Is therapy appropriate? Yes, therapy is appropriate and should be continued    DISEASE/MEDICATION-SPECIFIC INFORMATION      For patients on injectable medications: Patient currently has 0 doses left.  Next injection is scheduled for ~06/02/20.    PATIENT SPECIFIC NEEDS     - Does the patient have any physical, cognitive, or cultural barriers? No    - Is the patient high risk? No    - Does the patient require a Care Management Plan? No     - Does the patient require physician intervention or other additional services (i.e. nutrition, smoking cessation, social work)? No      SHIPPING     Specialty Medication(s) to be Shipped:   Inflammatory Disorders: Cosentyx    Other medication(s) to be shipped: No additional medications requested for fill at this time     Changes to insurance: No    Delivery Scheduled: Yes, Expected medication delivery date: 05/28/20.     Medication will be delivered via UPS to the confirmed prescription address in [facility].    The patient will receive a drug information handout for each medication shipped and additional FDA Medication Guides as required.  Verified that patient has previously received a Conservation officer, historic buildings.    All of the patient's questions and concerns have been addressed.    Yani Coventry Vangie Bicker   [facility] Specialty Pharmacist

## 2020-05-27 MED FILL — COSENTYX PEN 300 MG/2 PENS (150 MG/ML) SUBCUTANEOUS: SUBCUTANEOUS | 28 days supply | Qty: 2 | Fill #11

## 2020-06-08 MED ORDER — BD ULTRA-FINE MINI PEN NEEDLE 31 GAUGE X 3/16" (5 MM)
0 refills | 0 days
Start: 2020-06-08 — End: ?

## 2020-06-09 MED ORDER — BD ULTRA-FINE MINI PEN NEEDLE 31 GAUGE X 3/16" (5 MM)
3 refills | 0 days | Status: CP
Start: 2020-06-09 — End: ?

## 2020-06-11 ENCOUNTER — Encounter: Admit: 2020-06-11 | Discharge: 2020-06-12 | Payer: PRIVATE HEALTH INSURANCE

## 2020-06-12 NOTE — Unmapped (Signed)
Patient Name: Julia Avila  PCP: ??Coralee Pesa, MD  Source of History: patient and records  Date of Visit: 06/11/20 4:40 PM    Identification: Pt self identified using name and date of birth  Patient location: [phone], [facility] home  The limitations of this telemedicine encounter were discussed with patient. Both the patient and myself agreed to this encounter despite these limitations. Benefits of this telemedicine encounter included allowing for continued care of patient and minimizing risk of exposure to COVID-19. Patient also aware that this is a billable encounter with possible copay.     Chief Compliant: follow-up for Psoriatic Arthritis    PRIOR RHEUMATOLOGIC HISTORY:?? hx of psoriasis (since childhood) and psoriatic arthritis. Has had peripheral and axial involvement.   Treatment hx:   - mtx w/o relief  - Enbrel with good results for 9 years, then loss of efficacy  - Enbrel changed to humira in 2015, gaps in therapy due to recurrent UTIs, found to have nephrolithiasis s/p lithotripsy and ureteral dilation.   H[facility] [place] w/o efficacy  - Simponi w/o efficacy  - Cosentyx since 2016.   - Current med regimen: cosentyx 300 mg q 28 days monotherapy     HPI: Julia Avila is a 55 y.o. female who presents for her follow-up for psoriatic arthritis. She was last seen by me in March 2020 which is when I first met her. Prior patient of Dr. Cecilio Asper. Has had televisits since the pandemic but did connect in person with [facility] in October 2021. She presents today for a video visit via [facility]. Sent her several text messages but she did not connect. Given this, called her at 4:43 pm and she said she did not receive the links to connect virtually. Advised her to check and she says she did get message now. She will try to connect by video and did successfully.    Today, patient reports that she is compliant with her Cosentyx. She notes stiffness and tenderness in her hands since the weather has been colder. She reports symptoms are not significant enough for her to require pain medications. She reports psoriasis had a small beginning of a flare in her scalp but resumed Tar Shampoo therapy and this has cleared it. She reports hand stiffness is minimal. She reports morning stiffness resolves within ten minutes. She feels like Cosentyx is still working for her. Advised her to get a 4th COVID-19 vaccine shot as last booster was in August 2021.     ROS??:  Attests to the above, otherwise all other systems are negative. ????  ??  Past Medical and Surgical History:  ??  Patient Active Problem List    Diagnosis Date Noted   ??? Hypertension associated with diabetes (CMS-HCC) 01/13/2016   ??? Bilateral hearing loss 05/01/2015   ??? Psoriatic arthritis (CMS-HCC) 11/05/2014   ??? Type 2 diabetes mellitus with microalbuminuria, with long-term current use of insulin (CMS-HCC) 09/09/2014   ??? Depression 09/09/2014   ??? Anemia 08/08/2014     Past Surgical History:   Procedure Laterality Date   ??? CESAREAN SECTION     ??? LIPOSUCTION     ??? PR COLONOSCOPY FLX DX W/COLLJ SPEC WHEN PFRMD N/A 08/11/2014    Procedure: COLONOSCOPY, FLEXIBLE, PROXIMAL TO SPLENIC FLEXURE; DIAGNOSTIC, W/WO COLLECTION SPECIMEN BY BRUSH OR WASH;  Surgeon: Tish Men, MD;  [facility];  Service: Gastroenterology   ??? PR COLONOSCOPY W/BIOPSY SINGLE/MULTIPLE N/A 05/11/2020    Procedure: COLONOSCOPY, FLEXIBLE,  PROXIMAL TO SPLENIC FLEXURE; WITH BIOPSY, SINGLE OR MULTIPLE;  Surgeon: Jarvis Morgan, MD;  Location: [facility];  Service: Gastroenterology   ??? PR UPPER GI ENDOSCOPY,BIOPSY N/A 05/11/2020    Procedure: UGI ENDOSCOPY; WITH BIOPSY, SINGLE OR MULTIPLE;  Surgeon: Jarvis Morgan, MD;  Location: [facility];  Service: Gastroenterology   ??? TYMPANOSTOMY TUBE PLACEMENT     ??? WISDOM TOOTH EXTRACTION      10 years ago     Allergies:   ??  Allergies   Allergen Reactions   ??? Nitrofurantoin Macrocrystal Rash     Large red welts all over her body   ??? Metformin Diarrhea     Current Outpatient Medications:  ??  Current Outpatient Medications on File Prior to Visit   Medication Sig Dispense Refill   ??? atorvastatin (LIPITOR) 80 MG tablet Take 1 tablet (80 mg total) by mouth daily. 90 tablet 3   ??? BD ULTRA-FINE MINI PEN NEEDLE 31 gauge x 3/16 (5 mm) Ndle USE 3 TIMES DAILY 100 each 3   ??? blood sugar diagnostic (GLUCOSE BLOOD) Strp Test blood sugars three times daily. ICD-10 E11.9 100 strip 11   ??? blood-gluc transmitter-sensor Misc 1 each by Miscellaneous route every fourteen (14) days. 6 each 3   ??? blood-glucose meter kit Use as instructed 1 each 0   ??? blood-glucose meter Misc Use to check blood sugars three times daily. ICD10 E11.9 1 each 0   ??? buPROPion (WELLBUTRIN XL) 150 MG 24 hr tablet Take 1 tablet (150 mg total) by mouth every morning. 90 tablet 3   ??? clobetasoL (TEMOVATE) 0.05 % external solution Apply topically Two (2) times a day. 50 mL 0   ??? flash glucose sensor (FREESTYLE LIBRE 14 DAY SENSOR) kit by Other route every fourteen (14) days. Please dispense sensors for Freestyle Libre 14 day System. Use to check blood sugars continuously. E11.29 Z79.4. 6 kit 3   ??? FREESTYLE LIBRE 14 DAY READER USE AS DIRECTED 1 each 11   ??? gabapentin (NEURONTIN) 300 MG capsule Take 1 tablet by mouth twice a day as needed for itching (Patient not taking: Reported on 05/11/2020) 60 capsule 3   ??? inhalational spacing device Spcr Use with inhaler to maximize medication delivery to lungs (Patient not taking: Reported on 04/16/2020) 1 each 0   ??? lancets Misc 1 each by Other route once daily. Test daily before breakfast. Dispense 90 day supply. 100 each 3   ??? LEVEMIR FLEXTOUCH U-100 INSULN 100 unit/mL (3 mL) injection pen INJECT 0.42ML (42 UNITS TOTAL) UNDER THESKIN NIGHTLY 15 mL 0   ??? lisinopriL (PRINIVIL,ZESTRIL) 10 MG tablet Take 1 tablet (10 mg total) by mouth daily. 90 tablet 3   ??? Magic Mouthwash (nystatin/diphenhydramine/mylanta) Oral Mixture Take 5 mL by mouth every six (6) hours as needed. Swish, gargle, spit as needed.  May be swallowed if esophageal involvement. 120 mL 0   ??? mometasone (ELOCON) 0.1 % lotion Apply with cotton ball to ear canals t bilaterally once every 2weeks 60 mL 0   ??? mupirocin (BACTROBAN) 2 % ointment Apply topically to open areas on arms twice daily as needed. 30 g 4   ??? naproxen (EC NAPROSYN) 500 MG EC tablet Take 1 tablet (500 mg total) by mouth two (2) times a day as needed. (Patient not taking: Reported on 04/16/2020) 60 tablet 5   ??? [EXPIRED] pantoprazole (PROTONIX) 40 MG tablet Take 1 tablet (40 mg total) by  mouth daily. 30 tablet 6   ??? pen needle, diabetic (BD ULTRA-FINE NANO PEN NEEDLE) 32 gauge x 5/32 (4 mm) Ndle Needles for insulin and Ozempic pen. Inject daily as instructed. ICD-10 E11.9 100 each 3   ??? secukinumab (COSENTYX) 150 mg/mL PnIj injection Inject the contents of 2 pens (300mg  total) under the skin every 28 days 6 mL 3   ??? semaglutide (OZEMPIC) 1 mg/dose (4 mg/3 mL) PnIj injection Inject 1 mg under the skin every seven (7) days. 3 mL 11     No current facility-administered medications on file prior to visit.     ??  Immunization History   Administered Date(s) Administered   ??? COVID-19 VACC,MRNA,(PFIZER)(PF)(IM) 06/21/2019, 07/12/2019, 12/04/2019   ??? Influenza Vaccine Quad (IIV4 PF) 88mo+ injectable 01/12/2015, 01/13/2016, 01/09/2017, 01/24/2020   ??? Influenza Virus Vaccine, unspecified formulation 01/30/2018, 02/20/2019   ??? PNEUMOCOCCAL POLYSACCHARIDE 23 09/09/2014   ??? Pneumococcal Conjugate 13-Valent 05/31/2019   ??? TdaP 12/13/2019     ????  PHYSICAL EXAM?? - limited over video  Vital signs: LMP  (LMP Unknown) There is no height or weight on file to calculate BMI.  Gen: Well-developed, well-nourished adult in no apparent distress. Normocephalic with no external signs of trauma. Pleasant and cooperative. AOx4.?  HEENT: Face symmetric  Lungs: Talking in full sentences. NO coughing or wheezing  LImited Musculoskeletal Examination:????  ?? Jaw, neck without limited ROM.????  ?? Shoulders, elbows, wrists, hands, fingers:  No deformity, swelling or limited ROM.??  Skin: No rash noted.??     LABORATORY - reviewed in epic. Last labs in 01/2020 did not include LFTs. CBC and kidney wnl.    ????  ????GENERAL SUMMARY AND IMPRESSION: ????  ????  In summary, the patient is a 55 y.o. female with psoriatic arthritis on Cosentyx 300 mg once a month monotherapy. Per patient report, psoriasis and psoriatic arthritis under good control on regimen. Review of labs over past year notes LFTs are needed and ordered for her to complete at her convenience. Reviewed immunization. Recommend she get COVID-19 vaccine #4 shot and she is also candidate for Evusheld. Discussed Evusheld cannot be given earlier than two weeks after covid-19 vaccine shot. She will consider. Will provider literature to review. Follow-up in 5 months with [facility] and 10 months with myself.       RECOMMENDATIONS: ????  ????   Diagnosis ICD-10-CM Associated Orders   1. Psoriatic arthritis (CMS-HCC)  L40.50 Hepatic Function Panel   2. High risk medication use  Z79.899 Hepatic Function Panel     Patient Instructions     Recommend you get #4 COVID-19 vaccine shot.    You are candidate for Evusheld under FDA emergency use action. Medication is still being studied under clinical trial. Evusheld cannot be given any sooner than two weeks after vaccine shot.      Patient Education     Fact Sheet for Patients, Parents And Caregivers   Emergency Use Authorization (EUA) of EVUSHELD???   (tixagevimab co-packaged with cilgavimab)   for Coronavirus Disease 2019 (COVID-19)    You are being given this Fact Sheet because your healthcare provider believes it is necessary to provide you with EVUSHELD (tixagevimab co-packaged with cilgavimab) for pre-exposure prophylaxis for prevention of coronavirus disease 2019 (COVID-19) caused by the SARS-CoV-2 virus.    This Fact Sheet contains information to help you understand the potential risks and potential benefits of taking EVUSHELD, which you have received or may receive.    The U.S. Food and  Drug Administration (FDA) has issued an Emergency Use Authorization (EUA) to make EVUSHELD available during the COVID-19 pandemic (for more details about an EUA please see What is an Emergency Use Authorization? at the end of this document). EVUSHELD is not an FDA-approved medicine in the [place].    Read this Fact Sheet for information about EVUSHELD. Talk to your healthcare provider if you have any questions. It is your choice to receive or not receive EVUSHELD.    What is COVID-19?  COVID-19 is caused by a virus called a coronavirus. You can get COVID-19 through close contact with another person who has the virus.    COVID-19 illnesses have ranged from very mild (including some with no reported symptoms) to severe, including illness resulting in death. While information so far suggests that most COVID-19 illness is mild, serious illness can happen and may cause some of your other medical conditions to become worse. Older people and people of all ages with severe, long-lasting (chronic) medical conditions like heart disease, lung disease, and diabetes, for example, seem to be at higher risk of being hospitalized for COVID-19.    What is EVUSHELD (tixagevimab co-packaged with cilgavimab)?  EVUSHELD is an investigational medicine used in adults and adolescents (77 years of age and older who weigh at least 88 pounds [40 kg]) for pre-exposure prophylaxis for prevention of COVID-19 in persons who are:  ?? not currently infected with SARS-CoV-2 and who have not had recent known close contact with someone who is infected with SARS-CoV-2 and  ?? Who have moderate to severe immune compromise due to a medical condition or have received immunosuppressive medicines or treatments and may not mount an adequate immune response to COVID-19 vaccination or  ?? For whom vaccination with any available COVID-19 vaccine, according to the approved or authorized schedule, is not recommended due to a history of severe adverse reaction (such as severe allergic reaction) to a COVID-19 vaccine(s) or COVID-19 vaccine ingredient(s).    EVUSHELD is investigational because it is still being studied. There is limited information known about the safety and effectiveness of using EVUSHELD for pre-exposure prophylaxis for prevention of COVID-19. EVUSHELD is not authorized for post-exposure prophylaxis for prevention of COVID-19.    The FDA has authorized the emergency use of EVUSHELD for pre-exposure prophylaxis for prevention of COVID-19 under an Emergency Use Authorization (EUA).    What should I tell my healthcare provider before I receive EVUSHELD?  Tell your healthcare provider if you:  ?? Have any allergies  ?? Have low numbers of blood platelets (which help blood clotting), a bleeding disorder, or are taking anticoagulants (to prevent blood clots)  ?? Have had a heart attack or stroke, have other heart problems, or are at high-risk of cardiac (heart) events  ?? Are pregnant or plan to become pregnant  ?? Are breastfeeding a child  ?? Have any serious illness  ?? Are taking any medications (prescription, over-the-counter, vitamins, or herbal products)     How will I receive EVUSHELD?  ?? EVUSHELD consists of two investigational medicines, tixagevimab and cilgavimab.  ?? You will receive 1 dose of EVUSHELD, consisting of 2 separate injections (tixagevimab and cilgavimab).  ?? EVUSHELD will be given to you by your healthcare provider as 2 intramuscular injections. They are usually, given one after the other, 1 into each of your buttocks.    After the initial dose, if your healthcare provider determines that you need to receive additional doses of EVUSHELD for ongoing protection, the additional  doses would be administered once every 6 months.    Who should generally not take EVUSHELD?  Do not take EVUSHELD if you have had a severe allergic reaction to EVUSHELD or any ingredient in EVUSHELD.    What are the important possible side effects of EVUSHELD?  Possible side effects of EVUSHELD are:  ?? Allergic reactions. Allergic reactions can happen during and after injection of EVUSHELD. Tell your healthcare provider right away if you get any of the following signs and symptoms of allergic reactions: fever, chills, nausea, headache, shortness of breath, low or high blood pressure, rapid or slow heart rate, chest discomfort or pain, weakness, confusion, feeling tired, wheezing, swelling of your lips, face, or throat, rash including hives, itching, muscle aches, dizziness and sweating. These reactions may be severe or life threatening.  ?? Cardiac (heart) events: Serious cardiac adverse events have happened, but were not common, in people who received EVUSHELD and also in people who did not receive EVUSHELD in the clinical trial studying pre-exposure prophylaxis for prevention of COVID-19. In people with risk factors for cardiac events (including a history of heart attack), more people who received EVUSHELD experienced serious cardiac events than people who did not receive EVUSHELD. It is not known if these events are related to [facility] or underlying medical conditions. Contact your healthcare provider or get medical help right away if you get any symptoms of cardiac events, including pain, pressure, or discomfort in the chest, arms, neck, back, stomach or jaw, as well as shortness of breath, feeling tired or weak (fatigue), feeling sick (nausea), or swelling in your ankles or lower legs.    The side effects of getting any medicine by intramuscular injection may include pain, bruising of the skin, soreness, swelling, and possible bleeding or infection at the injection site.    These are not all the possible side effects of EVUSHELD. Not a lot of people have been given EVUSHELD. Serious and unexpected side effects may happen. EVUSHELD is still being studied so it is possible that all of the risks are not known at this time.    It is possible that EVUSHELD may reduce your body's immune response to a COVID-19 vaccine. If you have received a COVID-19 vaccine, you should wait to receive EVUSHELD until at least 2 weeks after COVID-19 vaccination.    What other prevention choices are there?  Vaccines to prevent COVID-19 are approved or available under Emergency Use Authorization. Use of EVUSHELD does not replace vaccination against COVID-19. For more information about other medicines authorized for treatment or prevention of COVID-19 go to [email or web address] for more information.    It is your choice to receive or not receive EVUSHELD. Should you decide not to receive EVUSHELD, it will not change your standard medical care.    EVUSHELD is not authorized for post-exposure prophylaxis of COVID-19.    What if I am pregnant or breastfeeding?  If you are pregnant or breastfeeding, discuss your options and specific situation with your healthcare provider.    How do I report side effects with EVUSHELD?  Contact your healthcare provider if you have any side effects that bother you or do not go away. Report side effects to FDA MedWatch at MacRetreat.be or call 1-800-FDA-1088 or call [employer] at 810-135-6786.    Additional Information  If you have questions, visit the website or call the telephone number provided below.    Website  Telephone number    http://www.evusheld.com  [phone]      How can  I learn more about COVID-19?  ?? Ask your healthcare provider.  ?? Visit: [email or web address]    ?? Contact your local or state public health department.    What is an Emergency Use Authorization?  The [place] FDA has made EVUSHELD (tixagevimab co-packaged with cilgavimab) available under an emergency access mechanism called an Emergency Use Authorization EUA. The EUA is supported by a Surveyor, minerals and Human Service (HHS) declaration that circumstances exist to justify the emergency use of drugs and biological products during the COVID-19 pandemic.    EVUSHELD for pre-exposure prophylaxis for prevention of coronavirus disease 2019 (COVID-19) caused by the SARS-CoV-2 virus has not undergone the same type of review as an FDA-approved product. In issuing an EUA under the COVID-19 public health emergency, the FDA has determined, among other things, that based on the total amount of scientific evidence available including data from adequate and well-controlled clinical trials, if available, it is reasonable to believe that the product may be effective for diagnosing, treating, or preventing COVID-19, or a serious or life-threatening disease or condition caused by COVID-19; that the known and potential benefits of the product, when used to diagnose, treat, or prevent such disease or condition, outweigh the known and potential risks of such product; and that there are no adequate, approved and available alternatives.    All of these criteria must be met to allow for the product to be used in the treatment of patients during the COVID-19 pandemic. The EUA for EVUSHELD is in effect for the duration of the COVID-19 declaration justifying emergency use of EVUSHELD,  unless terminated or revoked (after which EVUSHELD may no longer be used under the EUA).    Distributed by: [employer], [place], [place] [address]    Manufactured by: [employer], [address], [place], [place] [address], [place]    ??[employer] 2021. All rights reserved.     Patient Education   Frequently Asked Questions on the Emergency Use Authorization for Evusheld (tixagevimab co-packaged with cilgavimab) for Pre-exposure Prophylaxis (PrEP) of COVID-19     Q. What is an Emergency Use Authorization (EUA)?   A: Under section 564 of the [employer], Drug & Cosmetic Act, after a declaration by the [facility] Secretary based on one of four types of determinations, FDA may authorize an unapproved product or unapproved uses of an approved product for emergency use. In issuing an EUA, FDA must determine, among other things, that based on the totality of scientific evidence available to the Agency, including data from adequate and well-controlled clinical trials, if available, it is reasonable to believe that the product may be effective in diagnosing, treating, or preventing a serious or life-threatening disease or condition caused by a chemical, biological, radiological, or nuclear agent; that the known and potential benefits, when used to treat, diagnose or prevent such disease or condition, outweigh the known and potential risks for the product; and that there are no adequate, approved, and available alternatives. Emergency use authorization is NOT the same as FDA approval or licensure.     Q. What does this EUA authorize? What are the limitations of authorized use?   A: The EUA authorizes [employer]???s Evusheld (tixagevimab co-packaged with cilgavimab) for emergency use as pre-exposure prophylaxis for prevention of COVID-19 in adults and pediatric individuals (42 years of age and older weighing at least 40 kg):     ??? Who are not currently infected with SARS-CoV-2 and who have not had a known recent exposure to an individual infected  with SARS-CoV-2 and  o Who have moderate to severe immune compromise due to a medical condition or receipt of immunosuppressive medications or treatments and may not mount an adequate immune response to COVID-19 vaccination or  o For whom vaccination with any available COVID-19 vaccine, according to the approved or authorized schedule, is not recommended due to a history of severe adverse reaction (e.g., severe allergic reaction) to a COVID-19 vaccine(s) and/or COVID-19 vaccine component(s).     Limitations of Authorized Use     ??? Evusheld is not authorized for use in individuals:   o For treatment of COVID-19, or   o For post-exposure prophylaxis of COVID-19 in individuals who have been exposed to someone infected with SARS-CoV-2.   ??? Pre-exposure prophylaxis with Evusheld is not a substitute for vaccination in individuals for whom COVID-19 vaccination is recommended. Individuals for whom COVID-19 vaccination is recommended, including individuals with moderate to severe immune compromise who may derive benefit from COVID-19 vaccination, should receive COVID-19 vaccination.   ??? In individuals who have received a COVID-19 vaccine, Evusheld should be administered at least two weeks after vaccination.     Q. What are some medical conditions or treatments that may lead to an inadequate immune response to the COVID-19 vaccination?   A: Medical conditions or treatments that may result in moderate to severe immunocompromise and an inadequate immune response to COVID-19 vaccination include but are not limited to:   ??? Active treatment for solid tumor and hematologic malignancies   ??? Receipt of solid-organ transplant and taking immunosuppressive therapy   ??? Receipt of chimeric antigen receptor (CAR)-T-cell or hematopoietic stem cell transplant (within 2 years of transplantation or taking immunosuppression therapy)   ??? Moderate or severe primary immunodeficiency (e.g., DiGeorge syndrome, Wiskott-Aldrich syndrome)   ??? Advanced or untreated HIV infection (people with HIV and CD4 cell counts)  ??? Active treatment with high-dose corticosteroids (i.e., ?20 mg prednisone or equivalent per day when administered for ?2 weeks), alkylating agents, antimetabolites, transplant-related immunosuppressive drugs, cancer chemotherapeutic agents classified as severely immunosuppressive, tumor-necrosis (TNF) blockers, and other biologic agents that are immunosuppressive or immunomodulatory (e.g., B-cell depleting agents)     For additional information, refer to the [facility] Vaccines & Immunizations website.     Q. Is Evusheld approved by the FDA to prevent or treat COVID-19?   A. No. Evusheld is not FDA-approved to prevent or treat any diseases or conditions, including COVID-19. Evusheld is an investigational drug.     Q. How can Evusheld be obtained for use under the EUA?   A. For questions on how to obtain Evusheld, please contact [email or web address] [email or web address].     Q. Who may prescribe Evusheld under the EUA?   A. Evusheld may only be prescribed for an individual patient by physicians, advanced practice registered nurses, and physician assistants that are licensed or authorized under state law to prescribe drugs in the therapeutic class to which Evusheld belongs (i.e., anti-infectives).     Q. Are tixagevimab and cilgavimab monoclonal antibodies? What is a monoclonal antibody?   A. Yes, tixagevimab and cilgavimab are monoclonal antibodies. Monoclonal antibodies are laboratory-produced molecules engineered to serve as substitute antibodies that can restore, enhance or mimic the immune system's attack on pathogens. Evusheld is designed to block viral attachment and entry into human cells, thus neutralizing the virus.     Q. When should Evusheld be administered to a patient?   A. Patients should talk to their health care provider to determine whether, based on their individual circumstances,  they are eligible to receive Evusheld, and when it should be administered. More information about administration is available in the Fact Sheet for Health Care Providers.     Q. Are there potential side effects of Evusheld?   A. Possible side effects of Evusheld include the following:     Allergic reactions can happen during and after injection of Evusheld. Reactions to Evusheld may include difficulty breathing or swallowing; shortness of breath; wheezing; swelling of the face, lips, tongue or throat; rash including hives; or itching.     The side effects of getting any medicine by intramuscular injection may include pain, bruising of the skin, soreness, swelling, and possible bleeding or infection at the injection site.     Serious cardiac adverse events (such as myocardial infarction and heart failure) were infrequent in the clinical trial evaluating Evusheld for pre-exposure prophylaxis for prevention. However, more trial participants had serious cardiac adverse events after receiving Evusheld compared to placebo. These participants all had risk factors for cardiac disease or a history of cardiovascular disease before participating in the clinical trial. It is not clear if Evusheld caused these cardiac adverse events.     These are not all the possible side effects of Evusheld. Not a lot of people have been given Evusheld. Serious and unexpected side effects may happen. Evusheld is still being studied so it is possible that all of the risks are not known at this time.     Q. Are there reporting requirements for health care facilities and providers as part of the EUA?   A. Yes. As part of the EUA, FDA requires health care providers who prescribe Evusheld to report all medication errors and serious adverse events considered to be potentially related to Evusheld through FDA???s MedWatch Adverse Event Reporting program.     Health care facilities and providers must report therapeutics information and utilization data as directed by the U.S. Department of Health and [employer].     Q. Do patient outcomes need to be reported under the EUA?   A. No, reporting of patient outcomes is not required under the EUA. However, reporting of all medication errors and serious adverse events considered to be potentially related to Evusheld occurring during treatment is required.     Q. FDA has issued a number of EUAs including for therapeutics. If state laws impose different or additional requirements on the medical product covered by an EUA, are those state laws preempted?   A. As stated in FDA???s Emergency Use Authorization of Medical Products and Related Authorities Guidance, ???FDA believes that the terms and conditions of an EUA issued under section 564 preempt state or local law, both legislative requirements and common-law duties, that impose different or additional requirements on the medical product for which the EUA was issued in the context of the emergency declared under section 564.??? The guidance explains the basis for FDA???s views on this subject.     Q. Can health care providers share the patient/caregiver Fact Sheet electronically?   A. The letter of authorization for Evusheld, requires that Fact Sheets be made available to health care providers and to patients/caregivers ???through appropriate means.??? Electronic delivery of the patient/caregiver Fact Sheet is an appropriate means. For example, when the patient requests the Fact       Sheet electronically, it can be delivered as a PDF prior to medication administration. Health care providers should confirm receipt of the Fact Sheet with the patient.     Q. Can I receive a  COVID-19 vaccine if I was treated with a monoclonal antibody for COVID-19?   A. Patients and health care providers should refer to recommendations of the Advisory Committee on Immunization Practices regarding vaccination.     Q. Can I receive Evusheld if I recently received a COVID-19 vaccine?   A. Evusheld may reduce your body???s immune response to a COVID-19 vaccine. If you receive a COVID-19 vaccine, you should wait to receive Evusheld until at least two weeks after your COVID-19 vaccination.     Q. Are there data showing Evusheld may provide benefit for pre-exposure prophylaxis for prevention of COVID-19 in certain patients?   A. Yes. The most important scientific evidence supporting the authorization of Evusheld is from [employer], a randomized, double-blind, placebo-controlled clinical trial in adults who had not received a COVID-19 vaccine and did not have a history of SARS-CoV-2 infection or test positive for SARS-CoV-2 infection at the start of the trial. All trial participants were either ?53 years of age, had a pre-specified co-morbidity (obesity, congestive heart failure, chronic obstructive pulmonary disease, chronic kidney disease, chronic liver disease, immunocompromised state, or previous history of severe or serious adverse event after receiving any approved vaccine), or were at increased risk of SARS-CoV-2 infection due to their living situation or occupation.     The main outcome measured in the trial was whether the trial participant had a case of documented COVID-19 after receiving Evusheld or placebo and before day 183 of the trial. In this trial, 3,441 people received Evusheld and 1,731 received a placebo. In the primary analysis, Evusheld recipients saw a 77% reduced risk of developing COVID-19 compared to those who received a placebo, a statistically significant difference. In additional analyses, the reduction in risk of developing COVID-19 was maintained for Evusheld recipients through six months. The safety and effectiveness of this investigational therapy for use in the pre-exposure prevention of COVID-19 continue to be evaluated.        The patient indicates understanding of these issues and agrees to the plan as outlined above.  Contact information provided for any concerns or questions in the interim.  ??      The patient reports they are currently: at home. I spent 12 minutes on the real-time audio and video visit with the patient on the date of service. I spent an additional 10 minutes on pre- and post-visit activities on the date of service.     The patient was not located and I was located within 250 yards of a hospital based location during the real-time audio and video visit. The patient was physically located in [place] or a state in which I am permitted to provide care. The patient and/or parent/guardian understood that s/he may incur co-pays and cost sharing, and agreed to the telemedicine visit. The visit was reasonable and appropriate under the circumstances given the patient's presentation at the time.    The patient and/or parent/guardian has been advised of the potential risks and limitations of this mode of treatment (including, but not limited to, the absence of in-person examination) and has agreed to be treated using telemedicine. The patient's/patient's family's questions regarding telemedicine have been answered.    If the visit was completed in an ambulatory setting, the patient and/or parent/guardian has also been advised to contact their provider???s office for worsening conditions, and seek emergency medical treatment and/or call 911 if the patient deems either necessary.    Smith Potenza C. Scarlette Calico, MD, PhD  Assistant Professor of Medicine  Department of Medicine/Division  of Rheumatology  [facility]  4:58 PM

## 2020-06-12 NOTE — Unmapped (Addendum)
Recommend you get #4 COVID-19 vaccine shot.    You are candidate for Evusheld under FDA emergency use action. Medication is still being studied under clinical trial. Evusheld cannot be given any sooner than two weeks after vaccine shot.      Patient Education     Fact Sheet for Patients, Parents And Caregivers   Emergency Use Authorization (EUA) of EVUSHELD???   (tixagevimab co-packaged with cilgavimab)   for Coronavirus Disease 2019 (COVID-19)    You are being given this Fact Sheet because your healthcare provider believes it is necessary to provide you with EVUSHELD (tixagevimab co-packaged with cilgavimab) for pre-exposure prophylaxis for prevention of coronavirus disease 2019 (COVID-19) caused by the SARS-CoV-2 virus.    This Fact Sheet contains information to help you understand the potential risks and potential benefits of taking EVUSHELD, which you have received or may receive.    The U.S. Food and Drug Administration (FDA) has issued an Emergency Use Authorization (EUA) to make EVUSHELD available during the COVID-19 pandemic (for more details about an EUA please see What is an Emergency Use Authorization? at the end of this document). EVUSHELD is not an FDA-approved medicine in the [place].    Read this Fact Sheet for information about EVUSHELD. Talk to your healthcare provider if you have any questions. It is your choice to receive or not receive EVUSHELD.    What is COVID-19?  COVID-19 is caused by a virus called a coronavirus. You can get COVID-19 through close contact with another person who has the virus.    COVID-19 illnesses have ranged from very mild (including some with no reported symptoms) to severe, including illness resulting in death. While information so far suggests that most COVID-19 illness is mild, serious illness can happen and may cause some of your other medical conditions to become worse. Older people and people of all ages with severe, long-lasting (chronic) medical conditions like heart disease, lung disease, and diabetes, for example, seem to be at higher risk of being hospitalized for COVID-19.    What is EVUSHELD (tixagevimab co-packaged with cilgavimab)?  EVUSHELD is an investigational medicine used in adults and adolescents (53 years of age and older who weigh at least 88 pounds [40 kg]) for pre-exposure prophylaxis for prevention of COVID-19 in persons who are:  ?? not currently infected with SARS-CoV-2 and who have not had recent known close contact with someone who is infected with SARS-CoV-2 and  ?? Who have moderate to severe immune compromise due to a medical condition or have received immunosuppressive medicines or treatments and may not mount an adequate immune response to COVID-19 vaccination or  ?? For whom vaccination with any available COVID-19 vaccine, according to the approved or authorized schedule, is not recommended due to a history of severe adverse reaction (such as severe allergic reaction) to a COVID-19 vaccine(s) or COVID-19 vaccine ingredient(s).    EVUSHELD is investigational because it is still being studied. There is limited information known about the safety and effectiveness of using EVUSHELD for pre-exposure prophylaxis for prevention of COVID-19. EVUSHELD is not authorized for post-exposure prophylaxis for prevention of COVID-19.    The FDA has authorized the emergency use of EVUSHELD for pre-exposure prophylaxis for prevention of COVID-19 under an Emergency Use Authorization (EUA).    What should I tell my healthcare provider before I receive EVUSHELD?  Tell your healthcare provider if you:  ?? Have any allergies  ?? Have low numbers of blood platelets (which help blood clotting), a bleeding  disorder, or are taking anticoagulants (to prevent blood clots)  ?? Have had a heart attack or stroke, have other heart problems, or are at high-risk of cardiac (heart) events  ?? Are pregnant or plan to become pregnant  ?? Are breastfeeding a child  ?? Have any serious illness  ?? Are taking any medications (prescription, over-the-counter, vitamins, or herbal products)     How will I receive EVUSHELD?  ?? EVUSHELD consists of two investigational medicines, tixagevimab and cilgavimab.  ?? You will receive 1 dose of EVUSHELD, consisting of 2 separate injections (tixagevimab and cilgavimab).  ?? EVUSHELD will be given to you by your healthcare provider as 2 intramuscular injections. They are usually, given one after the other, 1 into each of your buttocks.    After the initial dose, if your healthcare provider determines that you need to receive additional doses of EVUSHELD for ongoing protection, the additional doses would be administered once every 6 months.    Who should generally not take EVUSHELD?  Do not take EVUSHELD if you have had a severe allergic reaction to EVUSHELD or any ingredient in EVUSHELD.    What are the important possible side effects of EVUSHELD?  Possible side effects of EVUSHELD are:  ?? Allergic reactions. Allergic reactions can happen during and after injection of EVUSHELD. Tell your healthcare provider right away if you get any of the following signs and symptoms of allergic reactions: fever, chills, nausea, headache, shortness of breath, low or high blood pressure, rapid or slow heart rate, chest discomfort or pain, weakness, confusion, feeling tired, wheezing, swelling of your lips, face, or throat, rash including hives, itching, muscle aches, dizziness and sweating. These reactions may be severe or life threatening.  ?? Cardiac (heart) events: Serious cardiac adverse events have happened, but were not common, in people who received EVUSHELD and also in people who did not receive EVUSHELD in the clinical trial studying pre-exposure prophylaxis for prevention of COVID-19. In people with risk factors for cardiac events (including a history of heart attack), more people who received EVUSHELD experienced serious cardiac events than people who did not receive EVUSHELD. It is not known if these events are related to [facility] or underlying medical conditions. Contact your healthcare provider or get medical help right away if you get any symptoms of cardiac events, including pain, pressure, or discomfort in the chest, arms, neck, back, stomach or jaw, as well as shortness of breath, feeling tired or weak (fatigue), feeling sick (nausea), or swelling in your ankles or lower legs.    The side effects of getting any medicine by intramuscular injection may include pain, bruising of the skin, soreness, swelling, and possible bleeding or infection at the injection site.    These are not all the possible side effects of EVUSHELD. Not a lot of people have been given EVUSHELD. Serious and unexpected side effects may happen. EVUSHELD is still being studied so it is possible that all of the risks are not known at this time.    It is possible that EVUSHELD may reduce your body's immune response to a COVID-19 vaccine. If you have received a COVID-19 vaccine, you should wait to receive EVUSHELD until at least 2 weeks after COVID-19 vaccination.    What other prevention choices are there?  Vaccines to prevent COVID-19 are approved or available under Emergency Use Authorization. Use of EVUSHELD does not replace vaccination against COVID-19. For more information about other medicines authorized for treatment or prevention of COVID-19 go to [email or web address]  for more information.    It is your choice to receive or not receive EVUSHELD. Should you decide not to receive EVUSHELD, it will not change your standard medical care.    EVUSHELD is not authorized for post-exposure prophylaxis of COVID-19.    What if I am pregnant or breastfeeding?  If you are pregnant or breastfeeding, discuss your options and specific situation with your healthcare provider.    How do I report side effects with EVUSHELD?  Contact your healthcare provider if you have any side effects that bother you or do not go away. Report side effects to FDA MedWatch at [employer] or call 1-800-FDA-1088 or call [employer] at [phone].    Additional Information  If you have questions, visit the website or call the telephone number provided below.    Website  Telephone number    http://www.evusheld.com  [phone]      How can I learn more about COVID-19?  ?? Ask your healthcare provider.  ?? Visit: [email or web address]    ?? Contact your local or state public health department.    What is an Emergency Use Authorization?  The [place] FDA has made EVUSHELD (tixagevimab co-packaged with cilgavimab) available under an emergency access mechanism called an Emergency Use Authorization EUA. The EUA is supported by a Surveyor, minerals and Human Service (HHS) declaration that circumstances exist to justify the emergency use of drugs and biological products during the COVID-19 pandemic.    EVUSHELD for pre-exposure prophylaxis for prevention of coronavirus disease 2019 (COVID-19) caused by the SARS-CoV-2 virus has not undergone the same type of review as an FDA-approved product. In issuing an EUA under the COVID-19 public health emergency, the FDA has determined, among other things, that based on the total amount of scientific evidence available including data from adequate and well-controlled clinical trials, if available, it is reasonable to believe that the product may be effective for diagnosing, treating, or preventing COVID-19, or a serious or life-threatening disease or condition caused by COVID-19; that the known and potential benefits of the product, when used to diagnose, treat, or prevent such disease or condition, outweigh the known and potential risks of such product; and that there are no adequate, approved and available alternatives.    All of these criteria must be met to allow for the product to be used in the treatment of patients during the COVID-19 pandemic. The EUA for EVUSHELD is in effect for the duration of the COVID-19 declaration justifying emergency use of EVUSHELD,  unless terminated or revoked (after which EVUSHELD may no longer be used under the EUA).    Distributed by: [employer], [place], [place] [address]    Manufactured by: [employer], [address], [place], [place] [address], [place]    ??[employer] 2021. All rights reserved.     Patient Education   Frequently Asked Questions on the Emergency Use Authorization for Evusheld (tixagevimab co-packaged with cilgavimab) for Pre-exposure Prophylaxis (PrEP) of COVID-19     Q. What is an Emergency Use Authorization (EUA)?   A: Under section 564 of the [employer], Drug & Cosmetic Act, after a declaration by the [facility] Secretary based on one of four types of determinations, FDA may authorize an unapproved product or unapproved uses of an approved product for emergency use. In issuing an EUA, FDA must determine, among other things, that based on the totality of scientific evidence available to the Agency, including data from adequate and well-controlled clinical trials, if available, it is reasonable to  believe that the product may be effective in diagnosing, treating, or preventing a serious or life-threatening disease or condition caused by a chemical, biological, radiological, or nuclear agent; that the known and potential benefits, when used to treat, diagnose or prevent such disease or condition, outweigh the known and potential risks for the product; and that there are no adequate, approved, and available alternatives. Emergency use authorization is NOT the same as FDA approval or licensure.     Q. What does this EUA authorize? What are the limitations of authorized use?   A: The EUA authorizes [employer]???s Evusheld (tixagevimab co-packaged with cilgavimab) for emergency use as pre-exposure prophylaxis for prevention of COVID-19 in adults and pediatric individuals (48 years of age and older weighing at least 40 kg):     ??? Who are not currently infected with SARS-CoV-2 and who have not had a known recent exposure to an individual infected with SARS-CoV-2 and  o Who have moderate to severe immune compromise due to a medical condition or receipt of immunosuppressive medications or treatments and may not mount an adequate immune response to COVID-19 vaccination or  o For whom vaccination with any available COVID-19 vaccine, according to the approved or authorized schedule, is not recommended due to a history of severe adverse reaction (e.g., severe allergic reaction) to a COVID-19 vaccine(s) and/or COVID-19 vaccine component(s).     Limitations of Authorized Use     ??? Evusheld is not authorized for use in individuals:   o For treatment of COVID-19, or   o For post-exposure prophylaxis of COVID-19 in individuals who have been exposed to someone infected with SARS-CoV-2.   ??? Pre-exposure prophylaxis with Evusheld is not a substitute for vaccination in individuals for whom COVID-19 vaccination is recommended. Individuals for whom COVID-19 vaccination is recommended, including individuals with moderate to severe immune compromise who may derive benefit from COVID-19 vaccination, should receive COVID-19 vaccination.   ??? In individuals who have received a COVID-19 vaccine, Evusheld should be administered at least two weeks after vaccination.     Q. What are some medical conditions or treatments that may lead to an inadequate immune response to the COVID-19 vaccination?   A: Medical conditions or treatments that may result in moderate to severe immunocompromise and an inadequate immune response to COVID-19 vaccination include but are not limited to:   ??? Active treatment for solid tumor and hematologic malignancies   ??? Receipt of solid-organ transplant and taking immunosuppressive therapy   ??? Receipt of chimeric antigen receptor (CAR)-T-cell or hematopoietic stem cell transplant (within 2 years of transplantation or taking immunosuppression therapy)   ??? Moderate or severe primary immunodeficiency (e.g., DiGeorge syndrome, Wiskott-Aldrich syndrome)   ??? Advanced or untreated HIV infection (people with HIV and CD4 cell counts)  ??? Active treatment with high-dose corticosteroids (i.e., ?20 mg prednisone or equivalent per day when administered for ?2 weeks), alkylating agents, antimetabolites, transplant-related immunosuppressive drugs, cancer chemotherapeutic agents classified as severely immunosuppressive, tumor-necrosis (TNF) blockers, and other biologic agents that are immunosuppressive or immunomodulatory (e.g., B-cell depleting agents)     For additional information, refer to the [facility] Immunizations website.     Q. Is Evusheld approved by the FDA to prevent or treat COVID-19?   A. No. Evusheld is not FDA-approved to prevent or treat any diseases or conditions, including COVID-19. Evusheld is an investigational drug.     Q. How can Evusheld be obtained for use under the EUA?   A. For questions on how to  obtain Evusheld, please contact COVID19therapeutics@hhs .gov.     Q. Who may prescribe Evusheld under the EUA?   A. Evusheld may only be prescribed for an individual patient by physicians, advanced practice registered nurses, and physician assistants that are licensed or authorized under state law to prescribe drugs in the therapeutic class to which Evusheld belongs (i.e., anti-infectives).     Q. Are tixagevimab and cilgavimab monoclonal antibodies? What is a monoclonal antibody?   A. Yes, tixagevimab and cilgavimab are monoclonal antibodies. Monoclonal antibodies are laboratory-produced molecules engineered to serve as substitute antibodies that can restore, enhance or mimic the immune system's attack on pathogens. Evusheld is designed to block viral attachment and entry into human cells, thus neutralizing the virus.     Q. When should Evusheld be administered to a patient?   A. Patients should talk to their health care provider to determine whether, based on their individual circumstances, they are eligible to receive Evusheld, and when it should be administered. More information about administration is available in the Fact Sheet for Health Care Providers.     Q. Are there potential side effects of Evusheld?   A. Possible side effects of Evusheld include the following:     Allergic reactions can happen during and after injection of Evusheld. Reactions to Evusheld may include difficulty breathing or swallowing; shortness of breath; wheezing; swelling of the face, lips, tongue or throat; rash including hives; or itching.     The side effects of getting any medicine by intramuscular injection may include pain, bruising of the skin, soreness, swelling, and possible bleeding or infection at the injection site.     Serious cardiac adverse events (such as myocardial infarction and heart failure) were infrequent in the clinical trial evaluating Evusheld for pre-exposure prophylaxis for prevention. However, more trial participants had serious cardiac adverse events after receiving Evusheld compared to placebo. These participants all had risk factors for cardiac disease or a history of cardiovascular disease before participating in the clinical trial. It is not clear if Evusheld caused these cardiac adverse events.     These are not all the possible side effects of Evusheld. Not a lot of people have been given Evusheld. Serious and unexpected side effects may happen. Evusheld is still being studied so it is possible that all of the risks are not known at this time.     Q. Are there reporting requirements for health care facilities and providers as part of the EUA?   A. Yes. As part of the EUA, FDA requires health care providers who prescribe Evusheld to report all medication errors and serious adverse events considered to be potentially related to Evusheld through FDA???s MedWatch Adverse Event Reporting program.     Health care facilities and providers must report therapeutics information and utilization data as directed by the U.S. Department of Health and [employer].     Q. Do patient outcomes need to be reported under the EUA?   A. No, reporting of patient outcomes is not required under the EUA. However, reporting of all medication errors and serious adverse events considered to be potentially related to Evusheld occurring during treatment is required.     Q. FDA has issued a number of EUAs including for therapeutics. If state laws impose different or additional requirements on the medical product covered by an EUA, are those state laws preempted?   A. As stated in FDA???s Emergency Use Authorization of Medical Products and Related Authorities Guidance, ???FDA believes that the terms and conditions  of an EUA issued under section 564 preempt state or local law, both legislative requirements and common-law duties, that impose different or additional requirements on the medical product for which the EUA was issued in the context of the emergency declared under section 564.??? The guidance explains the basis for FDA???s views on this subject.     Q. Can health care providers share the patient/caregiver Fact Sheet electronically?   A. The letter of authorization for Evusheld, requires that Fact Sheets be made available to health care providers and to patients/caregivers ???through appropriate means.??? Electronic delivery of the patient/caregiver Fact Sheet is an appropriate means. For example, when the patient requests the Fact       Sheet electronically, it can be delivered as a PDF prior to medication administration. Health care providers should confirm receipt of the Fact Sheet with the patient.     Q. Can I receive a COVID-19 vaccine if I was treated with a monoclonal antibody for COVID-19?   A. Patients and health care providers should refer to recommendations of the Advisory Committee on Immunization Practices regarding vaccination.     Q. Can I receive Evusheld if I recently received a COVID-19 vaccine?   A. Evusheld may reduce your body???s immune response to a COVID-19 vaccine. If you receive a COVID-19 vaccine, you should wait to receive Evusheld until at least two weeks after your COVID-19 vaccination.     Q. Are there data showing Evusheld may provide benefit for pre-exposure prophylaxis for prevention of COVID-19 in certain patients?   A. Yes. The most important scientific evidence supporting the authorization of Evusheld is from [employer], a randomized, double-blind, placebo-controlled clinical trial in adults who had not received a COVID-19 vaccine and did not have a history of SARS-CoV-2 infection or test positive for SARS-CoV-2 infection at the start of the trial. All trial participants were either ?53 years of age, had a pre-specified co-morbidity (obesity, congestive heart failure, chronic obstructive pulmonary disease, chronic kidney disease, chronic liver disease, immunocompromised state, or previous history of severe or serious adverse event after receiving any approved vaccine), or were at increased risk of SARS-CoV-2 infection due to their living situation or occupation.     The main outcome measured in the trial was whether the trial participant had a case of documented COVID-19 after receiving Evusheld or placebo and before day 183 of the trial. In this trial, 3,441 people received Evusheld and 1,731 received a placebo. In the primary analysis, Evusheld recipients saw a 77% reduced risk of developing COVID-19 compared to those who received a placebo, a statistically significant difference. In additional analyses, the reduction in risk of developing COVID-19 was maintained for Evusheld recipients through six months. The safety and effectiveness of this investigational therapy for use in the pre-exposure prevention of COVID-19 continue to be evaluated.

## 2020-06-15 DIAGNOSIS — R809 Proteinuria, unspecified: Principal | ICD-10-CM

## 2020-06-15 DIAGNOSIS — Z794 Long term (current) use of insulin: Principal | ICD-10-CM

## 2020-06-15 DIAGNOSIS — E1129 Type 2 diabetes mellitus with other diabetic kidney complication: Principal | ICD-10-CM

## 2020-06-15 MED ORDER — LEVEMIR FLEXTOUCH U-100 INSULIN 100 UNIT/ML (3 ML) SUBCUTANEOUS PEN
0 refills | 0.00000 days | Status: CP
Start: 2020-06-15 — End: ?

## 2020-06-16 DIAGNOSIS — R809 Proteinuria, unspecified: Principal | ICD-10-CM

## 2020-06-16 DIAGNOSIS — Z794 Long term (current) use of insulin: Principal | ICD-10-CM

## 2020-06-16 DIAGNOSIS — E1129 Type 2 diabetes mellitus with other diabetic kidney complication: Principal | ICD-10-CM

## 2020-06-16 MED ORDER — LEVEMIR FLEXTOUCH U-100 INSULIN 100 UNIT/ML (3 ML) SUBCUTANEOUS PEN
0 refills | 0.00000 days
Start: 2020-06-16 — End: ?

## 2020-06-17 ENCOUNTER — Non-Acute Institutional Stay: Admit: 2020-06-17 | Discharge: 2020-06-18 | Payer: PRIVATE HEALTH INSURANCE

## 2020-06-17 DIAGNOSIS — L4050 Arthropathic psoriasis, unspecified: Principal | ICD-10-CM

## 2020-06-17 LAB — HEPATIC FUNCTION PANEL
ALBUMIN: 4.1 g/dL (ref 3.4–5.0)
ALKALINE PHOSPHATASE: 104 U/L (ref 46–116)
ALT (SGPT): 33 U/L (ref 10–49)
AST (SGOT): 26 U/L (ref <=34)
BILIRUBIN DIRECT: 0.2 mg/dL (ref 0.00–0.30)
BILIRUBIN TOTAL: 0.4 mg/dL (ref 0.3–1.2)
PROTEIN TOTAL: 7.8 g/dL (ref 5.7–8.2)

## 2020-06-17 LAB — TSH: THYROID STIMULATING HORMONE: 4.24 u[IU]/mL (ref 0.550–4.780)

## 2020-06-17 LAB — IGA: GAMMAGLOBULIN; IGA: 311.4 mg/dL (ref 70.0–400.0)

## 2020-06-17 MED ORDER — COSENTYX PEN 300 MG/2 PENS (150 MG/ML) SUBCUTANEOUS
SUBCUTANEOUS | 3 refills | 84 days | Status: CP
Start: 2020-06-17 — End: ?
  Filled 2020-06-24: qty 2, 28d supply, fill #0

## 2020-06-17 NOTE — Unmapped (Signed)
[facility] Refill Coordination Note    Specialty Medication(s) to be Shipped:   Inflammatory Disorders: Cosentyx    Other medication(s) to be shipped: No additional medications requested for fill at this time     Julia Avila, DOB: 07-12-1965  Phone: [phone] (home)       All above HIPAA information was verified with patient.     Was a Nurse, learning disability used for this call? No    Completed refill call assessment today to schedule patient's medication shipment from the [facility] [phone]).       Specialty medication(s) and dose(s) confirmed: Regimen is correct and unchanged.   Changes to medications: [place] reports no changes at this time.  Changes to insurance: No  Questions for the pharmacist: No    Confirmed patient received Welcome Packet with first shipment. The patient will receive a drug information handout for each medication shipped and additional FDA Medication Guides as required.       DISEASE/MEDICATION-SPECIFIC INFORMATION        For patients on injectable medications: Patient currently has 0 doses left.  Next injection is scheduled for 06/30/20.    SPECIALTY MEDICATION ADHERENCE     Medication Adherence    Patient reported X missed doses in the last month: 0  Specialty Medication: Cosentyx  Patient is on additional specialty medications: No                Cosentyx 150 mg/ml: 0 days of medicine on hand           SHIPPING     Shipping address confirmed in Epic.     Delivery Scheduled: Yes, Expected medication delivery date: 06/25/20.  However, Rx request for refills was sent to the provider as there are none remaining.     Medication will be delivered via UPS to the prescription address in Epic WAM.    Unk Lightning   [facility] Specialty Technician

## 2020-06-17 NOTE — Unmapped (Signed)
Refill sent on 06/15/20

## 2020-06-17 NOTE — Unmapped (Signed)
Secukinumab (Cosentyx) refill  Last ov: 02/07/2020   Next ov: 11/10/2020     Labs:   AST   Date Value Ref Range Status   02/20/2019 22 14 - 38 U/L Final     ALT   Date Value Ref Range Status   02/20/2019 33 <35 U/L Final     Creatinine   Date Value Ref Range Status   02/07/2020 0.63 0.55 - 1.02 mg/dL Final   16/01/9603 5.40 0.57 - 1.00 mg/dL Final     WBC   Date Value Ref Range Status   02/07/2020 7.7 3.5 - 10.5 10*9/L Final     HGB   Date Value Ref Range Status   02/07/2020 13.8 12.0 - 15.5 g/dL Final     HCT   Date Value Ref Range Status   02/07/2020 41.6 35.0 - 44.0 % Final     MCV   Date Value Ref Range Status   02/07/2020 83.8 82.0 - 98.0 fL Final     RDW   Date Value Ref Range Status   02/07/2020 13.3 12.0 - 15.0 % Final     Platelet   Date Value Ref Range Status   02/07/2020 246 150 - 450 10*9/L Final     Neutrophils %   Date Value Ref Range Status   02/07/2020 57.1 % Final     Lymphocytes %   Date Value Ref Range Status   02/07/2020 34.4 % Final     Monocytes %   Date Value Ref Range Status   02/07/2020 5.5 % Final     Eosinophils %   Date Value Ref Range Status   02/07/2020 1.9 % Final     Basophils %   Date Value Ref Range Status   02/07/2020 1.1 % Final

## 2020-06-23 LAB — ALPHA GAL IGE: ALPHA-GAL IGE: <0.1 kU/L (ref <0.1)

## 2020-06-23 LAB — TISSUE TRANSGLUTAMINASE (TTG), IGA
TISSUE TRANSGLUTAMINASE ANTIBODY, IGA: 0.5 U/mL (ref <7)
TTG INTERPRETATION: NEGATIVE

## 2020-06-29 DIAGNOSIS — L4050 Arthropathic psoriasis, unspecified: Principal | ICD-10-CM

## 2020-06-29 DIAGNOSIS — D849 Immunodeficiency, unspecified: Principal | ICD-10-CM

## 2020-07-07 ENCOUNTER — Encounter: Admit: 2020-07-07 | Discharge: 2020-07-08 | Payer: PRIVATE HEALTH INSURANCE

## 2020-07-07 DIAGNOSIS — L4050 Arthropathic psoriasis, unspecified: Principal | ICD-10-CM

## 2020-07-07 DIAGNOSIS — D849 Immunodeficiency, unspecified: Principal | ICD-10-CM

## 2020-07-07 MED ADMIN — tixagevimab-cilgavimab 300 mg/3 mL- 300 mg/3 mL injection 6 mL: 6 mL | INTRAMUSCULAR | @ 15:00:00 | Stop: 2020-07-07

## 2020-07-07 NOTE — Unmapped (Signed)
Patient presents in clinic for Evusheld Injection  Injection given in the left and right dorsogluteal  Lot#: [id number]  Exp:10/15/2020  No injection site reaction noted.  Patient tolerated the medication well.  Time injection given: 1037   am  Patient education discussed with patient, copy of FDA patient fact sheet given.      Return as scheduled with provider.

## 2020-07-17 NOTE — Unmapped (Signed)
[facility] Refill Coordination Note    Specialty Medication(s) to be Shipped:   Inflammatory Disorders: Cosentyx    Other medication(s) to be shipped: No additional medications requested for fill at this time     Julia Avila, DOB: Dec 04, 1965  Phone: [phone] (home)       All above HIPAA information was verified with patient.     Was a Nurse, learning disability used for this call? No    Completed refill call assessment today to schedule patient's medication shipment from the [facility] [phone]).       Specialty medication(s) and dose(s) confirmed: Regimen is correct and unchanged.   Changes to medications: [name] reports no changes at this time.  Changes to insurance: No  Questions for the pharmacist: No    Confirmed patient received a Conservation officer, historic buildings and a Surveyor, mining with first shipment. The patient will receive a drug information handout for each medication shipped and additional FDA Medication Guides as required.       DISEASE/MEDICATION-SPECIFIC INFORMATION        For patients on injectable medications: Patient currently has 0 doses left.  Next injection is scheduled for 4/15.    SPECIALTY MEDICATION ADHERENCE     Medication Adherence    Patient reported X missed doses in the last month: 0  Specialty Medication: Copaxone  Patient is on additional specialty medications: No  Patient is on more than two specialty medications: No  Any gaps in refill history greater than 2 weeks in the last 3 months: no  Demonstrates understanding of importance of adherence: yes  Informant: patient                Cosentyx 150mg /ml: Patient has 0 days of medication on hand      SHIPPING     Shipping address confirmed in Epic.     Delivery Scheduled: Yes, Expected medication delivery date: 4/8.     Medication will be delivered via UPS to the prescription address in Epic WAM.    Olga Millers   [facility] : [facility] Specialty Technician

## 2020-07-23 MED FILL — COSENTYX PEN 300 MG/2 PENS (150 MG/ML) SUBCUTANEOUS: SUBCUTANEOUS | 28 days supply | Qty: 2 | Fill #1

## 2020-08-03 ENCOUNTER — Encounter: Admit: 2020-08-03 | Discharge: 2020-08-04 | Payer: PRIVATE HEALTH INSURANCE

## 2020-08-03 DIAGNOSIS — E1129 Type 2 diabetes mellitus with other diabetic kidney complication: Principal | ICD-10-CM

## 2020-08-03 DIAGNOSIS — F32A Depression, unspecified depression type: Principal | ICD-10-CM

## 2020-08-03 DIAGNOSIS — Z794 Long term (current) use of insulin: Principal | ICD-10-CM

## 2020-08-03 DIAGNOSIS — Z1231 Encounter for screening mammogram for malignant neoplasm of breast: Principal | ICD-10-CM

## 2020-08-03 DIAGNOSIS — R809 Proteinuria, unspecified: Principal | ICD-10-CM

## 2020-08-03 MED ORDER — BUPROPION HCL XL 300 MG 24 HR TABLET, EXTENDED RELEASE
ORAL_TABLET | Freq: Every morning | ORAL | 2 refills | 30.00000 days | Status: CP
Start: 2020-08-03 — End: ?

## 2020-08-03 NOTE — Unmapped (Signed)
Internal Medicine Clinic Visit    Reason for visit: Routine follow-up    A/P:    T2DM: A1C is 6.2% today off all medications (no insulin or ozempic for the last ~2 months).  -Hold Insulin Detemir and Semaglutide  -Freestyle libre for BG monitoring  -CTM need to resume medications. Would resume Ozempic given prior intolerance to Metformin (diarrhea).   -Follow up with Forde Radon in 6 weeks and with me in 12 weeks for repeat A1C.   -Fasting BG goal off 100-130, counseled her she may need to resume ozempic.     HTN, microalbuminuria: BP well controlled today on Lisinopril therapy.   -Lisinopril 10 mg daily  -CTM    Hypothyroidism: TSH has remained normal off Levothyroxine  -CTM    Psoriatic Arthritis: Some continued back pain, but feeling well otherwise without acute joint complaints and skin disease under great control with Cosentyx.  -Cosentyx 300mg  SubQ monthly  -Continue topical agents as needed.  -Received 4 doses of COVID vaccine and Evusheld    Depression: Reports continued intermittent days with depressed mood.   - Increase bupropion XL to 300mg  daily  - Referral to [facility] IM counseling    HLD:   -Atorvastatin 40mg  daily    HCM:   Evusheld - got two weeks ago  Ordered Mammogram for December 2022  Request records for outside Diabetic Eye Exam    Julia Avila was seen today for follow-up.    Diagnoses and all orders for this visit:    Type 2 diabetes mellitus with microalbuminuria, with long-term current use of insulin (CMS-HCC)  -     Cancel: Telehealth Fund[employer] Photography Screening - OU - Both Eyes  -     Health Maintenance Outside Records Request; Future    Depression, unspecified depression type  -     buPROPion (WELLBUTRIN XL) 300 MG 24 hr tablet; Take 1 tablet (300 mg total) by mouth every morning.  -     Ambulatory referral to Counseling; Future    Encounter for screening mammogram for breast cancer  -     Mammography screening bilateral; Future    Other orders  -     POCT Glycosylated Hemoglobin (HGB A1c)    _________________________________________________________    HPI:  Julia Avila is a 55 yo woman with a history of T2DM, HTN, PsA and PsA arthritis, depression who presents for routine follow-up for diabetes.     Noticed BG greatly improved the last several months and started to have episodes of low BGs. Self-discontinued ozempic and insulin. Continues to use Freestyle libre for monitoring and her average BG has been 106 per her CGM readings. Continues to work hard on diet and exercise.     Not checking BP at home, but denies HA/visual changes. No s/sx of orthostasis.     Working as Water engineer, enjoys her job.    Has had some intermittent days where her mood is down, but not all days. Wonders if increasing her Wellbutrin might be helpful as she has been on the same dose for years. Also interested in counseling.     ROS otherwise negative.   _________________________________________________________  Problem List:  Patient Active Problem List   Diagnosis   ??? Anemia   ??? Type 2 diabetes mellitus with microalbuminuria, with long-term current use of insulin (CMS-HCC)   ??? Depression   ??? Psoriatic arthritis (CMS-HCC)   ??? Bilateral hearing loss   ??? Hypertension associated with diabetes (CMS-HCC)   ??? Immunosuppressed status (  CMS-HCC)       Medications:  Reviewed in EPIC  __________________________________________________________    Physical Exam:   Vital Signs:  Vitals:    08/03/20 1539   BP: 128/78   Pulse: 77   Temp: 36 ??C   TempSrc: Temporal   SpO2: 97%   Weight: 76.9 kg (169 lb 9.6 oz)   Height: 160 cm (5' 3)       Gen: Well appearing, NAD  Mouth: Clear oropharynx, no erythema or exudate, no evidence of thrush.   CV: RRR, no murmurs  Pulm: CTA bilaterally, no crackles or wheezes  Abd: Soft, NTND, normal BS. No HSM.  Ext: No edema  Skin: No rashes on exposed skin.

## 2020-08-03 NOTE — Unmapped (Signed)
[facility]       Type of visit:  face to face    Reason for visit: follow up    Questions / Concerns that need to be addressed: patient state she has stop taking levemir and ozempic     General Consent to Treat (GCT) for non-epic video visits only: Verbal consent            Screening BP- 128/78-77            Allergies reviewed: Yes    Medication reviewed: Yes  Pended refills? No        HCDM reviewed and updated in Epic:    We are working to make sure all of our patients??? wishes are updated in Epic and part of that is documenting a Environmental health practitioner for each patient  A Health Care Decision Rodena Piety is someone you choose who can make health care decisions for you if you are not able ??? who would you most want to do this for you????  is already up to date.        BPAs completed:  Diabetes - Foot Exam  Diabetes - POC A1c      COVID-19 Vaccine Summary  Which COVID-19 Vaccine was administered  Pfizer  Type:  Dates Given:  06/19/2020          Date last mAB infusion:  07/07/2020                Immunization History   Administered Date(s) Administered   ??? COVID-19 VACC,MRNA,(PFIZER)(PF)(IM) 06/21/2019, 07/12/2019, 12/04/2019, 06/19/2020   ??? Influenza Vaccine Quad (IIV4 PF) 40mo+ injectable 01/12/2015, 01/13/2016, 01/09/2017, 01/24/2020   ??? Influenza Virus Vaccine, unspecified formulation 01/30/2018, 02/20/2019   ??? PNEUMOCOCCAL POLYSACCHARIDE 23 09/09/2014   ??? Pneumococcal Conjugate 13-Valent 05/31/2019   ??? TdaP 12/13/2019   ??? ZOSTAVAX - ZOSTER VACCINE, LIVE, SQ 06/19/2020

## 2020-08-03 NOTE — Unmapped (Signed)
(  1) Goal for your blood sugars fasting (first thing in morning before eating) would be 100-130. If you see numbers higher than this, send me a message as you may need to re-start the Ozempic.     (2) Will see you again in 3 months to check your A1C and make sure things are still doing okay!    (3) Referral placed to Internal Medicine Counseling.     (4) Increased your dose of Wellbutrin to 300mg  daily.

## 2020-08-10 NOTE — Unmapped (Signed)
Immediately after or during the visit, I reviewed with the resident the medical history and the resident???s findings on physical examination.?? I discussed with the resident the patient???s diagnosis and concur with the treatment plan as documented in the resident note. Danielle Dess, MD

## 2020-08-19 NOTE — Unmapped (Signed)
[facility] Refill Coordination Note    Specialty Medication(s) to be Shipped:   Inflammatory Disorders: Cosentyx    Other medication(s) to be shipped: No additional medications requested for fill at this time     Julia Avila, DOB: 10-31-1965  Phone: [phone] (home)       All above HIPAA information was verified with patient.     Was a Nurse, learning disability used for this call? No    Completed refill call assessment today to schedule patient's medication shipment from the [facility] [phone]).  All relevant notes have been reviewed.     Specialty medication(s) and dose(s) confirmed: Regimen is correct and unchanged.   Changes to medications: Delsy reports no changes at this time.  Changes to insurance: No  New side effects reported not previously addressed with a pharmacist or physician: None reported  Questions for the pharmacist: No    Confirmed patient received a Conservation officer, historic buildings and a Surveyor, mining with first shipment. The patient will receive a drug information handout for each medication shipped and additional FDA Medication Guides as required.       DISEASE/MEDICATION-SPECIFIC INFORMATION        For patients on injectable medications: Patient currently has 0 doses left.  Next injection is scheduled for 5/15.    SPECIALTY MEDICATION ADHERENCE     Medication Adherence    Patient reported X missed doses in the last month: 0  Specialty Medication: cosentyx  Patient is on additional specialty medications: No  Patient is on more than two specialty medications: No  Any gaps in refill history greater than 2 weeks in the last 3 months: no  Demonstrates understanding of importance of adherence: yes  Informant: patient              Were doses missed due to medication being on hold? No    Cosentyx 150mg /ml: Patient has 0 days of medication on hand     REFERRAL TO PHARMACIST     Referral to the pharmacist: Not needed      [facility]     Shipping address confirmed in Epic. Delivery Scheduled: Yes, Expected medication delivery date: 5/12.     Medication will be delivered via UPS to the prescription address in Epic WAM.    Olga Millers   [facility] Specialty Technician

## 2020-08-26 MED FILL — COSENTYX PEN 300 MG/2 PENS (150 MG/ML) SUBCUTANEOUS: SUBCUTANEOUS | 28 days supply | Qty: 2 | Fill #2

## 2020-09-18 ENCOUNTER — Ambulatory Visit
Admit: 2020-09-18 | Payer: PRIVATE HEALTH INSURANCE | Attending: Pharmacist Clinician (PhC)/ Clinical Pharmacy Specialist | Primary: Pharmacist Clinician (PhC)/ Clinical Pharmacy Specialist

## 2020-09-18 NOTE — Unmapped (Signed)
[facility] Refill Coordination Note    Specialty Medication(s) to be Shipped:   Inflammatory Disorders: Cosentyx    Other medication(s) to be shipped: No additional medications requested for fill at this time     Julia Avila, DOB: 01-Mar-1966  Phone: [phone] (home)       All above HIPAA information was verified with patient.     Was a Nurse, learning disability used for this call? No    Completed refill call assessment today to schedule patient's medication shipment from the [facility] [phone]).  All relevant notes have been reviewed.     Specialty medication(s) and dose(s) confirmed: Regimen is correct and unchanged.   Changes to medications: Dayana reports no changes at this time.  Changes to insurance: No  New side effects reported not previously addressed with a pharmacist or physician: None reported  Questions for the pharmacist: No    Confirmed patient received a Conservation officer, historic buildings and a Surveyor, mining with first shipment. The patient will receive a drug information handout for each medication shipped and additional FDA Medication Guides as required.       DISEASE/MEDICATION-SPECIFIC INFORMATION        For patients on injectable medications: Patient currently has 0 doses left.  Next injection is scheduled for 6/15.    SPECIALTY MEDICATION ADHERENCE     Medication Adherence    Patient reported X missed doses in the last month: 0  Specialty Medication: Cosentyx  Patient is on additional specialty medications: No  Patient is on more than two specialty medications: No  Any gaps in refill history greater than 2 weeks in the last 3 months: no  Demonstrates understanding of importance of adherence: yes  Informant: patient              Were doses missed due to medication being on hold? No    Cosentyx 150mg /ml: Patient has 0 days of medication on hand    REFERRAL TO PHARMACIST     Referral to the pharmacist: Not needed      [facility]     Shipping address confirmed in Epic. Delivery Scheduled: Yes, Expected medication delivery date: 6/9.     Medication will be delivered via UPS to the prescription address in Epic WAM.    Olga Millers   [facility] Specialty Technician

## 2020-09-23 ENCOUNTER — Encounter: Admit: 2020-09-23 | Discharge: 2020-09-24 | Payer: PRIVATE HEALTH INSURANCE

## 2020-09-23 MED FILL — COSENTYX PEN 300 MG/2 PENS (150 MG/ML) SUBCUTANEOUS: SUBCUTANEOUS | 28 days supply | Qty: 2 | Fill #3

## 2020-10-07 NOTE — Unmapped (Signed)
Prior authorization initiated on 10/07/2020 with/ from [facility] via [employer].    Medication: Ozempic (0.25 or 0.5 MG/DOSE) 2MG /1.5ML pen-injectors      Reference ID: Key:??[id number]       Status: in progress.    Pharmacy Notified: not at this time.    Patient Notified: not at this time.    Provider Notified: not at this time.    Clinical Information: Jana Hakim, CMA II

## 2020-10-07 NOTE — Unmapped (Signed)
Prior authorization

## 2020-10-07 NOTE — Unmapped (Signed)
Prior authorization received on 10/07/2020 with/ from [facility] via [employer].    Medication: Ozempic (0.25 or 0.5 MG/DOSE) 2MG /1.5ML pen-injectors      Reference ID: Key:??[id number]       Status: approved: 10/07/2020; coverage dates: effective 10/07/2020 through 10/06/2021.    Pharmacy Notified: yes.    Patient Notified: pharmacy to notify.    Provider Notified: not at this time.    Clinical Information: Jana Hakim, CMA II

## 2020-10-27 NOTE — Unmapped (Signed)
[facility] Clinical Assessment & Refill Coordination Note    Julia Avila, DOB: 1966-01-05  Phone: [phone] (home)     All above HIPAA information was verified with patient.     Was a Nurse, learning disability used for this call? No    Specialty Medication(s):   Inflammatory Disorders: Cosentyx     Current Outpatient Medications   Medication Sig Dispense Refill   ??? atorvastatin (LIPITOR) 80 MG tablet Take 1 tablet (80 mg total) by mouth daily. 90 tablet 3   ??? BD ULTRA-FINE MINI PEN NEEDLE 31 gauge x 3/16 (5 mm) Ndle USE 3 TIMES DAILY (Patient not taking: Reported on 10/27/2020) 100 each 3   ??? blood sugar diagnostic (GLUCOSE BLOOD) Strp Test blood sugars three times daily. ICD-10 E11.9 100 strip 11   ??? blood-gluc transmitter-sensor Misc 1 each by Miscellaneous route every fourteen (14) days. 6 each 3   ??? blood-glucose meter kit Use as instructed 1 each 0   ??? blood-glucose meter Misc Use to check blood sugars three times daily. ICD10 E11.9 1 each 0   ??? buPROPion (WELLBUTRIN XL) 300 MG 24 hr tablet Take 1 tablet (300 mg total) by mouth every morning. 30 tablet 2   ??? clobetasoL (TEMOVATE) 0.05 % external solution Apply topically Two (2) times a day. 50 mL 0   ??? flash glucose sensor (FREESTYLE LIBRE 14 DAY SENSOR) kit by Other route every fourteen (14) days. Please dispense sensors for Freestyle Libre 14 day System. Use to check blood sugars continuously. E11.29 Z79.4. 6 kit 3   ??? FREESTYLE LIBRE 14 DAY READER USE AS DIRECTED 1 each 11   ??? inhalational spacing device Spcr Use with inhaler to maximize medication delivery to lungs (Patient not taking: Reported on 04/16/2020) 1 each 0   ??? lancets Misc 1 each by Other route once daily. Test daily before breakfast. Dispense 90 day supply. 100 each 3   ??? lisinopriL (PRINIVIL,ZESTRIL) 10 MG tablet Take 1 tablet (10 mg total) by mouth daily. 90 tablet 3   ??? mometasone (ELOCON) 0.1 % lotion Apply with cotton ball to ear canals t bilaterally once every 2weeks 60 mL 0   ??? mupirocin (BACTROBAN) 2 % ointment Apply topically to open areas on arms twice daily as needed. 30 g 4   ??? naproxen (EC NAPROSYN) 500 MG EC tablet Take 1 tablet (500 mg total) by mouth two (2) times a day as needed. (Patient not taking: No sig reported) 60 tablet 5   ??? pen needle, diabetic (BD ULTRA-FINE NANO PEN NEEDLE) 32 gauge x 5/32 (4 mm) Ndle Needles for insulin and Ozempic pen. Inject daily as instructed. ICD-10 E11.9 (Patient not taking: Reported on 10/27/2020) 100 each 3   ??? secukinumab (COSENTYX PEN, 2 PENS,) 150 mg/mL PnIj injection Inject the contents of 2 pens (300mg  total) under the skin every 28 days 6 mL 3   ??? semaglutide (OZEMPIC) 1 mg/dose (4 mg/3 mL) PnIj injection Inject 1 mg under the skin every seven (7) days. (Patient not taking: Reported on 10/27/2020) 3 mL 11     No current facility-administered medications for this visit.        Changes to medications: Julia Avila Reports stopping the following medications: Ozempic and Gabapentin    Allergies   Allergen Reactions   ??? Nitrofurantoin Macrocrystal Rash     Large red welts all over her body   ??? Metformin Diarrhea       Changes to allergies: No  SPECIALTY MEDICATION ADHERENCE     Cosentyx 150 mg/ml: 3 days of medicine on hand       Medication Adherence    Patient reported X missed doses in the last month: 0  Specialty Medication: Cosentyx 150 mg/mL  Informant: patient          Specialty medication(s) dose(s) confirmed: Regimen is correct and unchanged.     Are there any concerns with adherence? No    Adherence counseling provided? Not needed    CLINICAL MANAGEMENT AND INTERVENTION      Clinical Benefit Assessment:    Do you feel the medicine is effective or helping your condition? Yes    Clinical Benefit counseling provided? Not needed    Adverse Effects Assessment:    Are you experiencing any side effects? No    Are you experiencing difficulty administering your medicine? No    Quality of Life Assessment:    Quality of Life Rheumatology  On a scale of 1 - 10 with 1 representing not at all and 10 representing completely - how has your rheumatologic condition affected your:  Daily pain level?: decline to answer  Ability to complete your regular daily tasks (prepare meals, get dressed, etc.)?: decline to answer  Ability to participate in social or family activities?: decline to answer  Oncology  Dermatology               Have you discussed this with your provider? Not needed    Acute Infection Status:    Acute infections noted within Epic:  No active infections  Patient reported infection: None    Therapy Appropriateness:    Is therapy appropriate? Yes, therapy is appropriate and should be continued    DISEASE/MEDICATION-SPECIFIC INFORMATION      For patients on injectable medications: Patient currently has 0 doses left.  Next injection is scheduled for 10/30/20.    PATIENT SPECIFIC NEEDS     - Does the patient have any physical, cognitive, or cultural barriers? No    - Is the patient high risk? No    - Does the patient require a Care Management Plan? No     - Does the patient require physician intervention or other additional services (i.e. nutrition, smoking cessation, social work)? No      SHIPPING     Specialty Medication(s) to be Shipped:   Inflammatory Disorders: Cosentyx    Other medication(s) to be shipped: No additional medications requested for fill at this time     Changes to insurance: No    Delivery Scheduled: Yes, Expected medication delivery date: 10/29/20.     Medication will be delivered via UPS to the confirmed prescription address in [facility].    The patient will receive a drug information handout for each medication shipped and additional FDA Medication Guides as required.  Verified that patient has previously received a Conservation officer, historic buildings and a Surveyor, mining.    The patient or caregiver noted above participated in the development of this care plan and knows that they can request review of or adjustments to the care plan at any time.      All of the patient's questions and concerns have been addressed.    Camillo Flaming   [facility] Specialty Pharmacist

## 2020-10-28 MED FILL — COSENTYX PEN 300 MG/2 PENS (150 MG/ML) SUBCUTANEOUS: SUBCUTANEOUS | 28 days supply | Qty: 2 | Fill #4

## 2020-10-30 ENCOUNTER — Encounter
Admit: 2020-10-30 | Discharge: 2020-10-31 | Payer: PRIVATE HEALTH INSURANCE | Attending: Pharmacist Clinician (PhC)/ Clinical Pharmacy Specialist | Primary: Pharmacist Clinician (PhC)/ Clinical Pharmacy Specialist

## 2020-10-30 DIAGNOSIS — E559 Vitamin D deficiency, unspecified: Principal | ICD-10-CM

## 2020-10-30 DIAGNOSIS — I152 Hypertension secondary to endocrine disorders: Principal | ICD-10-CM

## 2020-10-30 DIAGNOSIS — F32A Depression, unspecified depression type: Principal | ICD-10-CM

## 2020-10-30 DIAGNOSIS — R809 Proteinuria, unspecified: Principal | ICD-10-CM

## 2020-10-30 DIAGNOSIS — E1159 Type 2 diabetes mellitus with other circulatory complications: Principal | ICD-10-CM

## 2020-10-30 DIAGNOSIS — E1129 Type 2 diabetes mellitus with other diabetic kidney complication: Principal | ICD-10-CM

## 2020-10-30 DIAGNOSIS — Z794 Long term (current) use of insulin: Principal | ICD-10-CM

## 2020-10-30 NOTE — Unmapped (Deleted)
Diabetes Enhanced Care Visit  Assessment/Plan:      No diagnosis found.    1. Diabetes, {Blank single:19197::type 1,type 2,unclear type}  ?? SMBG {Blank single:19197::at goal,above goal,***}  Lab Results   Component Value Date    A1C 6.2 08/03/2020   ?? ; HgbA1c {Blank single:19197::at goal,above goal,***}  ?? Reviewed symptoms and treatment of hypoglycemia, including need to check BG prior to and following treatment.  ?? Pharmacotherapy plan: ***  ?? Diet recommendations: ***  ?? Exercise recommendations: ***  ?? SMBG instructions:  {AMB PHA SMBG Plan:21032599}    DM Related Care  ??? BP ***  ??? The 10-year ASCVD risk score Denman George DC Jr., et al., 2013) is: 12.7% ***  ??? Tobacco use {AMB PHA Smoker:21032605}.***  ??? Urine ACR due ***  ??? DM eye exam due ***  ??? Foot exam due ***  ??? Immunizations due for ***  Immunization History   Administered Date(s) Administered   ??? COVID-19 VACC,MRNA,(PFIZER)(PF)(IM) 06/21/2019, 07/12/2019, 12/04/2019, 06/19/2020   ??? DTaP, Unspecified Formulation 07/16/1967, 08/16/1967, 12/15/1968   ??? Influenza Vaccine Quad (IIV4 PF) 65mo+ injectable 01/12/2015, 01/13/2016, 01/09/2017, 01/24/2020   ??? Influenza Virus Vaccine, unspecified formulation 01/30/2018, 02/20/2019   ??? MMR 07/26/1990, 12/29/2000   ??? Measles 05/17/1968, 08/30/1990   ??? Measles / Rubella 01/06/1989   ??? Mumps 05/18/1971   ??? PNEUMOCOCCAL POLYSACCHARIDE 23 09/09/2014   ??? Pneumococcal Conjugate 13-Valent 05/31/2019   ??? Polio Virus Vaccine, Unspecified Formulation 07/16/1967, 09/15/1967, 11/15/1967, 12/15/1968   ??? Rubella 07/15/1968   ??? Td (adult) unspecified formulation 09/15/1983   ??? TdaP 12/13/2019   ??? Tetanus-Diptheria Toxoids-TD(TDVAX),Asdorbed,2LF(IM) 12/29/2000   ??? ZOSTAVAX - ZOSTER VACCINE, LIVE, SQ 06/19/2020         Vitamin D low dose  ?? On vitamin D3 2000 units daily  ?? Vit D total 13.5 06/20/18    Follow-up: No follow-ups on file.    Future Appointments   Date Time Provider Department Center   10/30/2020  4:00 PM Jeanella Flattery, CPP [facility]   11/03/2020 11:30 AM Staci Righter, PA [facility]   12/11/2020  8:00 AM Diane R Dolan-Soto, LCSW [facility]   03/04/2021 12:40 PM Rumey Sherie Don, MD [facility]       Medication adherence and barriers to the treatment plan have been addressed. Opportunities to optimize healthy behaviors have been discussed. Patient / caregiver voiced understanding.      _________________________________________________    Subjective     Reason for visit:    Julia Avila is a 55 y.o. year old female with a history of diabetes ({Blank single:19197::type 1,type 2,unclear type}), who presents today for a diabetes enhanced care visit.  Patient presents to this visit {Blank single:19197::alone,accompanied by *** who provides part of the history}    Since Last visit / History of Present Ilness:    Patient reports {AMB PHA Pt reports:21032589:o} plan from last visit. ***    Reported DM Regimen:  ***no longer on any DM meds    Medication Adherence and Access:  Missed doses: ***  Affordability: ***    CGM  100% time in therapeutic range  Avg 109  GMI 5.9%    Home BP   112/72  129/79      Diet:  ***    Exercise:  ***    _________________________________________________    Medications:  Medications were reviewed and updated in Epic today.         Objective  ROS     Physical Examination:  Vitals:    Vitals:    10/30/20 1552   BP: 140/82   Pulse: 70   Resp: 18   Temp: 36.6 ??C (97.9 ??F)   TempSrc: Tympanic   SpO2: 97%   Weight: 75.3 kg (166 lb)   Height: 156 cm (5' 1.42)       Wt Readings from Last 3 Encounters:   10/30/20 75.3 kg (166 lb)   08/03/20 76.9 kg (169 lb 9.6 oz)   05/11/20 78.9 kg (174 lb)       Body mass index is 30.94 kg/m??.    Physical Exam

## 2020-10-30 NOTE — Unmapped (Addendum)
Go back to [facility] for your 2nd Shingrix immunization (due now)    Your blood pressure goal is less than 130/80.    Make sure you have a validated home blood pressure monitor by checking on this website: ValidateBP.org  Check blood pressure 3 times every morning and 3 times every evening for 7 days. Sending me the readings.  Information about monitoring blood pressure at home:   [email or web address].[email or web address]

## 2020-10-30 NOTE — Unmapped (Signed)
Diabetes Enhanced Care Visit  Assessment/Plan:      1. Vitamin D deficiency    2. Hypertension associated with diabetes (CMS-HCC)    3. Depression, unspecified depression type    4. Type 2 diabetes mellitus with microalbuminuria, with long-term current use of insulin (CMS-HCC)        1. Diabetes, type 2  ?? CGM at goal. Not currently on medications for DM  Lab Results   Component Value Date    A1C 6.2 08/03/2020    HgbA1c at goal. Recommend rechecking ~02/02/21.   I encouraged her to maintain dietary and exercise changes.    DM Related Care  ??? htn slightly above goal today on lisinopril 10mg  daily. Home readings (n=2) and last clinic bp at goal. She will check home BPs and send me a mychart message with the results.  ??? The 10-year ASCVD risk score Denman George DC Jr., et al., 2013) is: 12.7%   ??? Tobacco use Non-smoker.  ??? Urine ACR due 01/23/21  ??? DM [facility] exam done at [facility] ~06/2020. HM Records request placed today.  ??? Foot exam done by care partner today  ??? Immunizations due for Shingrix dose 2 due now  Immunization History   Administered Date(s) Administered   ??? COVID-19 VAC,MRNA,TRIS(12Y UP)(PFIZER)(IM)(PF) 06/19/2020   ??? COVID-19 VACC,MRNA,(PFIZER)(PF)(IM) 06/21/2019, 07/12/2019, 12/04/2019, 06/19/2020   ??? DTaP, Unspecified Formulation 07/16/1967, 08/16/1967, 12/15/1968   ??? Influenza Vaccine Quad (IIV4 PF) 22mo+ injectable 01/12/2015, 01/13/2016, 01/09/2017, 01/24/2020   ??? Influenza Virus Vaccine, unspecified formulation 01/30/2018, 02/20/2019   ??? MMR 07/26/1990, 12/29/2000   ??? Measles 05/17/1968, 08/30/1990   ??? Measles / Rubella 01/06/1989   ??? Mumps 05/18/1971   ??? PNEUMOCOCCAL POLYSACCHARIDE 23 09/09/2014   ??? Pneumococcal Conjugate 13-Valent 05/31/2019   ??? Polio Virus Vaccine, Unspecified Formulation 07/16/1967, 09/15/1967, 11/15/1967, 12/15/1968   ??? Rubella 07/15/1968   ??? SHINGRIX-ZOSTER VACCINE (HZV), RECOMBINANT,SUB-UNIT,ADJUVANTED IM 06/19/2020   ??? Td (adult) unspecified formulation 09/15/1983   ??? TdaP 12/13/2019   ??? Tetanus-Diptheria Toxoids-TD(TDVAX),Asdorbed,2LF(IM) 12/29/2000         #Vitamin D low dose  ?? On vitamin D3 2000 units daily  ?? Vit D total 13.5 06/20/18; recommend rechecking Vit D level with next set of labs. Ordered today.     #Depression  PHQ-9 Score:  PHQ-9 TOTAL SCORE: 2  - no changes today, continue bupropion XL 300mg  daily    Follow-up: Return in about 3 months (around 01/30/2021) for Dr. Aletha Halim late afternoon.    Future Appointments   Date Time Provider Department Center   11/03/2020 11:30 AM Staci Righter, PA [facility]   12/11/2020  8:00 AM Diane R Dolan-Soto, LCSW [facility]   02/05/2021  4:00 PM Coralee Pesa, MD [facility]   03/04/2021 12:40 PM Rumey Sherie Don, MD [facility]       Medication adherence and barriers to the treatment plan have been addressed. Opportunities to optimize healthy behaviors have been discussed. Patient / caregiver voiced understanding.      _________________________________________________    Subjective     Reason for visit:    Julia Avila is a 55 y.o. year old female with a history of diabetes (type 2), who presents today for a diabetes enhanced care visit.  Patient presents to this visit alone    Since Last visit / History of Present Ilness:    Patient reports fully implementing plan from last visit.     Reported DM Regimen:  no longer on any DM meds    Medication Adherence and Access:  Missed doses: none, using pill packs  Affordability: no concerns     CGM (Libre)  100% time in therapeutic range  Avg 109  GMI 5.9%    Home BP   112/72  129/79    _________________________________________________    Medications:  Medications were reviewed and updated in Epic today.         Objective     ROS     Physical Examination:  Vitals:    Vitals:    10/30/20 1552   BP: 140/82   Pulse: 70   Resp: 18   Temp: 36.6 ??C (97.9 ??F)   TempSrc: Tympanic   SpO2: 97%   Weight: 75.3 kg (166 lb)   Height: 156 cm (5' 1.42)       Wt Readings from Last 3 Encounters:   10/30/20 75.3 kg (166 lb)   08/03/20 76.9 kg (169 lb 9.6 oz)   05/11/20 78.9 kg (174 lb)       Body mass index is 30.94 kg/m??.    Physical Exam

## 2020-10-30 NOTE — Unmapped (Signed)
[facility] BPs  BP#1 139/84   BP#2 145/83  BP#3 135/79    Average BP140/82  (please note this as a comment in vitals)    [facility] Checklist       Type of visit:  face to face    Reason for visit: dm    Questions / Concerns that need to be addressed: dm    General Consent to Treat (GCT) for non-epic video visits only: Verbal consent      Allergies reviewed: Yes    Medication reviewed: Yes  Pended refills? No        HCDM reviewed and updated in Epic:    We are working to make sure all of our patients??? wishes are updated in Epic and part of that is documenting a Environmental health practitioner for each patient  A Health Care Decision Maker is someone you choose who can make health care decisions for you if you are not able - who would you most want to do this for you????  is already up to date.        BPAs completed:  PHQ9  AUDIT - Alcohol Screen  HARK - Interpersonal Violence      COVID Vaccination:  If Care Gap for COVID vaccine is present:  Have you received a COVID vaccine? yes  If yes: How many doses have you received? 0/1/2: 2   Type of Vaccine received?    Date of 1st dose:    Date of 2nd dose:    If no: Are you interested in scheduling?         __________________________________________________________________________________________    SCREENINGS COMPLETED IN FLOWSHEETS    HARK Screening  HARK Screening  Within the last year, have you been humiliated or emotionally abused in other ways by your partner or ex-partner?: No  Within the last year, have you been afraid of your partner or ex-partner?: No  Within the last year, have you been raped or forced to have any kind of sexual activity by your partner or ex-partner?: No  Within the last year, have you been kicked, hit, slapped, or otherwise physically hurt by your partner or ex-partner?: No    AUDIT  AUDIT - C Score (Part 1): 0         PHQ9  Thoughts that you would be better off dead, or of hurting yourself in some way: Not at all  PHQ-9 TOTAL SCORE: 2

## 2020-11-18 ENCOUNTER — Encounter: Admit: 2020-11-18 | Discharge: 2020-11-18 | Payer: PRIVATE HEALTH INSURANCE

## 2020-11-18 DIAGNOSIS — L4050 Arthropathic psoriasis, unspecified: Principal | ICD-10-CM

## 2020-11-18 LAB — CBC W/ AUTO DIFF
BASOPHILS ABSOLUTE COUNT: 0.1 10*9/L (ref 0.0–0.1)
BASOPHILS RELATIVE PERCENT: 1.1 %
EOSINOPHILS ABSOLUTE COUNT: 0.1 10*9/L (ref 0.0–0.5)
EOSINOPHILS RELATIVE PERCENT: 1.6 %
HEMATOCRIT: 42.5 % (ref 34.0–44.0)
HEMOGLOBIN: 14.1 g/dL (ref 11.3–14.9)
LYMPHOCYTES ABSOLUTE COUNT: 2.6 10*9/L (ref 1.1–3.6)
LYMPHOCYTES RELATIVE PERCENT: 32.8 %
MEAN CORPUSCULAR HEMOGLOBIN CONC: 33.3 g/dL (ref 32.0–36.0)
MEAN CORPUSCULAR HEMOGLOBIN: 27.7 pg (ref 25.9–32.4)
MEAN CORPUSCULAR VOLUME: 83.1 fL (ref 77.6–95.7)
MEAN PLATELET VOLUME: 9.9 fL (ref 6.8–10.7)
MONOCYTES ABSOLUTE COUNT: 0.5 10*9/L (ref 0.3–0.8)
MONOCYTES RELATIVE PERCENT: 5.7 %
NEUTROPHILS ABSOLUTE COUNT: 4.7 10*9/L (ref 1.8–7.8)
NEUTROPHILS RELATIVE PERCENT: 58.8 %
NUCLEATED RED BLOOD CELLS: 0 /100{WBCs} (ref <=4)
PLATELET COUNT: 197 10*9/L (ref 150–450)
RED BLOOD CELL COUNT: 5.11 10*12/L (ref 3.95–5.13)
RED CELL DISTRIBUTION WIDTH: 13.6 % (ref 12.2–15.2)
WBC ADJUSTED: 8 10*9/L (ref 3.6–11.2)

## 2020-11-18 LAB — CREATININE
CREATININE: 0.77 mg/dL
EGFR CKD-EPI (2021) FEMALE: >90 mL/min/{1.73_m2} (ref >=60)

## 2020-11-18 MED ORDER — TRIAMCINOLONE ACETONIDE 0.1 % TOPICAL OINTMENT
Freq: Two times a day (BID) | TOPICAL | 1 refills | 0 days | Status: CP
Start: 2020-11-18 — End: 2021-11-18

## 2020-11-18 NOTE — Unmapped (Addendum)
Call to schedule an appointment with dermatology at [phone].

## 2020-11-18 NOTE — Unmapped (Signed)
REASON FOR VISIT: f/u psoriatic arthritis    HISTORY: Ms. Deutschman is a 55 y.o. female with hx of psoriasis (since childhood) and psoriatic arthritis. Has had peripheral and axial involvement.   Treatment hx:   - mtx w/o relief  - Enbrel with good results for 9 years, then loss of efficacy  - Enbrel changed to humira in 2015, gaps in therapy due to recurrent UTIs, found to have nephrolithiasis s/p lithotripsy and ureteral dilation.   H[facility]erson [place] w/o efficacy  - Simponi w/o efficacy  - Cosentyx since 2016.   - Current med regimen: cosentyx 300 mg q 28 days monotherapy     Interim history:  Presents today for follow-up.    She feels she has done well on the Cosentyx.  Reports this has worked well for both skin and joints.  She has no pain today.  Occasionally she will have some stiffness in her hands, cannot correlate this to any particular activity or change in weather.  Generally she has minimal morning stiffness.  Skin has been doing well.  She still has small flares of psoriasis in the ears and scalp.  Also notes small areas of flaking and eyelids that she believes is probably psoriasis.  She has been using topical steroids written by dermatology, but refills on some of these have expired.    Tolerates Cosentyx well.    She inquires about reducing or eliminating DMARD therapy if her psoriasis continues to be well controlled, we discussed the option for tapering therapy if she remains in remission for at least a year, would consider reducing Cosentyx to 150 mg monthly.    CURRENT MEDICATIONS:  Current Outpatient Medications   Medication Sig Dispense Refill   ??? atorvastatin (LIPITOR) 80 MG tablet Take 1 tablet (80 mg total) by mouth daily. 90 tablet 3   ??? BD ULTRA-FINE MINI PEN NEEDLE 31 gauge x 3/16 (5 mm) Ndle USE 3 TIMES DAILY (Patient not taking: Reported on 10/27/2020) 100 each 3   ??? blood sugar diagnostic (GLUCOSE BLOOD) Strp Test blood sugars three times daily. ICD-10 E11.9 100 strip 11   ??? blood-gluc transmitter-sensor Misc 1 each by Miscellaneous route every fourteen (14) days. 6 each 3   ??? blood-glucose meter kit Use as instructed 1 each 0   ??? blood-glucose meter Misc Use to check blood sugars three times daily. ICD10 E11.9 1 each 0   ??? buPROPion (WELLBUTRIN XL) 300 MG 24 hr tablet Take 1 tablet (300 mg total) by mouth every morning. 30 tablet 2   ??? cholecalciferol, vitamin D3-50 mcg, 2,000 unit,, (VITAMIN D3) 50 mcg (2,000 unit) cap Take by mouth.     ??? clobetasoL (TEMOVATE) 0.05 % external solution Apply topically Two (2) times a day. 50 mL 0   ??? flash glucose sensor (FREESTYLE LIBRE 14 DAY SENSOR) kit by Other route every fourteen (14) days. Please dispense sensors for Freestyle Libre 14 day System. Use to check blood sugars continuously. E11.29 Z79.4. 6 kit 3   ??? FREESTYLE LIBRE 14 DAY READER USE AS DIRECTED 1 each 11   ??? inhalational spacing device Spcr Use with inhaler to maximize medication delivery to lungs (Patient not taking: Reported on 04/16/2020) 1 each 0   ??? lancets Misc 1 each by Other route once daily. Test daily before breakfast. Dispense 90 day supply. 100 each 3   ??? lisinopriL (PRINIVIL,ZESTRIL) 10 MG tablet Take 1 tablet (10 mg total) by mouth daily. 90 tablet 3   ??? mometasone (ELOCON)  0.1 % lotion Apply with cotton ball to ear canals t bilaterally once every 2weeks 60 mL 0   ??? mupirocin (BACTROBAN) 2 % ointment Apply topically to open areas on arms twice daily as needed. 30 g 4   ??? naproxen (EC NAPROSYN) 500 MG EC tablet Take 1 tablet (500 mg total) by mouth two (2) times a day as needed. (Patient not taking: No sig reported) 60 tablet 5   ??? pen needle, diabetic (BD ULTRA-FINE NANO PEN NEEDLE) 32 gauge x 5/32 (4 mm) Ndle Needles for insulin and Ozempic pen. Inject daily as instructed. ICD-10 E11.9 (Patient not taking: Reported on 10/27/2020) 100 each 3   ??? secukinumab (COSENTYX PEN, 2 PENS,) 150 mg/mL PnIj injection Inject the contents of 2 pens (300mg  total) under the skin every 28 days 6 mL 3     No current facility-administered medications for this visit.       Past Medical History:   Diagnosis Date   ??? Diabetes mellitus (CMS-HCC)    ??? Essential hypertension 01/09/2017   ??? Hypertension    ??? Hypothyroidism    ??? Nephrolithiasis    ??? Psoriasis     Typically on ears and scalp and around eyelids (arthritis is dominant finding typically for patient)   ??? Psoriatic arthritis (CMS-HCC)     Embril for 7 years prior to starting Humara; hips, shoulders, back, hands sometimes   ??? Vitamin D deficiency         Record Review: Available records were reviewed, including pertinent office visits, labs, and imaging.      REVIEW OF SYSTEMS: Ten system were reviewed and negative except as noted above.    PHYSICAL EXAM:  VITAL SIGNS:   Vitals:    11/18/20 1520   BP: 130/85   Pulse: 81   Temp: 36.3 ??C (97.3 ??F)   Weight: 74.4 kg (164 lb)   Height: 154.9 cm (5' 1)     General:   Pleasant 55 y.o.female in no acute distress, WDWN   Cardiovascular:  Regular rate and rhythm. No murmur, rub, or gallop. No lower extremity edema.    Lungs:  Clear to auscultation.Normal respiratory effort.    Musculoskeletal:   General: Ambulates w/o assistance   Hands: Bony enlargement of scattered DIPs.  No swelling.  Able to make a tight fist laterally.  Wrists:FROM w/o swelling or tenderness   Elbows: FROM w/o swelling or tenderness   Shoulders: FROM w/o pain   Hips: FROM w/o pain   Knees: FROM w/o effusions.  Crepitus on range of motion.  Ankles: Tenderness over right Achilles tendon without swelling  Feet: No pain with MTP squeeze    Psych:  Appropriate affect and mood   Skin:  No rashes.  Tiny area of flaking over the top of the scalp.  Several fingertips with open areas of skin that she attributes to biting the skin around her nails         ASSESSMENT/PLAN:  1. Psoriatic arthritis (CMS-HCC)  Stable appearing.  Continue Cosentyx 300 mg q. 28 days.  Encouraged her to follow-up with dermatology, and given the phone number to call to schedule follow-up appointment, last seen in 2021.  Refill triamcinolone to be used on scalp, but encouraged her to not use this on her face.  Check labs below.  - triamcinolone (KENALOG) 0.1 % ointment; Apply topically Two (2) times a day.  Dispense: 30 g; Refill: 1  - CBC w/ Differential  - Creatinine  HCM:   - PCV13 Status: 05/31/2019  - PPSV 23 Status: 09/09/14  -COVID-19 vaccine: Pfizer 06/21/2019, 07/12/2019, 12/04/2019, 06/19/2020.  Moderna 11/18/2020.  - COVID-19 prophylaxis: Pt currently qualifies for Evusheld for COVID prophylaxis. Discussed risks and benefits of Evusheld and pt given fact sheet regarding this medication. Pt elected to receive this, will be scheduled in September.  - Annual Influenza vaccine. Status: Did not discuss  - Bone health: not on prednisone   - Contraception: Postmenopausal  - COVID-19 vaccine:      Return appointment in 4 mo with Dr Scarlette Calico    Greater than 20 minutes spent in visit with patient, including pre and postvisit activities.

## 2020-11-19 NOTE — Unmapped (Signed)
[facility] Refill Coordination Note    Specialty Medication(s) to be Shipped:   Inflammatory Disorders: Cosentyx    Other medication(s) to be shipped: No additional medications requested for fill at this time     Julia Avila, DOB: 05/29/1965  Phone: [phone] (home)       All above HIPAA information was verified with patient.     Was a Nurse, learning disability used for this call? No    Completed refill call assessment today to schedule patient's medication shipment from the [facility] [phone]).  All relevant notes have been reviewed.     Specialty medication(s) and dose(s) confirmed: Regimen is correct and unchanged.   Changes to medications: [name] reports no changes at this time.  Changes to insurance: No  New side effects reported not previously addressed with a pharmacist or physician: None reported  Questions for the pharmacist: No    Confirmed patient received a Conservation officer, historic buildings and a Surveyor, mining with first shipment. The patient will receive a drug information handout for each medication shipped and additional FDA Medication Guides as required.       DISEASE/MEDICATION-SPECIFIC INFORMATION        For patients on injectable medications: Patient currently has 0 doses left.  Next injection is scheduled for 8/15.    SPECIALTY MEDICATION ADHERENCE     Medication Adherence    Patient reported X missed doses in the last month: 0  Specialty Medication: Cosentyx  Patient is on additional specialty medications: No  Patient is on more than two specialty medications: No  Any gaps in refill history greater than 2 weeks in the last 3 months: no  Demonstrates understanding of importance of adherence: yes  Informant: patient              Were doses missed due to medication being on hold? No    Cosentyx 150mg /ml: Patient has 0 days of medication on hand     REFERRAL TO PHARMACIST     Referral to the pharmacist: Not needed      [facility]     Shipping address confirmed in Epic. Delivery Scheduled: Yes, Expected medication delivery date: 8/12.     Medication will be delivered via UPS to the prescription address in Epic WAM.    Olga Millers   [facility] Specialty Technician

## 2020-11-26 MED FILL — COSENTYX PEN 300 MG/2 PENS (150 MG/ML) SUBCUTANEOUS: SUBCUTANEOUS | 28 days supply | Qty: 2 | Fill #5

## 2020-11-27 DIAGNOSIS — F32A Depression, unspecified depression type: Principal | ICD-10-CM

## 2020-11-27 MED ORDER — BUPROPION HCL XL 300 MG 24 HR TABLET, EXTENDED RELEASE
ORAL_TABLET | Freq: Every morning | ORAL | 2 refills | 90.00000 days | Status: CP
Start: 2020-11-27 — End: 2021-08-03

## 2020-12-10 NOTE — Unmapped (Unsigned)
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 self-care management;   []  Referral for outside treatment  []  Bridge to outside treatment  [x]  Other: grief     Patient-stated health goal:   I might be able to target my thought processes into an avenue that isn't so scattered, and not go down the rabbit hole.   Work on how to manage how I pick off and leave things off my plate.      Next visit to provide introduction to problem solving treatment and mind body stress reduction skill training for:     depression and grief      Goals until next visit:  Experiment with lowering caffeine  Plan to get in touch with Dr. Aletha Halim regarding gabapentin to help reduce skin picking and nail biting. (Information also shared with Dr. Aletha Halim who is copied on this note.)      Consider benefit of and encourage walking 2-3x a week for 10 minutes          RCO Tues 9/13 at 3pm by MyChart video        Note copied to:    [x]  Coralee Pesa, MD  []  Other:      Thank you for the referral and the opportunity to be of service.     If patient doesn't engage in counseling here and/or local supports are indicated, please give patient [facility] 24 hour Crisis Line: [phone] for linkage to local services, 24 hour crisis and mobile crisis supports. patient doesn't engage in counseling here and/or local supports are indicated, please give patient [facility] 24 hour Crisis Line: [phone] for linkage to local services, 24 hour crisis and mobile crisis supports.

## 2020-12-11 ENCOUNTER — Institutional Professional Consult (permissible substitution): Admit: 2020-12-11 | Discharge: 2020-12-12 | Payer: PRIVATE HEALTH INSURANCE | Attending: Clinical | Primary: Clinical

## 2020-12-11 DIAGNOSIS — F32A Depression, unspecified depression type: Principal | ICD-10-CM

## 2020-12-11 DIAGNOSIS — F4321 Adjustment disorder with depressed mood: Principal | ICD-10-CM

## 2020-12-11 DIAGNOSIS — F330 Major depressive disorder, recurrent, mild: Principal | ICD-10-CM

## 2020-12-17 NOTE — Unmapped (Signed)
Attempted to call pt to let her know that we will have to cancel evusheld appt for next week. No answer, left VM and sent mychart message. Appt cancelled.

## 2020-12-18 NOTE — Unmapped (Signed)
[facility] Refill Coordination Note    Specialty Medication(s) to be Shipped:   Inflammatory Disorders: Cosentyx    Other medication(s) to be shipped: No additional medications requested for fill at this time     Julia Avila, DOB: 24-Mar-1966  Phone: [phone] (home)       All above HIPAA information was verified with patient.     Was a Nurse, learning disability used for this call? No    Completed refill call assessment today to schedule patient's medication shipment from the [facility] [phone]).  All relevant notes have been reviewed.     Specialty medication(s) and dose(s) confirmed: Regimen is correct and unchanged.   Changes to medications: Desirea reports no changes at this time.  Changes to insurance: No  New side effects reported not previously addressed with a pharmacist or physician: None reported  Questions for the pharmacist: No    Confirmed patient received a Conservation officer, historic buildings and a Surveyor, mining with first shipment. The patient will receive a drug information handout for each medication shipped and additional FDA Medication Guides as required.       DISEASE/MEDICATION-SPECIFIC INFORMATION        For patients on injectable medications: Patient currently has 0 doses left.  Next injection is scheduled for 9/15.    SPECIALTY MEDICATION ADHERENCE     Medication Adherence    Patient reported X missed doses in the last month: 0  Specialty Medication: Cosentyx  Patient is on additional specialty medications: No  Patient is on more than two specialty medications: No  Any gaps in refill history greater than 2 weeks in the last 3 months: no  Demonstrates understanding of importance of adherence: yes  Informant: patient              Were doses missed due to medication being on hold? No    Cosentyx 150mg /ml: Patient has 0 days of medication on hand    REFERRAL TO PHARMACIST     Referral to the pharmacist: Not needed      [facility]     Shipping address confirmed in Epic. Delivery Scheduled: Yes, Expected medication delivery date: 9/13.     Medication will be delivered via UPS to the prescription address in Epic WAM.    Olga Millers   [facility] Specialty Technician

## 2020-12-25 NOTE — Unmapped (Signed)
Per PCP, due to patient being on immunocompromising drugs, they need  to be seen in person for evaluation.  The patient reports inability to come to [facility] today.  The patient was advised to be evaluated at a local urgent care for the symptoms reported, patient verbalized understanding and was going to attempt to be seen.

## 2020-12-25 NOTE — Unmapped (Addendum)
LM to return call to go over urinary symptoms.     Per triage line message patient states developed UTI symptoms yesterday and was requesting  abx be sent to [facility].      3 days ago developed chills/fever  2 days ago diarrhea  1 day ago dysuria

## 2020-12-25 NOTE — Unmapped (Signed)
Feeling under the weather for last three days - diarrhea.  Felt better for half a day but then got dysuria, back pain with emptying bladder  Fever subjectively, chills, hot/cold.  Covid test - not yet

## 2020-12-25 NOTE — Unmapped (Signed)
Reason for Disposition  ??? Side (flank) or lower back pain present  ??? Side (flank) or lower back pain present    Answer Assessment - Initial Assessment Questions  1. SEVERITY: How bad is the pain?  (e.g., Scale 1-10; mild, moderate, or severe)    - MILD (1-3): complains slightly about urination hurting    - MODERATE (4-7): interferes with normal activities      - SEVERE (8-10): excruciating, unwilling or unable to urinate because of the pain       moderate  2. FREQUENCY: How many times have you had painful urination today?       3  3. PATTERN: Is pain present every time you urinate or just sometimes?       every  4. ONSET: When did the painful urination start?       yesterday  5. FEVER: Do you have a fever? If Yes, ask: What is your temperature, how was it measured, and when did it start?      chills  6. PAST UTI: Have you had a urine infection before? If Yes, ask: When was the last time? and What happened that time?       Yes, April, Antibiotics  7. CAUSE: What do you think is causing the painful urination?  (e.g., UTI, scratch, Herpes sore)      Diarrhea 2 days ago  8. OTHER SYMPTOMS: Do you have any other symptoms? (e.g., flank pain, vaginal discharge, genital sores, urgency, blood in urine)      Lower back pain  9. PREGNANCY: Is there any chance you are pregnant? When was your last menstrual period?      no    Answer Assessment - Initial Assessment Questions  1. SYMPTOM: What's the main symptom you're concerned about? (e.g., frequency, incontinence)      Dysuria x 24 hours  2. ONSET: When did the dysuria  start?      12/24/2020  3. PAIN: Is there any pain? If Yes, ask: How bad is it? (Scale: 1-10; mild, moderate, severe)      Patient reports pain with voiding and while bladder is empty 5/10.    4. CAUSE: What do you think is causing the symptoms?      Urinary tract infection stemming from diarrhea that developed 2 days ago.   5. OTHER SYMPTOMS: Do you have any other symptoms? (e.g., fever, flank pain, blood in urine, pain with urination)      Lower back pain, chills  6. PREGNANCY: Is there any chance you are pregnant? When was your last menstrual period?      No    Protocols used: URINARY SYMPTOMS-ADULT-[place], URINATION PAIN - FEMALE-A-[place]

## 2020-12-28 MED FILL — COSENTYX PEN 300 MG/2 PENS (150 MG/ML) SUBCUTANEOUS: SUBCUTANEOUS | 28 days supply | Qty: 2 | Fill #6

## 2020-12-28 NOTE — Unmapped (Unsigned)
INTERNAL MEDICINE COUNSELING SESSION NOTE    LENGTH OF SESSION:  Psychotherapy Session 45 minutes    TYPE OF PSYCHOTHERAPY: Behavioral, Supportive    REASON FOR TREATMENT: Problem Solving Treatment and Mind Body Stress Reduction skill training for depression and grief; link impact of effort to mood.         {    Coding tips - Do not edit this text, it will delete upon signing of note!    ?? Telephone visits [phone] for Physicians and APP???s and [phone] for Non- Physician Clinicians)- Only use minutes on the phone to determine level of service.    ?? Video visits [phone]) - Use both minutes on video and pre/post minutes to determine level of service.       :75688}    The patient reports they are currently: {patient location:81390}. I spent 45 minutes on the {phone audio video visit:67489} with the patient on the date of service. I spent an additional 10 minutes on pre- and post-visit activities on the date of service.     The patient was physically located in [place] or a state in which I am permitted to provide care. The patient and/or parent/guardian understood that s/he may incur co-pays and cost sharing, and agreed to the telemedicine visit. The visit was reasonable and appropriate under the circumstances given the patient's presentation at the time.    The patient and/or parent/guardian has been advised of the potential risks and limitations of this mode of treatment (including, but not limited to, the absence of in-person examination) and has agreed to be treated using telemedicine. The patient's/patient's family's questions regarding telemedicine have been answered.     If the visit was completed in an ambulatory setting, the patient and/or parent/guardian has also been advised to contact their provider???s office for worsening conditions, and seek emergency medical treatment and/or call 911 if the patient deems either necessary.               REVIEW OF FUNCTIONING SINCE LAST VISIT    MOOD (0-10) =  {MOOD:23791}  {NUMBERS 1-10:18281} of 10 with 10 being the  best.     SATISFACTION WITH EFFORT IN CARING FOR MOOD (0-10) =   {NUMBERS 1-10:18281} of 10 with 10 being the  best.     PAIN:     {HH 0-10:109015}  of 10 with 10 being the worst.  At last visit was    0    PHQ-9 SCORE:      of 27   {select_status_or_delete_smartlist:64641}     PHQ-9 PHQ-9 TOTAL SCORE   12/11/2020 11   10/30/2020 2   04/16/2020 13   04/24/2019 3   04/27/2018 7        GAD-7 SCORE:     {NUMBERS 1-31:20750}  of 21    GAD7 Total Score GAD-7 Total Score   12/11/2020 13          CURRENT SUICIDAL/HOMICIDAL IDEATION:    {Blank multiple:29302::None,Endorses passive thoughts of death. No plan or intent to hurt or kill themself. No HI,No HI,Presentation insufficient for IVC. Raised option for hospitalization. Mitigating factors: ***}      PROBLEM LIST:    Patient Active Problem List   Diagnosis   ??? Anemia   ??? Type 2 diabetes mellitus with microalbuminuria, with long-term current use of insulin (CMS-HCC)   ??? 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 Psoriatic arthritis (CMS-HCC)   ??? Bilateral hearing loss   ??? Hypertension  associated with diabetes (CMS-HCC)   ??? Immunosuppressed status (CMS-HCC)   ??? Vitamin D deficiency   ??? Grief         MEDICATION COMPLIANCE:    Taking all medications regularly?  {yes/no:310494}     Any changes in medication since last visit?  {yes/no:310494}     Any missed doses? {yes/no:310494}       Current Outpatient Medications on File Prior to Visit   Medication Sig Dispense Refill   ??? atorvastatin (LIPITOR) 80 MG tablet Take 1 tablet (80 mg total) by mouth daily. 90 tablet 3   ??? BD ULTRA-FINE MINI PEN NEEDLE 31 gauge x 3/16 (5 mm) Ndle USE 3 TIMES DAILY 100 each 3   ??? blood sugar diagnostic (GLUCOSE BLOOD) Strp Test blood sugars three times daily. ICD-10 E11.9 100 strip 11   ??? blood-gluc transmitter-sensor Misc 1 each by Miscellaneous route every fourteen (14) days. 6 each 3   ??? blood-glucose meter kit Use as instructed 1 each 0   ??? blood-glucose meter Misc Use to check blood sugars three times daily. ICD10 E11.9 1 each 0   ??? buPROPion (WELLBUTRIN XL) 300 MG 24 hr tablet Take 1 tablet (300 mg total) by mouth every morning. 90 tablet 2   ??? cholecalciferol, vitamin D3-50 mcg, 2,000 unit,, 50 mcg (2,000 unit) cap Take by mouth.     ??? clobetasoL (TEMOVATE) 0.05 % external solution Apply topically Two (2) times a day. 50 mL 0   ??? flash glucose sensor (FREESTYLE LIBRE 14 DAY SENSOR) kit by Other route every fourteen (14) days. Please dispense sensors for Freestyle Libre 14 day System. Use to check blood sugars continuously. E11.29 Z79.4. 6 kit 3   ??? FREESTYLE LIBRE 14 DAY READER USE AS DIRECTED 1 each 11   ??? inhalational spacing device Spcr Use with inhaler to maximize medication delivery to lungs 1 each 0   ??? lancets Misc 1 each by Other route once daily. Test daily before breakfast. Dispense 90 day supply. 100 each 3   ??? lisinopriL (PRINIVIL,ZESTRIL) 10 MG tablet Take 1 tablet (10 mg total) by mouth daily. 90 tablet 3   ??? mometasone (ELOCON) 0.1 % lotion Apply with cotton ball to ear canals t bilaterally once every 2weeks 60 mL 0   ??? mupirocin (BACTROBAN) 2 % ointment Apply topically to open areas on arms twice daily as needed. 30 g 4   ??? naproxen (EC NAPROSYN) 500 MG EC tablet Take 1 tablet (500 mg total) by mouth two (2) times a day as needed. 60 tablet 5   ??? pen needle, diabetic (BD ULTRA-FINE NANO PEN NEEDLE) 32 gauge x 5/32 (4 mm) Ndle Needles for insulin and Ozempic pen. Inject daily as instructed. ICD-10 E11.9 100 each 3   ??? secukinumab (COSENTYX PEN, 2 PENS,) 150 mg/mL PnIj injection Inject the contents of 2 pens (300mg  total) under the skin every 28 days 6 mL 3   ??? triamcinolone (KENALOG) 0.1 % ointment Apply topically Two (2) times a day. 30 g 1     No current facility-administered medications on file prior to visit.           SLEEPING:     ***  Getting *** hours of sleep.    At assessment:  I'm better at getting sleep than I ever have been.   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  Light sleeper and wakes when her charges wake.  # of hours of sleep: getting 6-10 hours  depending on care issues  5 months ago was sleeping too much        APPETITE:    ***  Caffeine intake and timing if a concern:  Blood glucose levels if patient has diabetes:  prediabetic A1c around 6. Doctors and she want to get level to 5     At assessment:  Is your appetite up, down or the same? Good, and intentionally decreased  Have you lost or gained weight recently? Lost enough weight to get off diabetes medications. Lost 15 lbs in the last 3 months  ??  Caffeine:  [x] ? If yes how much?: 20 oz of coffee  What is the latest time in the day you have caffeine: 1pm. When works 3rd shift will drink in the middle of the night.  If you have diabetes how are your blood glucose levels ? Now prediabetic A1c around 6. Doctors and she want to get level to 5   ??        SUBSTANCE USE:  {yes/no:310494}     At assessment:  Substance Denies First Use Last Use Route Current Freq. Current Amount Method of Acquiring Notes  ??   Nicotine [x] ??? ?? ?? ?? ?? ?? ?? Never smoker  ??  Use of electronic or other tobacco products [] ? Yes   [x] ? No     Alcohol [x] ??? ?? ?? ?? ?? ?? ?? ??   Cannabis [x] ??? ?? ?? ?? ?? ?? ?? ??   Amphetamines [x] ??? ?? ?? ?? ?? ?? ?? ??   Crack/Cocaine [x] ??? ?? ?? ?? ?? ?? ?? ??   Benzodiazepines [x] ??? ?? ?? ?? ?? ?? ?? ??   Hallucinogens [x] ??? ?? ?? ?? ?? ?? ?? ??   Opioids [x] ??? ?? ?? ?? ?? ?? ?? ??   Inhalants [x] ??? ?? ?? ?? ?? ?? ?? ??   Other (specify) [x] ??? ?? ?? ?? ?? ?? ??                SESSION FOCUS:  Addressed:  1. 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 Grief        First visit since assessment. I provided orientation to counseling session format and week review.    Discussed efforts since last contact.  ***                Today's Problem Solving addressed:    Reviewed patient's efforts and linked impact of effort to   1. 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 Grief        Addressed:          Discussed issues presented and options for continued and/or improved self care management           Today's Mind Body Stress Reduction Skill Training addressed:                Additional:       Medication adherence and barriers to the treatment plan have been addressed. Opportunities to optimize healthy behaviors have been discussed. Patient / caregiver voiced understanding.        ASSESSMENT      ENGAGEMENT: Patient presented a willingness to participate in treatment.  PARTICIPATION QUALITY:    {ddparticipationqualities:22407}  MOOD:  {IHKV:42595}     AFFECT:  {RRAFFECT:24213}   MENTAL STATUS:     {Mental status:20198}  MODES OF INTERVENTION used in this session:    {Blank multiple:29302::Advocacy,Assessment,Clarification,Cognitive Behavioral,Education,Exploration,Linkage facilitation,Mind Body  Stress Reduction Skill Training,Motivational Interviewing,Problem Solving,Support,Reality Testing,Reframing,Week review,Treatment review}      TREATMENT PLAN    Short term goal focused counseling for identified treatment goals:   [] ? Reduce PHQ / GAD by 5 points  [x] ? Get or keep PHQ / GAD under 10 points  [x] ? Stress and self-care management;   [] ? Referral for outside treatment  [] ? Bridge to outside treatment  [x] ? Other: grief??  ??  Patient-stated health goal:   I might be able to target my thought processes into an avenue that isn't so scattered, and not go down the rabbit hole.   Work on how to manage how I pick off and leave things off my plate.    Problem Solving Treatment / Mind Body Stress Reduction Skill Training:  Next visit will be   2 of up to 12 visits.    RCO in {NUMBERS 1-10:18281} weeks.

## 2021-01-06 ENCOUNTER — Telehealth: Admit: 2021-01-06 | Discharge: 2021-01-07 | Payer: PRIVATE HEALTH INSURANCE | Attending: Clinical | Primary: Clinical

## 2021-01-06 DIAGNOSIS — F330 Major depressive disorder, recurrent, mild: Principal | ICD-10-CM

## 2021-01-06 NOTE — Unmapped (Addendum)
INTERNAL MEDICINE COUNSELING SESSION NOTE  ??  LENGTH OF SESSION:  Psychotherapy Session 45 minutes  ??  TYPE OF PSYCHOTHERAPY: Behavioral, Supportive  ??  REASON FOR TREATMENT: Problem Solving Treatment and Mind Body Stress Reduction skill training for depression and grief; link impact of effort to mood.   ??  ??  ??    ??  The patient reports they are currently: at home. I spent 45 minutes on the real-time audio and video with the patient on the date of service. I spent an additional 10 minutes on pre- and post-visit activities on the date of service.   ??  The patient was physically located in [place] or a state in which I am permitted to provide care. The patient and/or parent/guardian understood that s/he may incur co-pays and cost sharing, and agreed to the telemedicine visit. The visit was reasonable and appropriate under the circumstances given the patient's presentation at the time.  ??  The patient and/or parent/guardian has been advised of the potential risks and limitations of this mode of treatment (including, but not limited to, the absence of in-person examination) and has agreed to be treated using telemedicine. The patient's/patient's family's questions regarding telemedicine have been answered.   ??  If the visit was completed in an ambulatory setting, the patient and/or parent/guardian has also been advised to contact their provider???s office for worsening conditions, and seek emergency medical treatment and/or call 911 if the patient deems either necessary.  ??  ??     ??  ??  ??  REVIEW OF FUNCTIONING SINCE LAST VISIT  ??  MOOD (0-10) =  real good    7 of 10 with 10 being the  best.    ??  SATISFACTION WITH EFFORT IN CARING FOR MOOD (0-10) =   5 of 10 with 10 being the  best.      ??  PAIN:              0  of 10 with 10 being the worst.  At last visit was    0  ??  PHQ-9 SCORE:   9  of 27        ??  PHQ-9 PHQ-9 TOTAL SCORE   12/11/2020 11   10/30/2020 2   04/16/2020 13   04/24/2019 3   04/27/2018 7      ??  GAD-7 SCORE:     9  of 21  ??  GAD7 Total Score GAD-7 Total Score   12/11/2020 13      ??  ??  CURRENT SUICIDAL/HOMICIDAL IDEATION:    None  ??  ??  PROBLEM LIST:  ??      Patient Active Problem List   Diagnosis   ??? Anemia   ??? Type 2 diabetes mellitus with microalbuminuria, with long-term current use of insulin (CMS-HCC)   ??? 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 Psoriatic arthritis (CMS-HCC)   ??? Bilateral hearing loss   ??? Hypertension associated with diabetes (CMS-HCC)   ??? Immunosuppressed status (CMS-HCC)   ??? Vitamin D deficiency   ??? Grief   ??  ??  ??  MEDICATION COMPLIANCE:  ??  Taking all medications regularly?  yes   ??  Any changes in medication since last visit?  no   ??  Any missed doses? no   ??  ??         Current Outpatient Medications on File Prior to Visit   Medication  Sig Dispense Refill   ??? atorvastatin (LIPITOR) 80 MG tablet Take 1 tablet (80 mg total) by mouth daily. 90 tablet 3   ??? BD ULTRA-FINE MINI PEN NEEDLE 31 gauge x 3/16 (5 mm) Ndle USE 3 TIMES DAILY 100 each 3   ??? blood sugar diagnostic (GLUCOSE BLOOD) Strp Test blood sugars three times daily. ICD-10 E11.9 100 strip 11   ??? blood-gluc transmitter-sensor Misc 1 each by Miscellaneous route every fourteen (14) days. 6 each 3   ??? blood-glucose meter kit Use as instructed 1 each 0   ??? blood-glucose meter Misc Use to check blood sugars three times daily. ICD10 E11.9 1 each 0   ??? buPROPion (WELLBUTRIN XL) 300 MG 24 hr tablet Take 1 tablet (300 mg total) by mouth every morning. 90 tablet 2   ??? cholecalciferol, vitamin D3-50 mcg, 2,000 unit,, 50 mcg (2,000 unit) cap Take by mouth. ?? ??   ??? clobetasoL (TEMOVATE) 0.05 % external solution Apply topically Two (2) times a day. 50 mL 0   ??? flash glucose sensor (FREESTYLE LIBRE 14 DAY SENSOR) kit by Other route every fourteen (14) days. Please dispense sensors for Freestyle Libre 14 day System. Use to check blood sugars continuously. E11.29 Z79.4. 6 kit 3   ??? FREESTYLE LIBRE 14 DAY READER USE AS DIRECTED 1 each 11   ??? inhalational spacing device Spcr Use with inhaler to maximize medication delivery to lungs 1 each 0   ??? lancets Misc 1 each by Other route once daily. Test daily before breakfast. Dispense 90 day supply. 100 each 3   ??? lisinopriL (PRINIVIL,ZESTRIL) 10 MG tablet Take 1 tablet (10 mg total) by mouth daily. 90 tablet 3   ??? mometasone (ELOCON) 0.1 % lotion Apply with cotton ball to ear canals t bilaterally once every 2weeks 60 mL 0   ??? mupirocin (BACTROBAN) 2 % ointment Apply topically to open areas on arms twice daily as needed. 30 g 4   ??? naproxen (EC NAPROSYN) 500 MG EC tablet Take 1 tablet (500 mg total) by mouth two (2) times a day as needed. 60 tablet 5   ??? pen needle, diabetic (BD ULTRA-FINE NANO PEN NEEDLE) 32 gauge x 5/32 (4 mm) Ndle Needles for insulin and Ozempic pen. Inject daily as instructed. ICD-10 E11.9 100 each 3   ??? secukinumab (COSENTYX PEN, 2 PENS,) 150 mg/mL PnIj injection Inject the contents of 2 pens (300mg  total) under the skin every 28 days 6 mL 3   ??? triamcinolone (KENALOG) 0.1 % ointment Apply topically Two (2) times a day. 30 g 1   ??  No current facility-administered medications on file prior to visit.   ??  ??  ??  ??  SLEEPING:     pretty good She works third shift - able to sleep or nap when patient is sleep.  Getting 6-10 hours of sleep depending on care/work issues.    ??  At assessment:  I'm better at getting sleep than I ever have been.   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  Light sleeper and wakes when her charges wake.  # of hours of sleep:??getting 6-10 hours depending on care issues  5 months ago was sleeping too much  ??  ??  ??  APPETITE:    good not getting sugar cravings  Blood glucose levels if patient has diabetes:  prediabetic A1c around 6. Doctors and she want to get level to 5??  ??  At assessment:  Is  your appetite up, down or the same???Good, and intentionally decreased  Have you lost or gained weight recently???Lost enough weight to get off diabetes medications. Lost 15 lbs in the last 3 months  ??  Caffeine: ??[x] ????If yes how much?: 20 oz of coffee????What is the latest time in the day you have caffeine: 1pm. When works 3rd shift will drink in the middle of the night.  If you have diabetes how are your blood glucose levels ???Now prediabetic A1c around 6. Doctors and she want to get level to 5??  ??  ??  ??  ??  SUBSTANCE USE:  no   ??  At assessment:  Substance Denies First Use Last Use Route Current Freq. Current Amount Method of Acquiring Notes  ??   Nicotine [x] ???? ?? ?? ?? ?? ?? ?? Never smoker  ??  Use of electronic or other tobacco products [] ????Yes ????[x] ????No ??   Alcohol [x] ???? ?? ?? ?? ?? ?? ?? ??   Cannabis [x] ???? ?? ?? ?? ?? ?? ?? ??   Amphetamines [x] ???? ?? ?? ?? ?? ?? ?? ??   Crack/Cocaine [x] ???? ?? ?? ?? ?? ?? ?? ??   Benzodiazepines [x] ???? ?? ?? ?? ?? ?? ?? ??   Hallucinogens [x] ???? ?? ?? ?? ?? ?? ?? ??   Opioids [x] ???? ?? ?? ?? ?? ?? ?? ??   Inhalants [x] ???? ?? ?? ?? ?? ?? ?? ??   Other (specify) [x] ???? ?? ?? ?? ?? ?? ?? ??   ??  ??  ??  ??  ??  ??  SESSION FOCUS:  Addressed:  1. 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  ??  First visit since assessment. I provided orientation to counseling session format and week review.  ??  Discussed efforts since last contact.  I'm feeling different on all levels. improved mood and satisfaction with self-care  Better managed blood sugars, mostly avoid sugar/sugar addiction.  I'm able to do more.  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 I like challenges.  Dietary changes    ??  ??  ??  ????  ??  Today's Problem Solving addressed:  ??  Reviewed patient's efforts and linked impact of effort to   1. 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  ??  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   Tendency to fill her time with activity (care for patient primarily) or thoughts about projects, internet/YouTube searches  ADHD can complicate efforts with projects.   Sometimes she feels good about her efforts. Sometimes she feels stressed by putting so much on her plate.  Clinician used motivational interviewing to explore idea of change related to realistic vs unrealistic expectations and for concept of self compassion vs harsh self criticism  Explored how she made changes to diet and nutrition - likes to look at the big picture rather than just a path.    ??  ??  Discussed issues presented and options for continued and/or improved self care management   Continue: healthy eating, efforts for good sleep around work  Next visit to discuss further 'what the big picture is about creating balance in her life'  ??  ??  ??  Today's Mind Body Stress Reduction Skill Training addressed:  I don't have a space to just rest.   Challenges with focus   Negative self-talk - psychoeducation and discussion about self-compassion   Clinician reflected these and ways to introduce mindful focus with activity for a 'brain rest'   Future to discuss relevance of mindfulness for self compassion  ??  ??  ??  ??  ??  ??  Additional:   ??  ??  Medication adherence and barriers to the treatment plan have been addressed. Opportunities to optimize healthy behaviors have been discussed. Patient / caregiver voiced understanding.  ??  ??  ??  ASSESSMENT    ??  ENGAGEMENT: Patient presented a willingness to participate in treatment.  PARTICIPATION QUALITY:    Active  MOOD:  real good     AFFECT:  REACTIVE   MENTAL STATUS:     alert and oriented  MODES OF INTERVENTION used in this session:    Clarification, Exploration, Mind Body Stress Reduction Skill Training, Motivational Interviewing, Problem Solving, Support, Reframing or Week review  ??  ??  TREATMENT PLAN  ??  Short term goal focused counseling for identified treatment goals:   [] ????Reduce PHQ / GAD by 5 points  [x] ????Get or keep PHQ / GAD under 10 points  [x] ????Stress and self-care management;   [] ????Referral for outside treatment  [] ????Bridge to outside treatment  [x] ????Other: grief??  ??  Patient-stated health goal: ????I might be able to target my thought processes into an avenue??that isn't so scattered, and not go down the rabbit hole.   Work on how to manage how I pick off and leave things off my plate.  ??  Problem Solving Treatment / Mind Body Stress Reduction Skill Training:  Next visit will be        2 of up to 12 visits.  ??  RCO Wed 10/5 at 2pm by [employer]

## 2021-01-11 NOTE — Unmapped (Signed)
Unable to contact pt to schedule a follow up appt with Diane Dolan-Soto since appt on 10/5 at 2pm is not available due to an scheduling error.

## 2021-01-25 NOTE — Unmapped (Signed)
[facility] Refill Coordination Note    Specialty Medication(s) to be Shipped:   Inflammatory Disorders: Cosentyx    Other medication(s) to be shipped: No additional medications requested for fill at this time     Julia Avila, DOB: October 25, 1965  Phone: [phone] (home)       All above HIPAA information was verified with patient.     Was a Nurse, learning disability used for this call? No    Completed refill call assessment today to schedule patient's medication shipment from the [facility] [phone]).  All relevant notes have been reviewed.     Specialty medication(s) and dose(s) confirmed: Regimen is correct and unchanged.   Changes to medications: [name] reports no changes at this time.  Changes to insurance: No  New side effects reported not previously addressed with a pharmacist or physician: None reported  Questions for the pharmacist: No    Confirmed patient received a Conservation officer, historic buildings and a Surveyor, mining with first shipment. The patient will receive a drug information handout for each medication shipped and additional FDA Medication Guides as required.       DISEASE/MEDICATION-SPECIFIC INFORMATION        For patients on injectable medications: Patient currently has 0 doses left.  Next injection is scheduled for 02/03/21.    SPECIALTY MEDICATION ADHERENCE     Medication Adherence    Patient reported X missed doses in the last month: 0  Specialty Medication: Cosentyx  Patient is on additional specialty medications: No  Informant: patient              Were doses missed due to medication being on hold? No    Cosentyx 150mg /ml: Patient has 0 days of medication on hand    REFERRAL TO PHARMACIST     Referral to the pharmacist: Not needed      [facility]     Shipping address confirmed in Epic.     Delivery Scheduled: Yes, Expected medication delivery date: 02/02/21.     Medication will be delivered via UPS to the prescription address in Epic WAM.    Jasper Loser   [facility] Specialty Technician

## 2021-01-27 DIAGNOSIS — R809 Proteinuria, unspecified: Principal | ICD-10-CM

## 2021-01-27 DIAGNOSIS — Z794 Long term (current) use of insulin: Principal | ICD-10-CM

## 2021-01-27 DIAGNOSIS — E1129 Type 2 diabetes mellitus with other diabetic kidney complication: Principal | ICD-10-CM

## 2021-01-27 MED ORDER — BLOOD-GLUCOSE TRANSMITTER-BLOOD-GLUCOSE SENSOR
3 refills | 0 days | Status: CP
Start: 2021-01-27 — End: ?

## 2021-01-29 DIAGNOSIS — E782 Mixed hyperlipidemia: Principal | ICD-10-CM

## 2021-01-29 DIAGNOSIS — I10 Essential (primary) hypertension: Principal | ICD-10-CM

## 2021-01-29 MED ORDER — ATORVASTATIN 80 MG TABLET
ORAL_TABLET | 3 refills | 0.00000 days
Start: 2021-01-29 — End: ?

## 2021-01-29 MED ORDER — LISINOPRIL 10 MG TABLET
ORAL_TABLET | 3 refills | 0.00000 days
Start: 2021-01-29 — End: ?

## 2021-02-01 MED FILL — COSENTYX PEN 300 MG/2 PENS (150 MG/ML) SUBCUTANEOUS: SUBCUTANEOUS | 28 days supply | Qty: 2 | Fill #7

## 2021-02-02 MED ORDER — ATORVASTATIN 80 MG TABLET
ORAL_TABLET | Freq: Every day | ORAL | 2 refills | 90.00000 days | Status: CP
Start: 2021-02-02 — End: 2022-01-06

## 2021-02-02 MED ORDER — LISINOPRIL 10 MG TABLET
ORAL_TABLET | Freq: Every day | ORAL | 2 refills | 90 days | Status: CP
Start: 2021-02-02 — End: 2022-01-06

## 2021-02-03 ENCOUNTER — Telehealth: Admit: 2021-02-03 | Discharge: 2021-02-04 | Payer: PRIVATE HEALTH INSURANCE | Attending: Clinical | Primary: Clinical

## 2021-02-03 DIAGNOSIS — F330 Major depressive disorder, recurrent, mild: Principal | ICD-10-CM

## 2021-02-03 NOTE — Unmapped (Signed)
INTERNAL MEDICINE COUNSELING SESSION NOTE    LENGTH OF SESSION:  Psychotherapy Session 45 minutes    TYPE OF PSYCHOTHERAPY: Behavioral, Supportive    REASON FOR TREATMENT: Problem Solving Treatment and Mind Body Stress Reduction skill training for??depression and grief; link impact of effort to mood.??  ??  ??  ??  ??  ??  The patient reports they are currently: at home. I spent??45??minutes on the real-time audio and video??with the patient on the date of service. I spent an additional??10??minutes on pre- and post-visit activities on the date of service.     The patient was physically located in [place] or a state in which I am permitted to provide care. The patient and/or parent/guardian understood that s/he may incur co-pays and cost sharing, and agreed to the telemedicine visit. The visit was reasonable and appropriate under the circumstances given the patient's presentation at the time.    The patient and/or parent/guardian has been advised of the potential risks and limitations of this mode of treatment (including, but not limited to, the absence of in-person examination) and has agreed to be treated using telemedicine. The patient's/patient's family's questions regarding telemedicine have been answered.     If the visit was completed in an ambulatory setting, the patient and/or parent/guardian has also been advised to contact their provider???s office for worsening conditions, and seek emergency medical treatment and/or call 911 if the patient deems either necessary.               REVIEW OF FUNCTIONING SINCE LAST VISIT    MOOD (0-10) =  good     8  At last visit was  7    SATISFACTION WITH EFFORT IN CARING FOR MOOD (0-10) =    10 of 10 with 10 being the best.  At last visit was   5    PAIN:     0   At last visit was    0    PHQ-9 SCORE:   deferred    PHQ-9 PHQ-9 TOTAL SCORE   01/06/2021 9   12/11/2020 11   10/30/2020 2   04/16/2020 13   04/24/2019 3        GAD-7 SCORE:     deferred    GAD7 Total Score GAD-7 Total Score   01/06/2021 8   12/11/2020 13            CURRENT SUICIDAL/HOMICIDAL IDEATION:   None      PROBLEM LIST:    Patient Active Problem List   Diagnosis   ??? Anemia   ??? Type 2 diabetes mellitus with microalbuminuria, with long-term current use of insulin (CMS-HCC)   ??? 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 Psoriatic arthritis (CMS-HCC)   ??? Bilateral hearing loss   ??? Hypertension associated with diabetes (CMS-HCC)   ??? Immunosuppressed status (CMS-HCC)   ??? Vitamin D deficiency   ??? Grief         MEDICATION COMPLIANCE:     Taking all medications regularly?  yes     Any changes in medication since last visit?  no     Any missed doses? no       Current Outpatient Medications on File Prior to Visit   Medication Sig Dispense Refill   ??? atorvastatin (LIPITOR) 80 MG tablet Take 1 tablet (80 mg total) by mouth daily. 90 tablet 2   ??? BD ULTRA-FINE MINI PEN NEEDLE 31 gauge x 3/16 (5 mm) Ndle USE  3 TIMES DAILY 100 each 3   ??? blood sugar diagnostic (GLUCOSE BLOOD) Strp Test blood sugars three times daily. ICD-10 E11.9 100 strip 11   ??? blood-gluc transmitter-sensor Misc 1 each by Miscellaneous route every fourteen (14) days. 6 each 3   ??? blood-glucose meter kit Use as instructed 1 each 0   ??? blood-glucose meter Misc Use to check blood sugars three times daily. ICD10 E11.9 1 each 0   ??? buPROPion (WELLBUTRIN XL) 300 MG 24 hr tablet Take 1 tablet (300 mg total) by mouth every morning. 90 tablet 2   ??? cholecalciferol, vitamin D3-50 mcg, 2,000 unit,, 50 mcg (2,000 unit) cap Take by mouth.     ??? clobetasoL (TEMOVATE) 0.05 % external solution Apply topically Two (2) times a day. 50 mL 0   ??? flash glucose sensor (FREESTYLE LIBRE 14 DAY SENSOR) kit by Other route every fourteen (14) days. Please dispense sensors for Freestyle Libre 14 day System. Use to check blood sugars continuously. E11.29 Z79.4. 6 kit 3   ??? FREESTYLE LIBRE 14 DAY READER USE AS DIRECTED 1 each 11   ??? inhalational spacing device Spcr Use with inhaler to maximize medication delivery to lungs 1 each 0   ??? lancets Misc 1 each by Other route once daily. Test daily before breakfast. Dispense 90 day supply. 100 each 3   ??? lisinopriL (PRINIVIL,ZESTRIL) 10 MG tablet Take 1 tablet (10 mg total) by mouth daily. 90 tablet 2   ??? mometasone (ELOCON) 0.1 % lotion Apply with cotton ball to ear canals t bilaterally once every 2weeks 60 mL 0   ??? mupirocin (BACTROBAN) 2 % ointment Apply topically to open areas on arms twice daily as needed. 30 g 4   ??? naproxen (EC NAPROSYN) 500 MG EC tablet Take 1 tablet (500 mg total) by mouth two (2) times a day as needed. 60 tablet 5   ??? pen needle, diabetic (BD ULTRA-FINE NANO PEN NEEDLE) 32 gauge x 5/32 (4 mm) Ndle Needles for insulin and Ozempic pen. Inject daily as instructed. ICD-10 E11.9 100 each 3   ??? secukinumab (COSENTYX PEN, 2 PENS,) 150 mg/mL PnIj injection Inject the contents of 2 pens (300mg  total) under the skin every 28 days 6 mL 3   ??? triamcinolone (KENALOG) 0.1 % ointment Apply topically Two (2) times a day. 30 g 1     No current facility-administered medications on file prior to visit.           SLEEPING:    pretty good  She works third shift - able to sleep or nap when patient is sleep. Getting 6 or more hours of sleep around patient care or bathroom.          APPETITE:    great  Blood glucose levels if patient has diabetes: Pre-diabetic, running 70 to 130 and mostly below 100)  She's had DM for about 20 yrs      SUBSTANCE USE:  no         SESSION FOCUS:  Addressed:  1. 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  Continued healthy eating, efforts for good sleep around work  She enjoys her 3rd shift work and changed some 1st shift work that she doesn't like.  She requested to be rotated to another setting where she is less isolated from outside contact or with others  Getting out of the house more  Playing out  in the yard.  Traveled to [facility] for 3 days with a friend.  Scheduled travel            Today's Problem Solving addressed:    Reviewed patient's efforts and linked impact of effort to   1. 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 This is new.  Clinician reflected about sudden impact of loss of her job of 34 yrs  Job had been her foundation of security. Current work has been stepping stones and that has been freeing.  She is finding change more enjoyable. Matilde Haymaker several patients is monotonous.  I went out of my comfort zone. to shift her hours.  Easier for her to set her schedule  Her mother convinced her to travel to [place]. They've already scheduled to travel.  I used to live to pay the bills.  Explored and discussed the steps she's taking and the freeing experience she's having.  ??    Discussed issues presented and options for continued and/or improved self care management   Travel to [place] and will be staying there for 2 wks        Additional:     Medication adherence and barriers to the treatment plan have been addressed. Opportunities to optimize healthy behaviors have been discussed. Patient / caregiver voiced understanding.          ASSESSMENT      ENGAGEMENT: Patient presented a willingness to participate in treatment.  PARTICIPATION QUALITY:    Active  MOOD:  good     AFFECT:  CONGRUENT   MENTAL STATUS:     alert and oriented  MODES OF INTERVENTION used in this session:    Clarification, Exploration, Problem Solving, Support, Reframing or Week review      TREATMENT PLAN    Short term goal focused counseling for identified treatment goals:   [] ?????Reduce PHQ / GAD by 5 points  [x] ?????Get or keep PHQ / GAD under 10 points  [x] ?????Stress and self-care management;   [] ?????Referral for outside treatment  [] ?????Bridge to outside treatment  [x] ?????Other: grief??  ??  Patient-stated health goal: ????I might be able to target my thought processes into an avenue??that isn't so scattered, and not go down the rabbit hole.   Work on how to manage how I pick off and leave things off my plate.    Problem Solving Treatment / Mind Body Stress Reduction Skill Training:  Next visit will be   3 of up to 12 visits.    RCO Wed 11/30 at 3pm  By MyChart video

## 2021-02-04 MED ORDER — GABAPENTIN 100 MG CAPSULE
ORAL_CAPSULE | Freq: Three times a day (TID) | ORAL | 3 refills | 90.00000 days
Start: 2021-02-04 — End: 2022-02-04

## 2021-02-04 NOTE — Unmapped (Unsigned)
Internal Medicine Clinic Visit    Reason for visit: Routine follow-up    A/P:    T2DM: A1C is ***% today off all medications  -Freestyle libre for BG monitoring  -CTM need to resume medications. Would resume Ozempic first given prior intolerance to Metformin (diarrhea).     HTN, microalbuminuria: BP well controlled today on Lisinopril therapy.   -Lisinopril 10 mg daily  -Urine Alb/Cr today  -CTM    Psoriatic Arthritis: Some continued back pain, but feeling well otherwise without acute joint complaints and skin disease under great control with Cosentyx.  -Cosentyx 300mg  SubQ monthly  -Continue topical agents as needed.  -Received 5 doses of COVID vaccine and Evusheld    Depression: Reports continued intermittent days with depressed mood.   - Bupropion XL to 300mg  daily  - [facility] follow up with [facility] IM counseling    Elevated TSH: TSH has remained normal off Levothyroxine.   TSH   Date Value Ref Range Status   06/17/2020 4.240 0.550 - 4.780 uIU/mL Final   10/03/2019 4.690 (H) 0.450 - 4.500 uIU/mL Final   -CTM    HLD:   -Atorvastatin 40mg  daily    HCM:   Eye exam   HTN 3 today  Flu, PCV20      Diagnoses and all orders for this visit:    Type 2 diabetes mellitus with microalbuminuria, with long-term current use of insulin (CMS-HCC)    Healthcare maintenance    Hypertension associated with diabetes (CMS-HCC)    _________________________________________________________    HPI:  Julia Avila is a 55 yo woman with a history of T2DM, HTN, PsA and PsA arthritis, depression who presents for routine follow-up.     ***      ROS otherwise negative.   _________________________________________________________  Problem List:  Patient Active Problem List   Diagnosis   ??? Anemia   ??? Type 2 diabetes mellitus with microalbuminuria, with long-term current use of insulin (CMS-HCC)   ??? 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 Psoriatic arthritis (CMS-HCC)   ??? Bilateral hearing loss   ??? Hypertension associated with diabetes (CMS-HCC)   ??? Immunosuppressed status (CMS-HCC)   ??? Vitamin D deficiency   ??? Grief       Medications:  Reviewed in EPIC  __________________________________________________________    Physical Exam:   Vital Signs:  There were no vitals filed for this visit.    Gen: Well appearing, NAD  Mouth: Clear oropharynx, no erythema or exudate, no evidence of thrush.   CV: RRR, no murmurs  Pulm: CTA bilaterally, no crackles or wheezes  Abd: Soft, NTND, normal BS. No HSM.  Ext: No edema  Skin: No rashes on exposed skin.

## 2021-03-05 NOTE — Unmapped (Signed)
[facility] Refill Coordination Note    Specialty Medication(s) to be Shipped:   Inflammatory Disorders: Cosentyx    Other medication(s) to be shipped: No additional medications requested for fill at this time     Julia Avila, DOB: 1965-12-06  Phone: [phone] (home)       All above HIPAA information was verified with patient.     Was a Nurse, learning disability used for this call? No    Completed refill call assessment today to schedule patient's medication shipment from the [facility] [phone]).  All relevant notes have been reviewed.     Specialty medication(s) and dose(s) confirmed: Regimen is correct and unchanged.   Changes to medications: December reports no changes at this time.  Changes to insurance: No  New side effects reported not previously addressed with a pharmacist or physician: None reported  Questions for the pharmacist: No    Confirmed patient received a Conservation officer, historic buildings and a Surveyor, mining with first shipment. The patient will receive a drug information handout for each medication shipped and additional FDA Medication Guides as required.       DISEASE/MEDICATION-SPECIFIC INFORMATION        For patients on injectable medications: Patient currently has 0 doses left.  Next injection is scheduled for 11/23.    SPECIALTY MEDICATION ADHERENCE     Medication Adherence    Patient reported X missed doses in the last month: 0  Specialty Medication: Cosentyx  Patient is on additional specialty medications: No  Patient is on more than two specialty medications: No  Any gaps in refill history greater than 2 weeks in the last 3 months: no  Demonstrates understanding of importance of adherence: yes  Informant: patient              Were doses missed due to medication being on hold? No    Cosentyx 150mg /ml: Patient has 0 days of medication on hand     REFERRAL TO PHARMACIST     Referral to the pharmacist: Not needed      [facility]     Shipping address confirmed in Epic. Delivery Scheduled: Yes, Expected medication delivery date: 11/22.     Medication will be delivered via UPS to the prescription address in Epic WAM.    Olga Millers   [facility] Specialty Technician

## 2021-03-08 MED FILL — COSENTYX PEN 300 MG/2 PENS (150 MG/ML) SUBCUTANEOUS: SUBCUTANEOUS | 28 days supply | Qty: 2 | Fill #8

## 2021-03-17 ENCOUNTER — Telehealth: Admit: 2021-03-17 | Discharge: 2021-03-18 | Payer: PRIVATE HEALTH INSURANCE | Attending: Clinical | Primary: Clinical

## 2021-03-17 DIAGNOSIS — F330 Major depressive disorder, recurrent, mild: Principal | ICD-10-CM

## 2021-03-17 NOTE — Unmapped (Signed)
INTERNAL MEDICINE COUNSELING SESSION NOTE    LENGTH OF SESSION:  Psychotherapy Session 45 minutes    TYPE OF PSYCHOTHERAPY: Behavioral, Supportive    REASON FOR TREATMENT: Problem Solving Treatment and Mind Body Stress Reduction skill training for??depression and grief; link impact of effort to mood.??  ??  ??  ??  ??  ??  The patient reports they are currently:??at home. I spent??45??minutes on the??real-time audio and video??with the patient on the date of service. I spent an additional??10??minutes on pre- and post-visit activities on the date of service.     The patient was physically located in [place] or a state in which I am permitted to provide care. The patient and/or parent/guardian understood that s/he may incur co-pays and cost sharing, and agreed to the telemedicine visit. The visit was reasonable and appropriate under the circumstances given the patient's presentation at the time.    The patient and/or parent/guardian has been advised of the potential risks and limitations of this mode of treatment (including, but not limited to, the absence of in-person examination) and has agreed to be treated using telemedicine. The patient's/patient's family's questions regarding telemedicine have been answered.     If the visit was completed in an ambulatory setting, the patient and/or parent/guardian has also been advised to contact their provider???s office for worsening conditions, and seek emergency medical treatment and/or call 911 if the patient deems either necessary.               REVIEW OF FUNCTIONING SINCE LAST VISIT    MOOD (0-10) =  good today, but has recent anxiety with being head of household and roof problems    5  At last visit was  8    SATISFACTION WITH EFFORT IN CARING FOR MOOD (0-10) =    5 of 10 with 10 being the best.  At last visit was   10    PAIN:     0   At last visit was    0    PHQ-9 SCORE:    deferred    PHQ-9 PHQ-9 TOTAL SCORE   01/06/2021 9   12/11/2020 11   10/30/2020 2   04/16/2020 13 04/24/2019 3        GAD-7 SCORE:   deferred    GAD7 Total Score GAD-7 Total Score   01/06/2021 8   12/11/2020 13            CURRENT SUICIDAL/HOMICIDAL IDEATION:   None      PROBLEM LIST:    Patient Active Problem List   Diagnosis   ??? Anemia   ??? Type 2 diabetes mellitus with microalbuminuria, with long-term current use of insulin (CMS-HCC)   ??? 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 Psoriatic arthritis (CMS-HCC)   ??? Bilateral hearing loss   ??? Hypertension associated with diabetes (CMS-HCC)   ??? Immunosuppressed status (CMS-HCC)   ??? Vitamin D deficiency   ??? Grief         MEDICATION COMPLIANCE:     Taking all medications regularly?  no     Any changes in medication since last visit?  no     Any missed doses? yes missed 3x each week      Current Outpatient Medications on File Prior to Visit   Medication Sig Dispense Refill   ??? atorvastatin (LIPITOR) 80 MG tablet Take 1 tablet (80 mg total) by mouth daily. 90 tablet 2   ??? BD ULTRA-FINE MINI  PEN NEEDLE 31 gauge x 3/16 (5 mm) Ndle USE 3 TIMES DAILY 100 each 3   ??? blood sugar diagnostic (GLUCOSE BLOOD) Strp Test blood sugars three times daily. ICD-10 E11.9 100 strip 11   ??? blood-gluc transmitter-sensor Misc 1 each by Miscellaneous route every fourteen (14) days. 6 each 3   ??? blood-glucose meter kit Use as instructed 1 each 0   ??? blood-glucose meter Misc Use to check blood sugars three times daily. ICD10 E11.9 1 each 0   ??? buPROPion (WELLBUTRIN XL) 300 MG 24 hr tablet Take 1 tablet (300 mg total) by mouth every morning. 90 tablet 2   ??? cholecalciferol, vitamin D3-50 mcg, 2,000 unit,, 50 mcg (2,000 unit) cap Take by mouth.     ??? clobetasoL (TEMOVATE) 0.05 % external solution Apply topically Two (2) times a day. 50 mL 0   ??? flash glucose sensor (FREESTYLE LIBRE 14 DAY SENSOR) kit by Other route every fourteen (14) days. Please dispense sensors for Freestyle Libre 14 day System. Use to check blood sugars continuously. E11.29 Z79.4. 6 kit 3   ??? FREESTYLE LIBRE 14 DAY READER USE AS DIRECTED 1 each 11   ??? inhalational spacing device Spcr Use with inhaler to maximize medication delivery to lungs 1 each 0   ??? lancets Misc 1 each by Other route once daily. Test daily before breakfast. Dispense 90 day supply. 100 each 3   ??? lisinopriL (PRINIVIL,ZESTRIL) 10 MG tablet Take 1 tablet (10 mg total) by mouth daily. 90 tablet 2   ??? mometasone (ELOCON) 0.1 % lotion Apply with cotton ball to ear canals t bilaterally once every 2weeks 60 mL 0   ??? mupirocin (BACTROBAN) 2 % ointment Apply topically to open areas on arms twice daily as needed. 30 g 4   ??? pen needle, diabetic (BD ULTRA-FINE NANO PEN NEEDLE) 32 gauge x 5/32 (4 mm) Ndle Needles for insulin and Ozempic pen. Inject daily as instructed. ICD-10 E11.9 100 each 3   ??? secukinumab (COSENTYX PEN, 2 PENS,) 150 mg/mL PnIj injection Inject the contents of 2 pens (300mg  total) under the skin every 28 days 6 mL 3   ??? triamcinolone (KENALOG) 0.1 % ointment Apply topically Two (2) times a day. 30 g 1     No current facility-administered medications on file prior to visit.           SLEEPING:    good most nights  She works third shift - able to sleep or nap when patient is sleep.??Getting 6 or more hours of sleep around patient care or bathroom.          APPETITE:    good  Blood glucose levels if patient has diabetes: Ms. Winslow says her glucose levels have been well controlled        SUBSTANCE USE:  no         SESSION FOCUS:          Today's Problem Solving addressed:    Reviewed patient's efforts and linked impact of effort to   1. 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  She had come off of other medication and thought she could lower and stop buproprion  Worry has been harder to manage  Discussed worry and rumination as part of depression  Clinician helped patient to reflect  on impact of her purposely reducing her medications  On reflection she's noticed the need for buproprion.    CBTi  Worrying at night in bed.  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 Reviewed types and discussed styles observed by and used by patient.   Fortune telling  Disqualifying the positive  Magnification    I provided education about impact on mood and functioning and benefit of catching, correcting and changing unhelpful thoughts. Discussed approaches and means for doing this.      I introduced and we discussed use of a 7 column thought record for analysis of thoughts and emotions and walked through experience of worry about the roof        Discussed issues presented and options for continued and/or improved self care management   Restart taking buproprion regularly  Make an appointment to worry after getting sleep  Keep a pad and pen by the bed  7 column thought record - plan to use this before next session and may send a copy via MyChart  Unhelpful thinking styles      Resources/information:  CBTi - Make an appointment to worry after getting sleep  7 column thought record  Unhelpful thinking styles      Additional:     Medication adherence and barriers to the treatment plan have been addressed. Opportunities to optimize healthy behaviors have been discussed. Patient / caregiver voiced understanding.          ASSESSMENT      ENGAGEMENT: Patient presented a willingness to participate in treatment.  PARTICIPATION QUALITY:    Active  MOOD:  good today, but has recent anxiety with being head of household and roof problems     AFFECT:  CONGRUENT   MENTAL STATUS:     alert, oriented and concerned  MODES OF INTERVENTION used in this session:    Assessment, Clarification, Cognitive Behavioral, Education, Exploration, Problem Solving, Support, Reframing or Week review      TREATMENT PLAN    Short term goal focused counseling for identified treatment goals:   [] ??????Reduce PHQ / GAD by 5 points  [x] ??????Get or keep PHQ / GAD under 10 points  [x] ??????Stress and self-care management;   [] ??????Referral for outside treatment  [] ??????Bridge to outside treatment  [x] ??????Other: grief??  ??  Patient-stated health goal: ????I might be able to target my thought processes into an avenue??that isn't so scattered, and not go down the rabbit hole.   Work on how to manage how I pick off and leave things off my plate.  Problem Solving Treatment / Mind Body Stress Reduction Skill Training:  Next visit will be   2 of up to 12 visits.    RCO Wed 12/21/2 at 10am by MyChart video

## 2021-03-26 DIAGNOSIS — Z794 Long term (current) use of insulin: Principal | ICD-10-CM

## 2021-03-26 DIAGNOSIS — E1129 Type 2 diabetes mellitus with other diabetic kidney complication: Principal | ICD-10-CM

## 2021-03-26 DIAGNOSIS — R809 Proteinuria, unspecified: Principal | ICD-10-CM

## 2021-03-26 DIAGNOSIS — E1159 Type 2 diabetes mellitus with other circulatory complications: Principal | ICD-10-CM

## 2021-03-26 DIAGNOSIS — I152 Hypertension secondary to endocrine disorders: Principal | ICD-10-CM

## 2021-04-01 MED ORDER — VITAMIN D3 50 MCG (2,000 UNIT) CAPSULE
ORAL_CAPSULE | 0 refills | 0 days
Start: 2021-04-01 — End: ?

## 2021-04-02 MED ORDER — VITAMIN D3 50 MCG (2,000 UNIT) CAPSULE
ORAL_CAPSULE | 0 refills | 0 days | Status: CP
Start: 2021-04-02 — End: ?

## 2021-04-05 NOTE — Unmapped (Signed)
[facility] Clinical Assessment & Refill Coordination Note    Julia Avila, DOB: [birth year]  Phone: [phone] (home)     All above HIPAA information was verified with patient.     Was a Nurse, learning disability used for this call? No    Specialty Medication(s):   Inflammatory Disorders: Cosentyx     Current Outpatient Medications   Medication Sig Dispense Refill   ??? atorvastatin (LIPITOR) 80 MG tablet Take 1 tablet (80 mg total) by mouth daily. 90 tablet 2   ??? BD ULTRA-FINE MINI PEN NEEDLE 31 gauge x 3/16 (5 mm) Ndle USE 3 TIMES DAILY 100 each 3   ??? blood sugar diagnostic (GLUCOSE BLOOD) Strp Test blood sugars three times daily. ICD-10 E11.9 100 strip 11   ??? blood-gluc transmitter-sensor Misc 1 each by Miscellaneous route every fourteen (14) days. 6 each 3   ??? blood-glucose meter kit Use as instructed 1 each 0   ??? blood-glucose meter Misc Use to check blood sugars three times daily. ICD10 E11.9 1 each 0   ??? buPROPion (WELLBUTRIN XL) 300 MG 24 hr tablet Take 1 tablet (300 mg total) by mouth every morning. 90 tablet 2   ??? clobetasoL (TEMOVATE) 0.05 % external solution Apply topically Two (2) times a day. 50 mL 0   ??? flash glucose sensor (FREESTYLE LIBRE 14 DAY SENSOR) kit by Other route every fourteen (14) days. Please dispense sensors for Freestyle Libre 14 day System. Use to check blood sugars continuously. E11.29 Z79.4. 6 kit 3   ??? FREESTYLE LIBRE 14 DAY READER USE AS DIRECTED 1 each 11   ??? inhalational spacing device Spcr Use with inhaler to maximize medication delivery to lungs 1 each 0   ??? lancets Misc 1 each by Other route once daily. Test daily before breakfast. Dispense 90 day supply. 100 each 3   ??? lisinopriL (PRINIVIL,ZESTRIL) 10 MG tablet Take 1 tablet (10 mg total) by mouth daily. 90 tablet 2   ??? mometasone (ELOCON) 0.1 % lotion Apply with cotton ball to ear canals t bilaterally once every 2weeks 60 mL 0   ??? mupirocin (BACTROBAN) 2 % ointment Apply topically to open areas on arms twice daily as needed. 30 g 4   ??? pen needle, diabetic (BD ULTRA-FINE NANO PEN NEEDLE) 32 gauge x 5/32 (4 mm) Ndle Needles for insulin and Ozempic pen. Inject daily as instructed. ICD-10 E11.9 100 each 3   ??? secukinumab (COSENTYX PEN, 2 PENS,) 150 mg/mL PnIj injection Inject the contents of 2 pens (300mg  total) under the skin every 28 days 6 mL 3   ??? triamcinolone (KENALOG) 0.1 % ointment Apply topically Two (2) times a day. 30 g 1   ??? VITAMIN D3 50 mcg (2,000 unit) cap TAKE 1 CAPSULE BY MOUTH DAILY 320 capsule 0     No current facility-administered medications for this visit.        Changes to medications: [name] reports no changes at this time.    Allergies   Allergen Reactions   ??? Nitrofurantoin Macrocrystal Rash     Large red welts all over her body   ??? Metformin Diarrhea       Changes to allergies: No    SPECIALTY MEDICATION ADHERENCE     Cosentyx 150 mg/ml: 0 days of medicine on hand     Medication Adherence    Patient reported X missed doses in the last month: 0  Specialty Medication: Cosentyx q 28d  Patient is on  additional specialty medications: No  Informant: patient  Confirmed plan for next specialty medication refill: delivery by pharmacy  Refills needed for supportive medications: not needed          Specialty medication(s) dose(s) confirmed: Regimen is correct and unchanged.     Are there any concerns with adherence? No    Adherence counseling provided? Not needed    CLINICAL MANAGEMENT AND INTERVENTION      Clinical Benefit Assessment:    Do you feel the medicine is effective or helping your condition? Yes    Clinical Benefit counseling provided? Not needed    Adverse Effects Assessment:    Are you experiencing any side effects? No    Are you experiencing difficulty administering your medicine? No    Quality of Life Assessment:    Quality of Life    Rheumatology  1. What impact has your specialty medication had on the reduction of your daily pain level?: Tremendous  2. What impact has your specialty medication had on your ability to complete daily tasks (prepare meals, get dressed, etc...)?Marland Kitchen Tremendous  Oncology  Dermatology  Cystic Fibrosis            Have you discussed this with your provider? Not needed    Acute Infection Status:    Acute infections noted within Epic:  No active infections  Patient reported infection: None    Therapy Appropriateness:    Is therapy appropriate and patient progressing towards therapeutic goals? Yes, therapy is appropriate and should be continued    DISEASE/MEDICATION-SPECIFIC INFORMATION      For patients on injectable medications: Patient currently has 0 doses left.  Next injection is scheduled for 04/07/2021.    PATIENT SPECIFIC NEEDS     - Does the patient have any physical, cognitive, or cultural barriers? No    - Is the patient high risk? No    - Does the patient require a Care Management Plan? No     SOCIAL DETERMINANTS OF HEALTH     At the [facility], we have learned that life circumstances - like trouble affording food, housing, utilities, or transportation can affect the health of many of our patients.   That is why we wanted to ask: are you currently experiencing any life circumstances that are negatively impacting your health and/or quality of life? No    Social Determinants of Health     Food Insecurity: No Food Insecurity   ??? Worried About Programme researcher, broadcasting/film/video in the Last Year: Never true   ??? Ran Out of Food in the Last Year: Never true   Tobacco Use: Low Risk    ??? Smoking Tobacco Use: Never   ??? Smokeless Tobacco Use: Never   ??? Passive Exposure: Not on file   Transportation Needs: No Transportation Needs   ??? Lack of Transportation (Medical): No   ??? Lack of Transportation (Non-Medical): No   Alcohol Use: Not At Risk   ??? How often do you have a drink containing alcohol?: Never   ??? How many drinks containing alcohol do you have on a typical day when you are drinking?: 1 - 2   ??? How often do you have 5 or more drinks on one occasion?: Never   Housing/Utilities: Low Risk    ??? Within the past 12 months, have you ever stayed: outside, in a car, in a tent, in an overnight shelter, or temporarily in someone else's home (i.e. couch-surfing)?: No   ??? Are you worried  about losing your housing?: No   ??? Within the past 12 months, have you been unable to get utilities (heat, electricity) when it was really needed?: No   Substance Use: Low Risk    ??? Taken prescription drugs for non-medical reasons: Never   ??? Taken illegal drugs: Never   ??? Patient indicated they have taken drugs in the past year for non-medical reasons: Yes, [positive answer(s)]: Not on file   Financial Resource Strain: Low Risk    ??? Difficulty of Paying Living Expenses: Not very hard   Physical Activity: Not on file   Health Literacy: Not on file   Stress: Not on file   Intimate Partner Violence: Not At Risk   ??? Fear of Current or Ex-Partner: No   ??? Emotionally Abused: No   ??? Physically Abused: No   ??? Sexually Abused: No   Depression: Not at risk   ??? PHQ-2 Score: 2   Social Connections: Not on file       Would you be willing to receive help with any of the needs that you have identified today? Not applicable       SHIPPING     Specialty Medication(s) to be Shipped:   Inflammatory Disorders: Cosentyx    Other medication(s) to be shipped: No additional medications requested for fill at this time     Changes to insurance: No    Delivery Scheduled: Yes, Expected medication delivery date: 04/07/2021.     Medication will be delivered via UPS to the confirmed prescription address in [facility].    The patient will receive a drug information handout for each medication shipped and additional FDA Medication Guides as required.  Verified that patient has previously received a Conservation officer, historic buildings and a Surveyor, mining.    The patient or caregiver noted above participated in the development of this care plan and knows that they can request review of or adjustments to the care plan at any time.      All of the patient's questions and concerns have been addressed.    Nonie Hoyer, PharmD  PGY1 Community-based Pharmacy Resident  [facility] - Specialty Pharmacy

## 2021-04-05 NOTE — Unmapped (Signed)
Error

## 2021-04-06 MED FILL — COSENTYX PEN 300 MG/2 PENS (150 MG/ML) SUBCUTANEOUS: SUBCUTANEOUS | 28 days supply | Qty: 2 | Fill #9

## 2021-04-20 ENCOUNTER — Ambulatory Visit: Admit: 2021-04-20 | Discharge: 2021-04-21 | Payer: PRIVATE HEALTH INSURANCE

## 2021-04-20 DIAGNOSIS — L4050 Arthropathic psoriasis, unspecified: Principal | ICD-10-CM

## 2021-04-20 MED ORDER — COSENTYX PEN 300 MG/2 PENS (150 MG/ML) SUBCUTANEOUS
SUBCUTANEOUS | 3 refills | 84 days | Status: CP
Start: 2021-04-20 — End: ?
  Filled 2021-05-06: qty 2, 28d supply, fill #0

## 2021-04-20 NOTE — Unmapped (Signed)
REASON FOR VISIT: f/u psoriatic arthritis    HISTORY: Julia Avila is a 56 y.o. female with hx of psoriasis (since childhood) and psoriatic arthritis. Has had peripheral and axial involvement.   Treatment hx:   - mtx w/o relief  - Enbrel with good results for 9 years, then loss of efficacy  - Enbrel changed to humira in 2015, gaps in therapy due to recurrent UTIs, found to have nephrolithiasis s/p lithotripsy and ureteral dilation.   Henderson [place] w/o efficacy  - Simponi w/o efficacy  - Cosentyx since 2016.   - Current med regimen: cosentyx 300 mg q 28 days monotherapy     Interim history:  Presents today for follow-up.    She feels that things are generally stable, but has had more pain in her hands x 1 mo or so. The pain is mild. AM stiffness lasting several minutes. No swelling of joints.   She has had intentional wt loss, and since then has noted that she feels cold all the time, whereas before she was very hot natured. She wonders if this being cold all the time has exacerbated her pain. We discussed recommendation to see her PCP about feeling cold all the time, may need thyroid studies checked.     Intermittent pain over the L achilles tendon insertion. Happens suddenly. Also some numbness localized to this area, has this numbness today, but no tenderness.     No fevers, chills. Has had intentional wt loss. No interim illness or hospitalization.     CURRENT MEDICATIONS:  Current Outpatient Medications   Medication Sig Dispense Refill   ??? atorvastatin (LIPITOR) 80 MG tablet Take 1 tablet (80 mg total) by mouth daily. 90 tablet 2   ??? BD ULTRA-FINE MINI PEN NEEDLE 31 gauge x 3/16 (5 mm) Ndle USE 3 TIMES DAILY 100 each 3   ??? blood sugar diagnostic (GLUCOSE BLOOD) Strp Test blood sugars three times daily. ICD-10 E11.9 100 strip 11   ??? blood-gluc transmitter-sensor Misc 1 each by Miscellaneous route every fourteen (14) days. 6 each 3   ??? blood-glucose meter Misc Use to check blood sugars three times daily. ICD10 E11.9 1 each 0   ??? buPROPion (WELLBUTRIN XL) 300 MG 24 hr tablet Take 1 tablet (300 mg total) by mouth every morning. 90 tablet 2   ??? clobetasoL (TEMOVATE) 0.05 % external solution Apply topically Two (2) times a day. 50 mL 0   ??? flash glucose sensor (FREESTYLE LIBRE 14 DAY SENSOR) kit by Other route every fourteen (14) days. Please dispense sensors for Freestyle Libre 14 day System. Use to check blood sugars continuously. E11.29 Z79.4. 6 kit 3   ??? inhalational spacing device Spcr Use with inhaler to maximize medication delivery to lungs 1 each 0   ??? lisinopriL (PRINIVIL,ZESTRIL) 10 MG tablet Take 1 tablet (10 mg total) by mouth daily. 90 tablet 2   ??? mometasone (ELOCON) 0.1 % lotion Apply with cotton ball to ear canals t bilaterally once every 2weeks 60 mL 0   ??? mupirocin (BACTROBAN) 2 % ointment Apply topically to open areas on arms twice daily as needed. 30 g 4   ??? pen needle, diabetic (BD ULTRA-FINE NANO PEN NEEDLE) 32 gauge x 5/32 (4 mm) Ndle Needles for insulin and Ozempic pen. Inject daily as instructed. ICD-10 E11.9 100 each 3   ??? secukinumab (COSENTYX PEN, 2 PENS,) 150 mg/mL PnIj injection Inject the contents of 2 pens (300mg  total) under the skin every 28 days 6 mL  3   ??? triamcinolone (KENALOG) 0.1 % ointment Apply topically Two (2) times a day. 30 g 1   ??? VITAMIN D3 50 mcg (2,000 unit) cap TAKE 1 CAPSULE BY MOUTH DAILY 320 capsule 0   ??? blood-glucose meter kit Use as instructed 1 each 0   ??? FREESTYLE LIBRE 14 DAY READER USE AS DIRECTED 1 each 11   ??? lancets Misc 1 each by Other route once daily. Test daily before breakfast. Dispense 90 day supply. (Patient not taking: Reported on 04/20/2021) 100 each 3     No current facility-administered medications for this visit.       Past Medical History:   Diagnosis Date   ??? Diabetes mellitus (CMS-HCC)    ??? Essential hypertension 01/09/2017   ??? Hypertension    ??? Hypothyroidism    ??? Nephrolithiasis    ??? Psoriasis     Typically on ears and scalp and around eyelids (arthritis is dominant finding typically for patient)   ??? Psoriatic arthritis (CMS-HCC)     Embril for 7 years prior to starting Humara; hips, shoulders, back, hands sometimes   ??? Vitamin D deficiency         Record Review: Available records were reviewed, including pertinent office visits, labs, and imaging.      REVIEW OF SYSTEMS: Ten system were reviewed and negative except as noted above.    PHYSICAL EXAM:  VITAL SIGNS:   Vitals:    04/20/21 1511   BP: 142/87   BP Site: L Arm   BP Position: Sitting   BP Cuff Size: Medium   Pulse: 74   Temp: 36.1 ??C (97 ??F)   TempSrc: Temporal   Weight: 74 kg (163 lb 3.2 oz)   Height: 154.9 cm (5' 1)     General:   Pleasant 56 y.o.female in no acute distress, WDWN   Cardiovascular:  Regular rate and rhythm. No murmur, rub, or gallop. No lower extremity edema.    Lungs:  Clear to auscultation.Normal respiratory effort.    Musculoskeletal:   General: Ambulates w/o assistance   Hands: Bony enlargement of scattered DIPs.  No swelling. Tenderness all DIPs on R and PIP 3-4 on L.  Able to make a tight fist laterally.  Wrists:FROM w/o swelling or tenderness   Elbows: FROM w/o swelling or tenderness   Shoulders: FROM w/o pain   Spine: negative FABER. Occiput to wall 0 cm. Modified schober with 6 cm difference.   Hips: FROM w/o pain   Knees: FROM w/o effusions.  Crepitus on range of motion.  Ankles: No tenderness or swelling of achilles tendon insertions.   Feet: No pain with MTP squeeze    Psych:  Appropriate affect and mood   Skin:  No rashes.  Skin on finger DIPs dry and cracked.          ASSESSMENT/PLAN:  1. Psoriatic arthritis (CMS-HCC)  Stable appearing. Continue cosentyx 300 mg q 28 days.       HCM:   - PCV13 Status: 05/31/2019  - PPSV 23 Status: 09/09/14  -COVID-19 vaccine: Pfizer 06/21/2019, 07/12/2019, 12/04/2019, 06/19/2020.  Moderna 11/18/2020. Offered bivalent booster today, but we did not have pfizer, which she preferred.   - Annual Influenza vaccine. Status: 04/20/21  - Bone health: not on prednisone   - Contraception: Postmenopausal        Return appointment in 7 mo with Dr Scarlette Calico    Greater than 30 minutes spent in visit with patient, including pre and postvisit activities.

## 2021-05-05 NOTE — Unmapped (Signed)
[facility] Refill Coordination Note    Specialty Medication(s) to be Shipped:   Inflammatory Disorders: Cosentyx    Other medication(s) to be shipped: No additional medications requested for fill at this time     DAVIS VANNATTER, DOB: 1966/04/17  Phone: [phone] (home)       All above HIPAA information was verified with patient.     Was a Nurse, learning disability used for this call? No    Completed refill call assessment today to schedule patient's medication shipment from the [facility] [phone]).  All relevant notes have been reviewed.     Specialty medication(s) and dose(s) confirmed: Regimen is correct and unchanged.   Changes to medications: Grady reports no changes at this time.  Changes to insurance: No  New side effects reported not previously addressed with a pharmacist or physician: None reported  Questions for the pharmacist: No    Confirmed patient received a Conservation officer, historic buildings and a Surveyor, mining with first shipment. The patient will receive a drug information handout for each medication shipped and additional FDA Medication Guides as required.       DISEASE/MEDICATION-SPECIFIC INFORMATION        For patients on injectable medications: Patient currently has 0 doses left.  Next injection is scheduled for 1/20.    SPECIALTY MEDICATION ADHERENCE     Medication Adherence    Patient reported X missed doses in the last month: 0  Specialty Medication: Cosentyx  Patient is on additional specialty medications: No  Patient is on more than two specialty medications: No  Any gaps in refill history greater than 2 weeks in the last 3 months: no  Demonstrates understanding of importance of adherence: yes  Informant: patient              Were doses missed due to medication being on hold? No    Cosentyx 150mg /ml: Patient has 0 days of medication on hand     REFERRAL TO PHARMACIST     Referral to the pharmacist: Not needed      [facility]     Shipping address confirmed in Epic. Delivery Scheduled: Yes, Expected medication delivery date: 1/20.     Medication will be delivered via UPS to the prescription address in Epic WAM.    Olga Millers   [facility] Specialty Technician

## 2021-05-14 ENCOUNTER — Ambulatory Visit: Admit: 2021-05-14 | Discharge: 2021-05-15 | Payer: PRIVATE HEALTH INSURANCE

## 2021-05-14 DIAGNOSIS — E039 Hypothyroidism, unspecified: Principal | ICD-10-CM

## 2021-05-14 DIAGNOSIS — F32A Depression, unspecified depression type: Principal | ICD-10-CM

## 2021-05-14 DIAGNOSIS — I10 Essential (primary) hypertension: Principal | ICD-10-CM

## 2021-05-14 DIAGNOSIS — R809 Proteinuria, unspecified: Principal | ICD-10-CM

## 2021-05-14 DIAGNOSIS — E782 Mixed hyperlipidemia: Principal | ICD-10-CM

## 2021-05-14 DIAGNOSIS — Z0000 Encounter for general adult medical examination without abnormal findings: Principal | ICD-10-CM

## 2021-05-14 DIAGNOSIS — Z23 Encounter for immunization: Principal | ICD-10-CM

## 2021-05-14 DIAGNOSIS — Z794 Long term (current) use of insulin: Principal | ICD-10-CM

## 2021-05-14 DIAGNOSIS — E559 Vitamin D deficiency, unspecified: Principal | ICD-10-CM

## 2021-05-14 DIAGNOSIS — E1129 Type 2 diabetes mellitus with other diabetic kidney complication: Principal | ICD-10-CM

## 2021-05-14 DIAGNOSIS — L4050 Arthropathic psoriasis, unspecified: Principal | ICD-10-CM

## 2021-05-14 DIAGNOSIS — D849 Immunodeficiency, unspecified: Principal | ICD-10-CM

## 2021-05-14 DIAGNOSIS — I152 Hypertension secondary to endocrine disorders: Principal | ICD-10-CM

## 2021-05-14 DIAGNOSIS — E1159 Type 2 diabetes mellitus with other circulatory complications: Principal | ICD-10-CM

## 2021-05-14 LAB — SODIUM: SODIUM: 139 mmol/L (ref 135–145)

## 2021-05-14 LAB — LIPID PANEL
CHOLESTEROL/HDL RATIO SCREEN: 4.3 (ref 1.0–4.5)
CHOLESTEROL: 201 mg/dL — ABNORMAL HIGH (ref <=200)
HDL CHOLESTEROL: 47 mg/dL (ref 40–60)
LDL CHOLESTEROL CALCULATED: 117 mg/dL — ABNORMAL HIGH (ref 40–99)
NON-HDL CHOLESTEROL: 154 mg/dL — ABNORMAL HIGH (ref 70–130)
TRIGLYCERIDES: 183 mg/dL — ABNORMAL HIGH (ref 0–150)
VLDL CHOLESTEROL CAL: 36.6 mg/dL (ref 11–40)

## 2021-05-14 LAB — POTASSIUM: POTASSIUM: 4.2 mmol/L (ref 3.4–4.8)

## 2021-05-14 LAB — ALBUMIN / CREATININE URINE RATIO
ALBUMIN QUANT URINE: 0.9 mg/dL
ALBUMIN/CREATININE RATIO: 5.3 ug/mg (ref 0.0–30.0)
CREATININE, URINE: 168.5 mg/dL

## 2021-05-14 LAB — CREATININE
CREATININE: 0.66 mg/dL
EGFR CKD-EPI (2021) FEMALE: >90 mL/min/{1.73_m2} (ref >=60)

## 2021-05-14 LAB — TSH: THYROID STIMULATING HORMONE: 2.699 u[IU]/mL (ref 0.550–4.780)

## 2021-05-14 MED ORDER — ATORVASTATIN 80 MG TABLET
ORAL_TABLET | Freq: Every day | ORAL | 2 refills | 90.00000 days | Status: CP
Start: 2021-05-14 — End: 2022-04-17

## 2021-05-14 MED ORDER — BUPROPION HCL XL 300 MG 24 HR TABLET, EXTENDED RELEASE
ORAL_TABLET | Freq: Every morning | ORAL | 2 refills | 90 days | Status: CP
Start: 2021-05-14 — End: 2022-01-18

## 2021-05-14 MED ORDER — LISINOPRIL 10 MG TABLET
ORAL_TABLET | Freq: Every day | ORAL | 2 refills | 90.00000 days | Status: CP
Start: 2021-05-14 — End: 2022-04-17

## 2021-05-14 MED ORDER — CLOBETASOL 0.05 % SCALP SOLUTION
Freq: Two times a day (BID) | TOPICAL | 2 refills | 0 days | Status: CP
Start: 2021-05-14 — End: ?

## 2021-05-14 NOTE — Unmapped (Signed)
[facility] at [facility]     Type of visit: face to face    Reason for visit: Follow up    Questions / Concerns that need to be addressed: no  -     Omron BPs (complete if screening BP has a systolic  > 130 or diastolic > 80)  BP#1 146/96 76   BP#2 142/98 78  BP#3 132/88 76    Average BP  140/94 78  (please note this as a comment in vitals)        Diabetes:  ??? Regularly checking blood sugars?: yes      HCDM reviewed and updated in Epic:    We are working to make sure all of our patients??? wishes are updated in Epic and part of that is documenting a Environmental health practitioner for each patient  A Health Care Decision Maker is someone you choose who can make health care decisions for you if you are not able - who would you most want to do this for you????  is already up to date.    HCDM (patient stated preference): Julia Avila - Mother - [phone]    BPAs completed:  PHQ2  PHQ9  GAD7  Diabetes - POC A1c  Diabetes - urine albumin/creatine ratio  eye/pfizer booster    COVID-19 Vaccine Summary  Which COVID-19 Vaccine was administered  Pfizer  Type:  Moderna  Dates Given:  11/18/2020  Due Date for next dose:   12/16/2020         Date last mAB infusion:  07/07/2020      Immunization History   Administered Date(s) Administered   ??? COVID-19 VAC,MRNA,TRIS(12Y UP)(PFIZER)(GRAY CAP) 06/19/2020   ??? COVID-19 VACC,MRNA,(PFIZER)(PF) 06/21/2019, 07/12/2019, 12/04/2019, 06/19/2020   ??? COVID-19 VACCINE,MRNA(MODERNA)(PF) 11/18/2020   ??? DTaP, Unspecified Formulation 07/16/1967, 08/16/1967, 12/15/1968   ??? Influenza Vaccine Quad (IIV4 PF) 49mo+ injectable 01/12/2015, 01/13/2016, 01/09/2017, 01/24/2020, 04/20/2021   ??? Influenza Virus Vaccine, unspecified formulation 01/30/2018, 02/20/2019   ??? MMR 07/26/1990, 12/29/2000   ??? Measles 05/17/1968, 08/30/1990   ??? Measles / Rubella 01/06/1989   ??? Mumps 05/18/1971   ??? PNEUMOCOCCAL POLYSACCHARIDE 23 09/09/2014   ??? Pneumococcal Conjugate 13-Valent 05/31/2019   ??? Polio Virus Vaccine, Unspecified Formulation 07/16/1967, 09/15/1967, 11/15/1967, 12/15/1968   ??? Rubella 07/15/1968   ??? SHINGRIX-ZOSTER VACCINE (HZV), RECOMBINANT,SUB-UNIT,ADJUVANTED IM 06/19/2020   ??? Td (adult) unspecified formulation 09/15/1983   ??? TdaP 12/13/2019   ??? Tetanus-Diptheria Toxoids-TD(TDVAX),Asdorbed,2LF(IM) 12/29/2000       __________________________________________________________________________________________    SCREENINGS COMPLETED IN FLOWSHEETS    HARK Screening       AUDIT       PHQ2       PHQ9  Thoughts that you would be better off dead, or of hurting yourself in some way: Not at all  PHQ-9 TOTAL SCORE: 3    P4 Suicidality Screener                GAD7    Over the last 2 weeks, how often have you been bothered by the following problems?  Feeling nervous, anxious or on edge: Several days  Not being able to stop or control worrying: Several days  Worrying too much about different things: More than half the days  Trouble relaxing: Several days  Being so restless that it is hard to sit still: Not at all  Becoming easily annoyed or irritable: Several days  Feeling afraid as if something awful might happen: Several days  GAD-7 Total  Score: 7  How difficult have these problems made it for you to do your work, take care of things at home, or get along with other people?: Somewhat difficult    COPD Assessment       Falls Risk

## 2021-05-14 NOTE — Unmapped (Signed)
Internal Medicine Clinic Visit    Reason for visit: Routine follow-up  ??  A/P:  ??  T2DM: A1C is 6% today off all medications!!  -Freestyle libre for BG monitoring  -CTM need to resume medications. Would resume Ozempic first given prior intolerance to Metformin (diarrhea).   ??  HTN, microalbuminuria: BP mildly elevated today on Lisinopril mono-therapy.   -Lisinopril 10 mg daily  -Urine Alb/Cr today  -Patient will perform ambulatory BP monitoring and let me know her readings. Can increase Lisinopril if remains above goal.   -CTM  ??  Psoriatic Arthritis: Joint complaints and skin disease under great control with Cosentyx.  -Cosentyx per Rheumatology  -Continue topical agents as needed.  -Follow up with Rheum and Derm as scheduled.   ??  Depression: Mood under great control with PHQ-9 improved.   - Bupropion XL to 300mg  daily  - Continue follow up with [facility] IM counseling prn  ??  Elevated TSH: TSH has remained normal off Levothyroxine.   -Repeat TSH today  -CTM??        TSH   Date Value Ref Range Status   06/17/2020 4.240 0.550 - 4.780 uIU/mL Final   10/03/2019 4.690 (H) 0.450 - 4.500 uIU/mL Final     HLD: LDL 186 in 2021.  -Check Lipid Panel today to monitor on statin therapy.   -Atorvastatin 80 mg daily    Vitamin D Deficiency: Vit D 13.5 in 2020, no repeat check since then.   -f/u Vit D level today  -Continue Vitamin D 2,000 units daily   ??  HCM:   Urine Microalbumin/Cr today  HTN 3 today  PCV20 and Bivalent COVID booster today.     Julia Avila was seen today for follow-up.    Diagnoses and all orders for this visit:    General medical examination    Type 2 diabetes mellitus with microalbuminuria, with long-term current use of insulin (CMS-HCC)  -     Microalbumin / creatinine urine ratio  -     Eye exam    Hypertension associated with diabetes (CMS-HCC)  -     Sodium  -     Potassium Level  -     Creatinine    Immunosuppressed status (CMS-HCC)    Need for pneumococcal vaccine  -     PNEUMOCOCCAL CONJUGATE VACCINE 20-VALENT    Mixed hyperlipidemia  -     Lipid Panel  -     atorvastatin (LIPITOR) 80 MG tablet; Take 1 tablet (80 mg total) by mouth daily.    Depression, unspecified depression type  -     buPROPion (WELLBUTRIN XL) 300 MG 24 hr tablet; Take 1 tablet (300 mg total) by mouth every morning.    Psoriatic arthritis (CMS-HCC)  -     clobetasoL (TEMOVATE) 0.05 % external solution; Apply topically Two (2) times a day.    Essential hypertension  -     lisinopriL (PRINIVIL,ZESTRIL) 10 MG tablet; Take 1 tablet (10 mg total) by mouth daily.    Vitamin D deficiency  -     Vitamin D 25 Hydroxy (25OH D2 + D3)    Hypothyroidism, unspecified type  -     TSH    COVID-19 vaccine administered  -     COVID-19 VACCINE INJ(PF), MRNA(PFIZER)    Other orders  -     POCT Glycosylated Hemoglobin (HGB A1c)    _________________________________________________________    HPI:  Julia Avila is a 56 yo woman with a history of  T2DM, HTN, PsA and PsA arthritis, depression who presents for routine follow-up.     She has been doing really well since I saw her last. Has made huge lifestyle changes - cut out sugary foods, watching portion sizes, and trying to remain active and exercise. She has not been on any diabetes medications for many months now. Using her CGM and sugars are well controlled - Below 100 range usually. Still taking BP medications but has not checked her pressures at home recently. Will do so again as running a bit high in clinic today.     Has been working with counseling at [facility] and has good response. Wellbutrin seems to be keeping her mood stable. Has needed gabapentin previously for skin picking but does not feel she needs it right now.   _________________________________________________________  Problem List:  Patient Active Problem List   Diagnosis   ??? Anemia   ??? Type 2 diabetes mellitus with microalbuminuria, with long-term current use of insulin (CMS-HCC)   ??? 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 Psoriatic arthritis (CMS-HCC)   ??? Bilateral hearing loss   ??? Hypertension associated with diabetes (CMS-HCC)   ??? Immunosuppressed status (CMS-HCC)   ??? Vitamin D deficiency   ??? Grief       Medications:  Reviewed in EPIC  __________________________________________________________    Physical Exam:   Vital Signs:  Vitals:    05/14/21 1344   BP: 140/94   BP Site: L Arm   BP Position: Sitting   BP Cuff Size: Medium   Pulse: 78   Temp: 36.4 ??C (97.5 ??F)   TempSrc: Temporal   SpO2: 96%   Weight: 74.8 kg (165 lb)   Height: 154.9 cm (5' 1)       Gen: Well appearing, NAD  CV: RRR, no murmurs  Pulm: CTA bilaterally, no crackles or wheezes  Abd: Soft, NTND  Ext: No edema  Skin: No rashes on exposed skin.  Pscyh: Mood and affect appropriate.

## 2021-05-14 NOTE — Unmapped (Addendum)
(1) Lab work today    (2) Pneumonia Vaccine today    (3) Try Voltaren gel for your foot pain.     (4) Check your blood pressure at home and send me the numbers. We may need to increase your Lisinopril if you see numbers 130-150 for the top.    ======================    What is high blood pressure?  High blood pressure, or hypertension, is when your blood pressure is consistently too high. High blood pressure increases your risk of health complications including kidney disease, heart disease, and strokes.     Why should I measure my blood pressure at home?  Monitoring your blood pressure at home is recommended for all people with high blood pressure. This helps your doctor determine whether your treatments are working or if you should start medication for your blood pressure.     What should I use to measure my blood pressure at home?  Look for a blood pressure monitor that uses a cuff placed on your Arm.   Wrist and finger monitors are NOT recommended as they are less reliable.     It is important to choose a home blood pressure monitor that has been validated.   Visit [employer] for a list of blood pressure monitors approved by the American Heart Association    How do I measure my blood pressure at home?  Be Still - Don't smoke, drink caffeinated beverages, or exercise 30 minutes before measuring your blood pressure. Empty your bladder and ensure at least five minutes of rest before measurements. Do not take your blood pressure over clothing.  Sit correctly - Sit with your back straight and supported (dining room chair for example). Feet should be flat on the floor and your legs uncrossed. Support your arm on a flat surface (such as a table) with your arm at the level of your heart. Make sure the bottom of the cuff is placed directly above the bend of the elbow.   Measure at the same time every day, twice a day - It is important to take your blood pressure at the same time of day, twice a day. Choose a time in the morning (ex. 8 AM) and evening (ex 8 PM).  Take multiple readings and record the results - Each time you measure your blood pressure, take three separate readings. Wait one minute between each reading. Record the values you see (top number and bottom number) on the chart below. You can also upload your readings to MyChart to share with your doctor electronically.     What do my blood pressure numbers mean?  Blood pressure goals are different for every patient and best determined by talking to your doctor. Below is a general guide to common blood pressure numbers and what they mean.     My blood pressure in clinic today was blood pressure     My blood pressure goal at home is less than 130/90    BLOOD PRESSURE CATEGORY SYSTOLIC mm Hg (upper number) and/or DIASTOLIC mm Hg (lower number)   NORMAL LESS THAN 120 and LESS THAN 80   ELEVATED 120 - 129 and LESS THAN 80   HIGH BLOOD PRESSURE (HYPERTENSION) STAGE 1 130 - 139 or 80 - 89   HIGH BLOOD PRESSURE (HYPERTENSION) STAGE 2 140 OR HIGHER or 90 OR HIGHER   HYPERTENSIVE CRISIS (consult your doctor immediately) HIGHER THAN 180 and/or HIGHER THAN 120     @PAGEBREAK @    My Blood Pressure Log  DATE AM PM    (1)    (2)    (3) (1)    (2)    (3)    (1)    (2)    (3) (1)    (2)    (3)    (1)    (2)    (3) (1)    (2)    (3)    (1)    (2)    (3) (1)    (2)    (3)    (1)    (2)    (3) (1)    (2)    (3)    (1)    (2)    (3) (1)    (2)    (3)    (1)    (2)    (3) (1)    (2)    (3)    (1)    (2)    (3) (1)    (2)    (3)    (1)    (2)    (3) (1)    (2)    (3)    (1)    (2)    (3) (1)    (2)    (3)

## 2021-05-19 LAB — VITAMIN D 25 HYDROXY: VITAMIN D, TOTAL (25OH): 20.4 ng/mL (ref 20.0–80.0)

## 2021-05-19 NOTE — Unmapped (Signed)
Addended by: Melvyn Novas on: 05/18/2021 05:18 PM     Modules accepted: Orders

## 2021-05-21 NOTE — Unmapped (Signed)
I reviewed with the resident the medical history and the resident’s findings on physical examination.  I discussed with the resident the patient’s diagnosis and concur with the treatment plan as documented in the resident note. Anilah Huck E Kimel-Scott, MD

## 2021-05-27 NOTE — Unmapped (Signed)
[facility] Refill Coordination Note    Specialty Medication(s) to be Shipped:   Inflammatory Disorders: Cosentyx    Other medication(s) to be shipped: No additional medications requested for fill at this time     Julia Avila, DOB: 1965/07/14  Phone: [phone] (home)       All above HIPAA information was verified with patient.     Was a Nurse, learning disability used for this call? No    Completed refill call assessment today to schedule patient's medication shipment from the [facility] [phone]).  All relevant notes have been reviewed.     Specialty medication(s) and dose(s) confirmed: Regimen is correct and unchanged.   Changes to medications: Shonia reports no changes at this time.  Changes to insurance: No  New side effects reported not previously addressed with a pharmacist or physician: None reported  Questions for the pharmacist: No    Confirmed patient received a Conservation officer, historic buildings and a Surveyor, mining with first shipment. The patient will receive a drug information handout for each medication shipped and additional FDA Medication Guides as required.       DISEASE/MEDICATION-SPECIFIC INFORMATION        For patients on injectable medications: Patient currently has 0 doses left.  Next injection is scheduled for 06/07/21.    SPECIALTY MEDICATION ADHERENCE     Medication Adherence    Patient reported X missed doses in the last month: 0  Specialty Medication: COSENTYX  Patient is on additional specialty medications: No              Were doses missed due to medication being on hold? No    Cosentyx 150 mg/ml: 0 days of medicine on hand        REFERRAL TO PHARMACIST     Referral to the pharmacist: Not needed      [facility]     Shipping address confirmed in Epic.     Delivery Scheduled: Yes, Expected medication delivery date: 06/03/21.     Medication will be delivered via UPS to the prescription address in Epic WAM.    Unk Lightning   [facility] Specialty Technician

## 2021-06-02 MED FILL — COSENTYX PEN 300 MG/2 PENS (150 MG/ML) SUBCUTANEOUS: SUBCUTANEOUS | 28 days supply | Qty: 2 | Fill #1

## 2021-07-02 NOTE — Unmapped (Signed)
[facility] Refill Coordination Note    Specialty Medication(s) to be Shipped:   Inflammatory Disorders: Cosentyx    Other medication(s) to be shipped: No additional medications requested for fill at this time     Julia Avila, DOB: October 22, 1965  Phone: [phone] (home)       All above HIPAA information was verified with patient.     Was a Nurse, learning disability used for this call? No    Completed refill call assessment today to schedule patient's medication shipment from the [facility] [phone]).  All relevant notes have been reviewed.     Specialty medication(s) and dose(s) confirmed: Regimen is correct and unchanged.   Changes to medications: [name] reports no changes at this time.  Changes to insurance: No  New side effects reported not previously addressed with a pharmacist or physician: None reported  Questions for the pharmacist: No    Confirmed patient received a Conservation officer, historic buildings and a Surveyor, mining with first shipment. The patient will receive a drug information handout for each medication shipped and additional FDA Medication Guides as required.       DISEASE/MEDICATION-SPECIFIC INFORMATION        For patients on injectable medications: Patient currently has 0 doses left.  Next injection is scheduled for 3/21.    SPECIALTY MEDICATION ADHERENCE     Medication Adherence    Patient reported X missed doses in the last month: 0  Specialty Medication: Cosentyx  Patient is on additional specialty medications: No  Patient is on more than two specialty medications: No  Any gaps in refill history greater than 2 weeks in the last 3 months: no  Demonstrates understanding of importance of adherence: yes  Informant: patient              Were doses missed due to medication being on hold? No    Cosentyx 150mg /ml: Patient has 0 days of medication on hand    REFERRAL TO PHARMACIST     Referral to the pharmacist: Not needed      [facility]     Shipping address confirmed in Epic. Delivery Scheduled: Yes, Expected medication delivery date: 3/21.     Medication will be delivered via UPS to the prescription address in Epic WAM.    Olga Millers   [facility] Specialty Technician

## 2021-07-05 MED FILL — COSENTYX PEN 300 MG/2 PENS (150 MG/ML) SUBCUTANEOUS: SUBCUTANEOUS | 28 days supply | Qty: 2 | Fill #2

## 2021-08-05 NOTE — Unmapped (Signed)
[facility] Refill Coordination Note    Specialty Medication(s) to be Shipped:   Inflammatory Disorders: Cosentyx    Other medication(s) to be shipped: No additional medications requested for fill at this time     Julia Avila, DOB: 14-Jan-1966  Phone: [phone] (home)       All above HIPAA information was verified with patient.     Was a Nurse, learning disability used for this call? No    Completed refill call assessment today to schedule patient's medication shipment from the [facility] [phone]).  All relevant notes have been reviewed.     Specialty medication(s) and dose(s) confirmed: Regimen is correct and unchanged.   Changes to medications: Julia Avila reports no changes at this time.  Changes to insurance: No  New side effects reported not previously addressed with a pharmacist or physician: None reported  Questions for the pharmacist: No    Confirmed patient received a Conservation officer, historic buildings and a Surveyor, mining with first shipment. The patient will receive a drug information handout for each medication shipped and additional FDA Medication Guides as required.       DISEASE/MEDICATION-SPECIFIC INFORMATION        For patients on injectable medications: Patient currently has 0 doses left.  Next injection is scheduled for 4/25.    SPECIALTY MEDICATION ADHERENCE     Medication Adherence    Patient reported X missed doses in the last month: 0  Specialty Medication: Cosentyx  Patient is on additional specialty medications: No  Patient is on more than two specialty medications: No  Any gaps in refill history greater than 2 weeks in the last 3 months: no  Demonstrates understanding of importance of adherence: yes  Informant: patient              Were doses missed due to medication being on hold? No  Cosentyx 150mg /ml: Patient has 0 days of medication on hand    REFERRAL TO PHARMACIST     Referral to the pharmacist: Not needed      [facility]     Shipping address confirmed in Epic. Delivery Scheduled: Yes, Expected medication delivery date: 4/25.     Medication will be delivered via UPS to the prescription address in Epic WAM.    Julia Avila   [facility] Specialty Technician

## 2021-08-09 MED FILL — COSENTYX PEN 300 MG/2 PENS (150 MG/ML) SUBCUTANEOUS: SUBCUTANEOUS | 28 days supply | Qty: 2 | Fill #3

## 2021-09-02 NOTE — Unmapped (Signed)
[facility] Refill Coordination Note    Specialty Medication(s) to be Shipped:   Inflammatory Disorders: Cosentyx    Other medication(s) to be shipped: No additional medications requested for fill at this time     Julia Avila, DOB: 03/15/1966  Phone: [phone] (home)       All above HIPAA information was verified with patient.     Was a Nurse, learning disability used for this call? No    Completed refill call assessment today to schedule patient's medication shipment from the [facility] [phone]).  All relevant notes have been reviewed.     Specialty medication(s) and dose(s) confirmed: Regimen is correct and unchanged.   Changes to medications: Julia Avila reports no changes at this time.  Changes to insurance: No  New side effects reported not previously addressed with a pharmacist or physician: None reported  Questions for the pharmacist: No    Confirmed patient received a Conservation officer, historic buildings and a Surveyor, mining with first shipment. The patient will receive a drug information handout for each medication shipped and additional FDA Medication Guides as required.       DISEASE/MEDICATION-SPECIFIC INFORMATION        For patients on injectable medications: Patient currently has 0 doses left.  Next injection is scheduled for 5/23.    SPECIALTY MEDICATION ADHERENCE     Medication Adherence    Patient reported X missed doses in the last month: 0  Specialty Medication: cosentyx  Patient is on additional specialty medications: No  Patient is on more than two specialty medications: No  Any gaps in refill history greater than 2 weeks in the last 3 months: no  Demonstrates understanding of importance of adherence: yes  Informant: patient              Were doses missed due to medication being on hold? No    Cosentyx 150mg /ml: Patient has 0 days of medication on hand    REFERRAL TO PHARMACIST     Referral to the pharmacist: Not needed      [facility]     Shipping address confirmed in Epic. Delivery Scheduled: Yes, Expected medication delivery date: 5/23.     Medication will be delivered via UPS to the prescription address in Epic WAM.    Olga Millers   [facility] Specialty Technician

## 2021-09-06 MED FILL — COSENTYX PEN 300 MG/2 PENS (150 MG/ML) SUBCUTANEOUS: SUBCUTANEOUS | 28 days supply | Qty: 2 | Fill #4

## 2021-09-28 NOTE — Unmapped (Signed)
[facility] Clinical Assessment & Refill Coordination Note    Julia KIMMEY, DOB: 1965-07-02  Phone: [phone] (home)     All above HIPAA information was verified with patient.     Was a Nurse, learning disability used for this call? No    Specialty Medication(s):   Inflammatory Disorders: Cosentyx     Current Outpatient Medications   Medication Sig Dispense Refill   ??? atorvastatin (LIPITOR) 80 MG tablet Take 1 tablet (80 mg total) by mouth daily. 90 tablet 2   ??? BD ULTRA-FINE MINI PEN NEEDLE 31 gauge x 3/16 (5 mm) Ndle USE 3 TIMES DAILY 100 each 3   ??? blood sugar diagnostic (GLUCOSE BLOOD) Strp Test blood sugars three times daily. ICD-10 E11.9 100 strip 11   ??? blood-gluc transmitter-sensor Misc 1 each by Miscellaneous route every fourteen (14) days. 6 each 3   ??? blood-glucose meter kit Use as instructed 1 each 0   ??? blood-glucose meter Misc Use to check blood sugars three times daily. ICD10 E11.9 1 each 0   ??? buPROPion (WELLBUTRIN XL) 300 MG 24 hr tablet Take 1 tablet (300 mg total) by mouth every morning. 90 tablet 2   ??? clobetasoL (TEMOVATE) 0.05 % external solution Apply topically Two (2) times a day. 50 mL 2   ??? flash glucose sensor (FREESTYLE LIBRE 14 DAY SENSOR) kit by Other route every fourteen (14) days. Please dispense sensors for Freestyle Libre 14 day System. Use to check blood sugars continuously. E11.29 Z79.4. 6 kit 3   ??? FREESTYLE LIBRE 14 DAY READER USE AS DIRECTED 1 each 11   ??? inhalational spacing device Spcr Use with inhaler to maximize medication delivery to lungs 1 each 0   ??? lancets Misc 1 each by Other route once daily. Test daily before breakfast. Dispense 90 day supply. (Patient not taking: Reported on 04/20/2021) 100 each 3   ??? lisinopriL (PRINIVIL,ZESTRIL) 10 MG tablet Take 1 tablet (10 mg total) by mouth daily. 90 tablet 2   ??? secukinumab (COSENTYX PEN, 2 PENS,) 150 mg/mL PnIj injection Inject the contents of 2 pens (300mg  total) under the skin every 28 days 6 mL 3   ??? triamcinolone (KENALOG) 0.1 % ointment Apply topically Two (2) times a day. 30 g 1   ??? VITAMIN D3 50 mcg (2,000 unit) cap TAKE 1 CAPSULE BY MOUTH DAILY 320 capsule 0     No current facility-administered medications for this visit.        Changes to medications: Julia Avila reports no changes at this time.    Allergies   Allergen Reactions   ??? Nitrofurantoin Macrocrystal Rash     Large red welts all over her body   ??? Metformin Diarrhea       Changes to allergies: No    SPECIALTY MEDICATION ADHERENCE       Medication Adherence    Patient reported X missed doses in the last month: 0  Specialty Medication: Cosentyx  Patient is on additional specialty medications: No  Informant: patient          Specialty medication(s) dose(s) confirmed: Regimen is correct and unchanged.     Are there any concerns with adherence? No    Adherence counseling provided? Not needed    CLINICAL MANAGEMENT AND INTERVENTION      Clinical Benefit Assessment:    Do you feel the medicine is effective or helping your condition? Yes    Clinical Benefit counseling provided? Progress note from 04/20/21  shows evidence of clinical benefit    Adverse Effects Assessment:    Are you experiencing any side effects? No    Are you experiencing difficulty administering your medicine? No    Quality of Life Assessment:    Quality of Life    Rheumatology  1. What impact has your specialty medication had on the reduction of your daily pain level?: Tremendous  2. What impact has your specialty medication had on your ability to complete daily tasks (prepare meals, get dressed, etc...)?Marland Kitchen Tremendous  Oncology  Dermatology  Cystic Fibrosis          Have you discussed this with your provider? Not needed    Acute Infection Status:    Acute infections noted within Epic:  No active infections  Patient reported infection: None    Therapy Appropriateness:    Is therapy appropriate and patient progressing towards therapeutic goals? Yes, therapy is appropriate and should be continued    DISEASE/MEDICATION-SPECIFIC INFORMATION      For patients on injectable medications: Patient currently has 0 doses left.  Next injection is scheduled for 6/20.    PATIENT SPECIFIC NEEDS     - Does the patient have any physical, cognitive, or cultural barriers? No    - Is the patient high risk? No    - Does the patient require a Care Management Plan? No     SOCIAL DETERMINANTS OF HEALTH     At the [facility], we have learned that life circumstances - like trouble affording food, housing, utilities, or transportation can affect the health of many of our patients.   That is why we wanted to ask: are you currently experiencing any life circumstances that are negatively impacting your health and/or quality of life? Patient declined to answer    Social Determinants of Health     Financial Resource Strain: Low Risk    ??? Difficulty of Paying Living Expenses: Not very hard   Internet Connectivity: Not on file   Food Insecurity: No Food Insecurity   ??? Worried About Running Out of Food in the Last Year: Never true   ??? Ran Out of Food in the Last Year: Never true   Tobacco Use: Low Risk    ??? Smoking Tobacco Use: Never   ??? Smokeless Tobacco Use: Never   ??? Passive Exposure: Not on file   Housing/Utilities: Low Risk    ??? Within the past 12 months, have you ever stayed: outside, in a car, in a tent, in an overnight shelter, or temporarily in someone else's home (i.e. couch-surfing)?: No   ??? Are you worried about losing your housing?: No   ??? Within the past 12 months, have you been unable to get utilities (heat, electricity) when it was really needed?: No   Alcohol Use: Not At Risk   ??? How often do you have a drink containing alcohol?: Never   ??? How many drinks containing alcohol do you have on a typical day when you are drinking?: 1 - 2   ??? How often do you have 5 or more drinks on one occasion?: Never   Transportation Needs: No Transportation Needs   ??? Lack of Transportation (Medical): No   ??? Lack of Transportation (Non-Medical): No   Substance Use: Low Risk    ??? Taken prescription drugs for non-medical reasons: Never   ??? Taken illegal drugs: Never   ??? Patient indicated they have taken drugs in the past year for non-medical reasons: Yes, [positive  answer(s)]: Not on file   Health Literacy: Not on file   Physical Activity: Not on file   Interpersonal Safety: Not on file   Stress: Not on file   Intimate Partner Violence: Not At Risk   ??? Fear of Current or Ex-Partner: No   ??? Emotionally Abused: No   ??? Physically Abused: No   ??? Sexually Abused: No   Depression: Not at risk   ??? PHQ-2 Score: 2   Social Connections: Not on file       Would you be willing to receive help with any of the needs that you have identified today? Not applicable       SHIPPING     Specialty Medication(s) to be Shipped:   Inflammatory Disorders: Cosentyx    Other medication(s) to be shipped: No additional medications requested for fill at this time     Changes to insurance: No    Delivery Scheduled: Yes, Expected medication delivery date: 6/16.     Medication will be delivered via UPS to the confirmed prescription address in [facility].    The patient will receive a drug information handout for each medication shipped and additional FDA Medication Guides as required.  Verified that patient has previously received a Conservation officer, historic buildings and a Surveyor, mining.    The patient or caregiver noted above participated in the development of this care plan and knows that they can request review of or adjustments to the care plan at any time.      All of the patient's questions and concerns have been addressed.    Julianne Rice   [facility] Specialty Pharmacist

## 2021-09-30 MED FILL — COSENTYX PEN 300 MG/2 PENS (150 MG/ML) SUBCUTANEOUS: SUBCUTANEOUS | 28 days supply | Qty: 2 | Fill #5

## 2021-10-25 NOTE — Unmapped (Signed)
[facility] Pharm[facility] Refill Coordination Note    Specialty Medication(s) to be Shipped:   Inflammatory Disorders: Cosentyx    Other medication(s) to be shipped: No additional medications requested for fill at this time     Julia Avila, DOB: 10/17/1965  Phone: [phone] (home)       All above HIPAA information was verified with patient.     Was a Nurse, learning disability used for this call? No    Completed refill call assessment today to schedule patient's medication shipment from the [facility] [phone]).  All relevant notes have been reviewed.     Specialty medication(s) and dose(s) confirmed: Regimen is correct and unchanged.   Changes to medications: Lily reports no changes at this time.  Changes to insurance: No  New side effects reported not previously addressed with a pharmacist or physician: None reported  Questions for the pharmacist: No    Confirmed patient received a Conservation officer, historic buildings and a Surveyor, mining with first shipment. The patient will receive a drug information handout for each medication shipped and additional FDA Medication Guides as required.       DISEASE/MEDICATION-SPECIFIC INFORMATION        For patients on injectable medications: Patient currently has 0 doses left.  Next injection is scheduled for 7/20.    SPECIALTY MEDICATION ADHERENCE     Medication Adherence    Patient reported X missed doses in the last month: 0  Specialty Medication: Cosentyx  Patient is on additional specialty medications: No  Patient is on more than two specialty medications: No  Any gaps in refill history greater than 2 weeks in the last 3 months: no  Demonstrates understanding of importance of adherence: yes  Informant: patient              Were doses missed due to medication being on hold? No    Cosentyx 150mg /ml: Patient has 0 days of medication on hand    REFERRAL TO PHARMACIST     Referral to the pharmacist: Not needed      [facility]     Shipping address confirmed in Epic. Delivery Scheduled: Yes, Expected medication delivery date: 7/18.     Medication will be delivered via UPS to the prescription address in Epic WAM.    Julia Avila   [facility] Specialty Technician

## 2021-11-01 MED FILL — COSENTYX PEN 300 MG/2 PENS (150 MG/ML) SUBCUTANEOUS: SUBCUTANEOUS | 28 days supply | Qty: 2 | Fill #6

## 2021-11-17 ENCOUNTER — Ambulatory Visit: Admit: 2021-11-17 | Discharge: 2021-11-18 | Payer: PRIVATE HEALTH INSURANCE

## 2021-11-17 DIAGNOSIS — L409 Psoriasis, unspecified: Principal | ICD-10-CM

## 2021-11-17 DIAGNOSIS — L4050 Arthropathic psoriasis, unspecified: Principal | ICD-10-CM

## 2021-11-17 DIAGNOSIS — Z79899 Other long term (current) drug therapy: Principal | ICD-10-CM

## 2021-11-17 LAB — CBC W/ AUTO DIFF
BASOPHILS ABSOLUTE COUNT: 0 10*9/L (ref 0.0–0.1)
BASOPHILS RELATIVE PERCENT: 0.7 %
EOSINOPHILS ABSOLUTE COUNT: 0.1 10*9/L (ref 0.0–0.5)
EOSINOPHILS RELATIVE PERCENT: 1.8 %
HEMATOCRIT: 43 % (ref 34.0–44.0)
HEMOGLOBIN: 14.3 g/dL (ref 11.3–14.9)
LYMPHOCYTES ABSOLUTE COUNT: 2.7 10*9/L (ref 1.1–3.6)
LYMPHOCYTES RELATIVE PERCENT: 36.9 %
MEAN CORPUSCULAR HEMOGLOBIN CONC: 33.2 g/dL (ref 32.0–36.0)
MEAN CORPUSCULAR HEMOGLOBIN: 27.9 pg (ref 25.9–32.4)
MEAN CORPUSCULAR VOLUME: 83.9 fL (ref 77.6–95.7)
MEAN PLATELET VOLUME: 9.6 fL (ref 6.8–10.7)
MONOCYTES ABSOLUTE COUNT: 0.5 10*9/L (ref 0.3–0.8)
MONOCYTES RELATIVE PERCENT: 7.1 %
NEUTROPHILS ABSOLUTE COUNT: 3.9 10*9/L (ref 1.8–7.8)
NEUTROPHILS RELATIVE PERCENT: 53.5 %
PLATELET COUNT: 224 10*9/L (ref 150–450)
RED BLOOD CELL COUNT: 5.12 10*12/L (ref 3.95–5.13)
RED CELL DISTRIBUTION WIDTH: 13.8 % (ref 12.2–15.2)
WBC ADJUSTED: 7.2 10*9/L (ref 3.6–11.2)

## 2021-11-17 LAB — CREATININE
CREATININE: 0.64 mg/dL
EGFR CKD-EPI (2021) FEMALE: >90 mL/min/{1.73_m2} (ref >=60)

## 2021-11-17 LAB — AST: AST (SGOT): 25 U/L (ref <=34)

## 2021-11-17 LAB — ALT: ALT (SGPT): 47 U/L (ref 10–49)

## 2021-11-17 NOTE — Unmapped (Signed)
Patient Name: Julia Avila  PCP: ??Reece Leader, MD  Source of History: patient and records  Date of Visit: 11/17/21 1:09 PM     Chief Compliant: follow-up for Psoriatic Arthritis    PRIOR RHEUMATOLOGIC HISTORY:?? hx of psoriasis (since childhood) and psoriatic arthritis. Has had peripheral and axial involvement.   Treatment hx:   - mtx w/o relief  - Enbrel with good results for 9 years, then loss of efficacy  - Enbrel changed to humira in 2015, gaps in therapy due to recurrent UTIs, found to have nephrolithiasis s/p lithotripsy and ureteral dilation.   H[facility]erson [place] w/o efficacy  - Simponi w/o efficacy  - Cosentyx since 2016.   - Current med regimen: cosentyx 300 mg q 28 days monotherapy     HPI: Julia Avila is a 56 y.o. female who presents for her follow-up for psoriatic arthritis. She was last seen by me in person in March 2020 which is when I first met her. Connected with me virtually in February 2022 as a video visit. She was seen in person in August 2022 and January 2023 by Carlus Pavlov [facility] in person and appeared well controlled on Cosentyx monotherapy. She presents for follow-up in person today.     Today, patient has noted stiffness in hands and feet in the morning in the past month. Stiffness is mild.  She reports compliance with Cosentyx 300 mg every 4 weeks. She has been having low back pain and seeing a chiropractor.  She reports psoriasis has been more active in her scalp as well as around her ears. She is using triamcinolone as well as OTC hydrocortisone ointment.     ROS??:  Attests to the above, otherwise all other systems are negative. ??  ??  Past Medical and Surgical History:  ??  Patient Active Problem List    Diagnosis Date Noted   ??? 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 Grief 12/11/2020   ??? Vitamin D deficiency 10/30/2020   ??? Immunosuppressed status (CMS-HCC) 06/29/2020   ??? Hypertension associated with diabetes (CMS-HCC) 01/13/2016   ??? Bilateral hearing loss 05/01/2015   ??? Psoriatic arthritis (CMS-HCC) 11/05/2014   ??? Type 2 diabetes mellitus with microalbuminuria, with long-term current use of insulin (CMS-HCC) 09/09/2014   ??? Anemia 08/08/2014     Past Surgical History:   Procedure Laterality Date   ??? CESAREAN SECTION     ??? LIPOSUCTION     ??? PR COLONOSCOPY FLX DX W/COLLJ SPEC WHEN PFRMD N/A 08/11/2014    Procedure: COLONOSCOPY, FLEXIBLE, PROXIMAL TO SPLENIC FLEXURE; DIAGNOSTIC, W/WO COLLECTION SPECIMEN BY BRUSH OR WASH;  Surgeon: Tish Men, MD;  [facility];  Service: Gastroenterology   ??? PR COLONOSCOPY W/BIOPSY SINGLE/MULTIPLE N/A 05/11/2020    Procedure: COLONOSCOPY, FLEXIBLE, PROXIMAL TO SPLENIC FLEXURE; WITH BIOPSY, SINGLE OR MULTIPLE;  Surgeon: Jarvis Morgan, MD;  Location: [facility];  Service: Gastroenterology   ??? PR UPPER GI ENDOSCOPY,BIOPSY N/A 05/11/2020    Procedure: UGI ENDOSCOPY; WITH BIOPSY, SINGLE OR MULTIPLE;  Surgeon: Jarvis Morgan, MD;  Location: [facility];  Service: Gastroenterology   ??? TYMPANOSTOMY TUBE PLACEMENT     ??? WISDOM TOOTH EXTRACTION      10 years ago     Allergies:   ??  Allergies   Allergen Reactions   ??? Nitrofurantoin Macrocrystal Rash     Large red welts all over her body   ???  Metformin Diarrhea     Current Outpatient Medications:  ??  Current Outpatient Medications on File Prior to Visit   Medication Sig Dispense Refill   ??? clobetasoL (TEMOVATE) 0.05 % external solution Apply topically Two (2) times a day. 50 mL 2   ??? secukinumab (COSENTYX PEN, 2 PENS,) 150 mg/mL PnIj injection Inject the contents of 2 pens (300mg  total) under the skin every 28 days 6 mL 3   ??? triamcinolone (KENALOG) 0.1 % ointment Apply topically Two (2) times a day. 30 g 1   ??? atorvastatin (LIPITOR) 80 MG tablet Take 1 tablet (80 mg total) by mouth daily. (Patient not taking: Reported on 11/17/2021) 90 tablet 2   ??? BD ULTRA-FINE MINI PEN NEEDLE 31 gauge x 3/16 (5 mm) Ndle USE 3 TIMES DAILY (Patient not taking: Reported on 11/17/2021) 100 each 3   ??? blood sugar diagnostic (GLUCOSE BLOOD) Strp Test blood sugars three times daily. ICD-10 E11.9 (Patient not taking: Reported on 11/17/2021) 100 strip 11   ??? blood-gluc transmitter-sensor Misc 1 each by Miscellaneous route every fourteen (14) days. (Patient not taking: Reported on 11/17/2021) 6 each 3   ??? blood-glucose meter kit Use as instructed (Patient not taking: Reported on 11/17/2021) 1 each 0   ??? blood-glucose meter Misc Use to check blood sugars three times daily. ICD10 E11.9 (Patient not taking: Reported on 11/17/2021) 1 each 0   ??? buPROPion (WELLBUTRIN XL) 300 MG 24 hr tablet Take 1 tablet (300 mg total) by mouth every morning. (Patient not taking: Reported on 11/17/2021) 90 tablet 2   ??? flash glucose sensor (FREESTYLE LIBRE 14 DAY SENSOR) kit by Other route every fourteen (14) days. Please dispense sensors for Freestyle Libre 14 day System. Use to check blood sugars continuously. E11.29 Z79.4. (Patient not taking: Reported on 11/17/2021) 6 kit 3   ??? FREESTYLE LIBRE 14 DAY READER USE AS DIRECTED (Patient not taking: Reported on 11/17/2021) 1 each 11   ??? inhalational spacing device Spcr Use with inhaler to maximize medication delivery to lungs (Patient not taking: Reported on 11/17/2021) 1 each 0   ??? lancets Misc 1 each by Other route once daily. Test daily before breakfast. Dispense 90 day supply. (Patient not taking: Reported on 04/20/2021) 100 each 3   ??? lisinopriL (PRINIVIL,ZESTRIL) 10 MG tablet Take 1 tablet (10 mg total) by mouth daily. (Patient not taking: Reported on 11/17/2021) 90 tablet 2   ??? VITAMIN D3 50 mcg (2,000 unit) cap TAKE 1 CAPSULE BY MOUTH DAILY (Patient not taking: Reported on 11/17/2021) 320 capsule 0     No current facility-administered medications on file prior to visit.     ??  Immunization History   Administered Date(s) Administered   ??? COVID-19 VAC,MRNA,TRIS(12Y UP)(PFIZER)(GRAY CAP) 06/19/2020   ??? COVID-19 VACC,MRNA,(PFIZER)(PF) 06/21/2019, 07/12/2019, 12/04/2019, 06/19/2020, 05/14/2021   ??? COVID-19 VACCINE,MRNA(MODERNA)(PF) 11/18/2020   ??? DTaP, Unspecified Formulation 07/16/1967, 08/16/1967, 12/15/1968   ??? Influenza Vaccine Quad (IIV4 PF) 21mo+ injectable 01/12/2015, 01/13/2016, 01/09/2017, 01/24/2020, 04/20/2021   ??? Influenza Virus Vaccine, unspecified formulation 01/30/2018, 02/20/2019   ??? MMR 07/26/1990, 12/29/2000   ??? Measles 05/17/1968, 08/30/1990   ??? Measles / Rubella 01/06/1989   ??? Mumps 05/18/1971   ??? PNEUMOCOCCAL POLYSACCHARIDE 23 09/09/2014   ??? PPD Test 11/30/2020   ??? Pneumococcal Conjugate 13-Valent 05/31/2019   ??? Pneumococcal Conjugate 20-valent 05/14/2021   ??? Polio Virus Vaccine, Unspecified Formulation 07/16/1967, 09/15/1967, 11/15/1967, 12/15/1968   ??? Rubella 07/15/1968   ??? SHINGRIX-ZOSTER VACCINE (HZV), RECOMBINANT,SUB-UNIT,ADJUVANTED IM 06/19/2020,  12/02/2020   ??? TD(TDVAX),ADSORBED,2LF(IM)(PF) 12/29/2000   ??? Td (adult) unspecified formulation 09/15/1983   ??? TdaP 12/13/2019     ??  PHYSICAL EXAM? -   Vital signs: BP 130/89  - Pulse 78  - Temp 36.2 ??C (97.1 ??F) (Temporal)  - Wt 77.6 kg (171 lb)  - LMP  (LMP Unknown)  - BMI 32.31 kg/m?? Body mass index is 32.31 kg/m??.  Gen: Well-developed, well-nourished adult in no apparent distress. Normocephalic with no external signs of trauma. Pleasant and cooperative. AOx4.?  HEENT: PERRL, EOMI, oropharynx clear  Lungs: CTA bilaterally  CV: nl S1, S2, no r/m/g  Comprehensive Musculoskeletal Examination:??  ?? Jaw, neck without limited ROM.??  ?? Shoulders, elbows, wrists, hands, fingers:  No tenderness, deformity, swelling or limited ROM.??  ?? Hips with full ROM  ?? Knees, ankles, feet, toes:No tenderness, deformity, swelling or limited ROM.  Skin: Psoriasis in scalp noted. Has tiny droplet like lesions on elbows and shins. No significant nail changes    LABORATORY - medication lab monitoring needed today and results reviewed with no signs for toxicity  Recent Results (from the past 168 hour(s))   Creatinine    Collection Time: 11/17/21  1:37 PM   Result Value Ref Range    Creatinine 0.64 0.60 - 0.80 mg/dL    eGFR CKD-EPI (1610) Female >90 >=60 mL/min/1.69m2   ALT    Collection Time: 11/17/21  1:37 PM   Result Value Ref Range    ALT 47 10 - 49 U/L   AST    Collection Time: 11/17/21  1:37 PM   Result Value Ref Range    AST 25 <=34 U/L   CBC w/ Differential    Collection Time: 11/17/21  1:37 PM   Result Value Ref Range    WBC 7.2 3.6 - 11.2 10*9/L    RBC 5.12 3.95 - 5.13 10*12/L    HGB 14.3 11.3 - 14.9 g/dL    HCT 96.0 45.4 - 09.8 %    MCV 83.9 77.6 - 95.7 fL    MCH 27.9 25.9 - 32.4 pg    MCHC 33.2 32.0 - 36.0 g/dL    RDW 11.9 14.7 - 82.9 %    MPV 9.6 6.8 - 10.7 fL    Platelet 224 150 - 450 10*9/L    Neutrophils % 53.5 %    Lymphocytes % 36.9 %    Monocytes % 7.1 %    Eosinophils % 1.8 %    Basophils % 0.7 %    Absolute Neutrophils 3.9 1.8 - 7.8 10*9/L    Absolute Lymphocytes 2.7 1.1 - 3.6 10*9/L    Absolute Monocytes 0.5 0.3 - 0.8 10*9/L    Absolute Eosinophils 0.1 0.0 - 0.5 10*9/L    Absolute Basophils 0.0 0.0 - 0.1 10*9/L       ??  ??GENERAL SUMMARY AND IMPRESSION: ??  ??  In summary, the patient is a 56 y.o. female with psoriatic arthritis on Cosentyx 300 mg once a month monotherapy. Psoriatic arthritis appears well controlled but she does have more active psoriasis, most notably on her scalp. Referral to dermatology. She will try topicals for psoriasis on extremities and tar shampoo for now. Medication monitoring labs completed with no signs for toxicity. Continue Cosentyx. Up to date with immunizations.  Follow-up in 5 months with Carlus Pavlov [facility] and 10 months with myself. Return in about 10 months (around 09/18/2022).    RECOMMENDATIONS: ??  ????   Diagnosis ICD-10-CM Associated Orders  1. Psoriatic arthritis (CMS-HCC)  L40.50 CBC w/ Differential     Creatinine     ALT     AST      2. Psoriasis of scalp  L40.9 Ambulatory referral to Dermatology      3. High risk medication use  Z79.899 CBC w/ Differential     Creatinine     ALT     AST          Patient Instructions   Psoriasis is active. Referral to [facility].    Psoriatic arthritis appears well controlled.     Continue Cosenytx 300 mg every 4 weeks.     Optimize your shampoos and topicals for the psoriasis.     The patient indicates understanding of these issues and agrees to the plan as outlined above.  Contact information provided for any concerns or questions in the interim.  ??  I personally spent 25 minutes face-to-face and non-face-to-face in the care of this patient, which includes all pre, intra, and post visit time on the date of service.  All documented time was specific to the E/M visit and does not include any procedures that may have been performed.      Jeramiah Mccaughey C. Scarlette Calico, MD, PhD  Assistant Professor of Medicine  Department of Medicine/Division of Rheumatology  [facility]  5:57 PM

## 2021-11-17 NOTE — Unmapped (Signed)
Psoriasis is active. Referral to [facility].    Psoriatic arthritis appears well controlled.     Continue Cosenytx 300 mg every 4 weeks.     Optimize your shampoos and topicals for the psoriasis.

## 2021-11-29 NOTE — Unmapped (Signed)
[facility] Refill Coordination Note    Julia Avila, DOB: December 16, 1965  Phone: [phone] (home)       All above HIPAA information was verified with patient.         11/27/2021     6:23 AM   Specialty Rx Medication Refill Questionnaire   Which Medications would you like refilled and shipped? Cosyentx   Please list all current allergies: Metformin   Have you missed any doses in the last 30 days? No   Have you had any changes to your medication(s) since your last refill? No   How many days remaining of each medication do you have at home? 0   If receiving an injectable medication, next injection date is 12/10/2021   Have you experienced any side effects in the last 30 days? No   Please enter the full address (street address, city, state, zip code) where you would like your medication(s) to be delivered to. [address] [place] [address]   Please specify on which day you would like your medication(s) to arrive. Note: if you need your medication(s) within 3 days, please call the pharmacy to schedule your order at [phone]  12/10/2021   Has your insurance changed since your last refill? No   Would you like a pharmacist to call you to discuss your medication(s)? No   Do you require a signature for your package? (Note: if we are billing Medicare Part B or your order contains a controlled substance, we will require a signature) No         Completed refill call assessment today to schedule patient's medication shipment from the [facility] [phone]).  All relevant notes have been reviewed.       Confirmed patient received a Conservation officer, historic buildings and a Surveyor, mining with first shipment. The patient will receive a drug information handout for each medication shipped and additional FDA Medication Guides as required.         REFERRAL TO PHARMACIST     Referral to the pharmacist: Not needed      [facility]     Shipping address confirmed in Epic.     Delivery Scheduled: Yes, Expected medication delivery date: 8/25.     Medication will be delivered via UPS to the prescription address in Epic WAM.    Julia Avila   [facility] Specialty Technician

## 2021-12-08 NOTE — Unmapped (Incomplete)
Basic Skin Care  Your skin plays an important role in keeping the entire body healthy.  Below are some tips on how to try and maximize skin health from the outside in.    Bathe in mildly warm water every 1 to 2 days, followed by light drying and an application of a thick moisturizer cream or ointment, preferably one that comes in a tub.  Fragrance free moisturizing bars or body washes are preferred such as Purpose, Cetaphil, Dove sensitive skin, Aveeno, or Vanicream products.  Use a fragrance free cream or ointment, not a lotion, such as plain petroleum jelly or Vaseline ointment, Aquaphor, Vanicream, Eucerin cream or a generic version, CeraVe Cream, Cetaphil Restoraderm, Aveeno Eczema Therapy and [employer], among others.  People with very dry skin often need to put on these creams two, three or four times a day.  As much as possible, use these creams enough to keep the skin from looking dry.  Consider using fragrance free/dye free detergent, such as Arm and Hammer for sensitive skin, Tide Free or All Free.     If I am prescribing a medication to go on the skin, the medicine goes on first to the areas that need it, followed by a thick cream as above to the entire body.    Wynelle Link is a major cause of damage to the skin.  I recommend sun protection for all of my patients. I prefer physical barriers such as hats with wide brims that cover the ears, long sleeve clothing with SPF protection including rash guards for swimming. These can be found seasonally at outdoor clothing companies, [employer] and [employer] and online at Liz Claiborne.com, www.uvskinz.com and BrideEmporium.nl. Avoid peak sun between the hours of 10am to 3pm to minimize sun exposure.    I recommend sunscreen for all of my patients older than 38 months of age when in the sun, preferably with broad spectrum coverage and SPF 30 or higher.   For sensitive skin: I recommend sunscreens that only contain titanium dioxide and/or zinc oxide in the active ingredients. These do not burn the eyes and appear to be safer than chemical sunscreens. Products includ Vanicream Broad Spectrum 50+, Aveeno Natural Mineral Protection, Neutrogena Pure and Free Baby, [employer] Daily face and body lotion, and EltaMD.  There is no such thing as waterproof sunscreen. All sunscreens should be reapplied after 60-80 minutes of wear.   Iii. For those that prefer not to use cream-based sunscreen, there are alternatives such as SPRAYS and STICKS.  These are also good options if you are sweaty or working out.  Spray on sunscreens often use chemical sunscreens which do protect against the sun. However, these can be difficult to apply correctly, especially if wind is present, and can be more likely to irritate the skin.  I recommend applying it at least twice over the involved areas.  For the stick, I recommend Neutrogena.  It looks like a deodorant stick.  It must be applied 4 times over the same area to protect as much as the cream.      I also recommend discussing Vitamin D supplementation with your primary care doctor as patients typically do not get enough Vitamin D through the skin in our area, even without using sunscreen.

## 2021-12-08 NOTE — Unmapped (Unsigned)
Dermatology Note     Assessment and Plan:      Benign Lesions/ Findings:   {derm skin check benign A&P:75424}  - Reassurance provided regarding the benign appearance of lesions noted on exam today; no treatment is indicated in the absence of symptoms/changes.  - Reinforced importance of photoprotective strategies including liberal and frequent sunscreen use of a broad-spectrum SPF 30 or greater, use of protective clothing, and sun avoidance for prevention of cutaneous malignancy and photoaging.  Counseled patient on the importance of regular self-skin monitoring as well as routine clinical skin examinations as scheduled.     Personal history of {derm skin check cancer list:75440} ***  - No evidence of recurrence at this time.  - Discussed maintaining vigilance and counseled on sun protection as above.    Pruritus in the context of elevated HbA1C:  - Discussed that it can be multifactorial but is particularly common in the context of diabetes.  - Start gabapentin (NEURONTIN) 300 MG capsule; Take 1 tablet by mouth daily for 1 week, then increase to 1 tablet twice daily.  Dispense: 60 capsule; Refill: 3  - Discussed medication risks and benefits especially fatigue when she first starts the medication.  - Plan to titrate up as needed in the future     Prurigo nodularis:  - Discussed condition in relationship to chronic rubbing.  There is no obvious evidence of a primary lesion (no obvious psoriasis or actinic keratosis) although I discussed that sometimes these primary lesions can turn into a prurigo nodule secondary to chronic rubbing.    - Encouraged trying to minimize rubbing is much as possible  - Start mupirocin (BACTROBAN) 2 % ointment; Apply topically to open areas on arms twice daily as needed.  Dispense: 30 g; Refill: 4     History of psoriasis and psoriatic arthritis:  - Currently on Cosentyx 300 mg q28 days    Type 2 diabetes mellitus with microalbuminuria, with long-term current use of insulin (CMS-HCC)  ***    Psoriatic arthritis (CMS-HCC)  ***    Immunosuppressed status (CMS-HCC)  ***      The patient was advised to call for an appointment should any new, changing, or symptomatic lesions develop.     RTC: No follow-ups on file. or sooner as needed   _________________________________________________________________      Chief Complaint     No chief complaint on file.      HPI     Julia Avila. Loureiro is a 56 y.o. female who presents as a returning patient (last seen by Dr. Linton Rump on 10/01/2019) to Dermatology for {reason for visit:75513} At last visit, patient was to start gabapentin 300 mg BID for her pruritus, was to start mupirocin 2% ointment for her prurigo nodularis, and was to continue Cosentyx 300 mg q28 days for her psoriasis and psoriatic arthritis.    ***    The patient denies any other new or changing lesions or areas of concern.     Pertinent Past Medical History     No history of skin cancer    Problem List          Endocrine    Type 2 diabetes mellitus with microalbuminuria, with long-term current use of insulin (CMS-HCC) - Primary       Musculoskeletal and Integument    Psoriatic arthritis (CMS-HCC)       Other    Immunosuppressed status (CMS-HCC)       Family History:   Negative for melanoma  Past Medical History, Family History, Social History, Medication List, Allergies, and Problem List were reviewed in the rooming section of Epic.     ROS: Other than symptoms mentioned in the HPI, no fevers, chills, or other skin complaints    Physical Examination     GENERAL: Well-appearing female in no acute distress, resting comfortably.  NEURO: Alert and oriented, answers questions appropriately  PSYCH: Normal mood and affect  {PE extent:75514}  {PE list:75421}  ***    All areas not commented on are within normal limits or unremarkable    Scribe's Attestation: Cruzita Lederer, MD obtained and performed the history, physical exam and medical decision making elements that were entered into the chart. Signed by Donnella Sham, Scribe, on ***.    {*** NOTE TO PROVIDER: PLEASE ADD ATTESTATION NOTING YOU AGREE WITH SCRIBE DOCUMENTATION}    (Approved Template 12/30/2019)

## 2021-12-09 MED FILL — COSENTYX PEN 300 MG/2 PENS (150 MG/ML) SUBCUTANEOUS: SUBCUTANEOUS | 28 days supply | Qty: 2 | Fill #7

## 2021-12-31 NOTE — Unmapped (Signed)
[facility] Refill Coordination Note    Specialty Medication(s) to be Shipped:   Inflammatory Disorders: Cosentyx    Other medication(s) to be shipped: No additional medications requested for fill at this time     Julia Avila, DOB: [birth year]  Phone: [phone] (home)       All above HIPAA information was verified with patient.     Was a Nurse, learning disability used for this call? No    Completed refill call assessment today to schedule patient's medication shipment from the [facility] [phone]).  All relevant notes have been reviewed.     Specialty medication(s) and dose(s) confirmed: Regimen is correct and unchanged.   Changes to medications: June reports no changes at this time.  Changes to insurance: No  New side effects reported not previously addressed with a pharmacist or physician: None reported  Questions for the pharmacist: No    Confirmed patient received a Conservation officer, historic buildings and a Surveyor, mining with first shipment. The patient will receive a drug information handout for each medication shipped and additional FDA Medication Guides as required.       DISEASE/MEDICATION-SPECIFIC INFORMATION        For patients on injectable medications: Patient currently has 0 doses left.  Next injection is scheduled for 01/10/22 .    SPECIALTY MEDICATION ADHERENCE     Medication Adherence    Patient reported X missed doses in the last month: 0  Specialty Medication: Cosentyx Pen  Patient is on additional specialty medications: No  Patient is on more than two specialty medications: No  Any gaps in refill history greater than 2 weeks in the last 3 months: no  Demonstrates understanding of importance of adherence: yes                          Were doses missed due to medication being on hold? No    Cosentyx 150mg /ml: Patient has 0 days of medication on hand    REFERRAL TO PHARMACIST     Referral to the pharmacist: Not needed      [facility]     Shipping address confirmed in Epic. Delivery Scheduled: Yes, Expected medication delivery date: 01/06/22 .     Medication will be delivered via UPS to the prescription address in Epic WAM.    Ricci Barker   [facility] Specialty Technician

## 2022-01-05 MED FILL — COSENTYX PEN 300 MG/2 PENS (150 MG/ML) SUBCUTANEOUS: SUBCUTANEOUS | 28 days supply | Qty: 2 | Fill #8

## 2022-01-28 NOTE — Unmapped (Signed)
[facility] Refill Coordination Note    Specialty Medication(s) to be Shipped:   Inflammatory Disorders: Cosentyx    Other medication(s) to be shipped: No additional medications requested for fill at this time     Julia Avila, DOB: 1965/12/21  Phone: [phone] (home)       All above HIPAA information was verified with patient.     Was a Nurse, learning disability used for this call? No    Completed refill call assessment today to schedule patient's medication shipment from the [facility] [phone]).  All relevant notes have been reviewed.     Specialty medication(s) and dose(s) confirmed: Regimen is correct and unchanged.   Changes to medications: [name] reports no changes at this time.  Changes to insurance: No  New side effects reported not previously addressed with a pharmacist or physician: None reported  Questions for the pharmacist: No    Confirmed patient received a Conservation officer, historic buildings and a Surveyor, mining with first shipment. The patient will receive a drug information handout for each medication shipped and additional FDA Medication Guides as required.       DISEASE/MEDICATION-SPECIFIC INFORMATION        For patients on injectable medications: Patient currently has 0 doses left.  Next injection is scheduled for 10/25.    SPECIALTY MEDICATION ADHERENCE     Medication Adherence    Patient reported X missed doses in the last month: 0  Specialty Medication: Cosentyx  Patient is on additional specialty medications: No  Patient is on more than two specialty medications: No  Any gaps in refill history greater than 2 weeks in the last 3 months: no  Demonstrates understanding of importance of adherence: yes  Informant: patient                          Were doses missed due to medication being on hold? No    Cosentyx 150mg /ml: Patient has 0 days of medication on hand    REFERRAL TO PHARMACIST     Referral to the pharmacist: Not needed      [facility]     Shipping address confirmed in Epic.     Delivery Scheduled: Yes, Expected medication delivery date: 10/20.     Medication will be delivered via UPS to the prescription address in Epic WAM.    Olga Millers   [facility] Specialty Technician

## 2022-02-03 MED FILL — COSENTYX PEN 300 MG/2 PENS (150 MG/ML) SUBCUTANEOUS: SUBCUTANEOUS | 28 days supply | Qty: 2 | Fill #9

## 2022-03-04 NOTE — Unmapped (Signed)
[facility] Refill Coordination Note    Specialty Medication(s) to be Shipped:   Inflammatory Disorders: Cosentyx    Other medication(s) to be shipped: No additional medications requested for fill at this time     ERNESTO THWAITES, DOB: 08-01-1965  Phone: [phone] (home)       All above HIPAA information was verified with patient.     Was a Nurse, learning disability used for this call? No    Completed refill call assessment today to schedule patient's medication shipment from the [facility] [phone]).  All relevant notes have been reviewed.     Specialty medication(s) and dose(s) confirmed: Regimen is correct and unchanged.   Changes to medications: Gerene reports no changes at this time.  Changes to insurance: No  New side effects reported not previously addressed with a pharmacist or physician: None reported  Questions for the pharmacist: No    Confirmed patient received a Conservation officer, historic buildings and a Surveyor, mining with first shipment. The patient will receive a drug information handout for each medication shipped and additional FDA Medication Guides as required.       DISEASE/MEDICATION-SPECIFIC INFORMATION        For patients on injectable medications: Patient currently has 0 doses left.  Next injection is scheduled for 11/25.    SPECIALTY MEDICATION ADHERENCE     Medication Adherence    Patient reported X missed doses in the last month: 0  Specialty Medication: cosentyx  Patient is on additional specialty medications: No  Patient is on more than two specialty medications: No  Any gaps in refill history greater than 2 weeks in the last 3 months: no  Demonstrates understanding of importance of adherence: yes  Informant: patient                          Were doses missed due to medication being on hold? No    Cosentyx 150mg /ml: Patient has 0 days of medication on hand    REFERRAL TO PHARMACIST     Referral to the pharmacist: Not needed      [facility]     Shipping address confirmed in Epic.     Delivery Scheduled: Yes, Expected medication delivery date: 11/21.     Medication will be delivered via UPS to the prescription address in Epic WAM.    Olga Millers   [facility] Specialty Technician

## 2022-03-07 MED FILL — COSENTYX PEN 300 MG/2 PENS (150 MG/ML) SUBCUTANEOUS: SUBCUTANEOUS | 28 days supply | Qty: 2 | Fill #10

## 2022-03-18 ENCOUNTER — Ambulatory Visit: Admit: 2022-03-18 | Discharge: 2022-03-19 | Payer: PRIVATE HEALTH INSURANCE

## 2022-03-18 DIAGNOSIS — K1379 Other lesions of oral mucosa: Principal | ICD-10-CM

## 2022-03-18 DIAGNOSIS — Z8639 Personal history of other endocrine, nutritional and metabolic disease: Principal | ICD-10-CM

## 2022-03-18 LAB — HEMOGLOBIN A1C
ESTIMATED AVERAGE GLUCOSE: 171 mg/dL
HEMOGLOBIN A1C: 7.6 % — ABNORMAL HIGH (ref 4.8–5.6)

## 2022-03-18 MED ORDER — VALACYCLOVIR 1 GRAM TABLET
ORAL_TABLET | Freq: Two times a day (BID) | ORAL | 0 refills | 4 days | Status: CP
Start: 2022-03-18 — End: 2022-03-25

## 2022-03-18 NOTE — Unmapped (Signed)
Internal Medicine Clinic Visit    Reason for visit: sore mouth    A/P:         1. Acute oral pain    2. Aphthous ulcer of mouth    3. History of diabetes mellitus        1. Acute oral pain - Aphthous ulcers of the mouth: Patient reported 3 day history of mouth pain with multiple sores. Her pain is localized to her mouth, and she denied dysphagia and odynophagia. Ddx include coxsacki virus, Behcet's, B12 deficiency, medication reaction, and autoimmune conditions (e.g., lupus). Her history is significant for oral herpes successfully treated with valtrex. Will prescribe valacyclovir and obtain HSV swab, and perform additional work up pending results. Will also encourage patient to continue OTC benzocaine throat spray and warm salt rinses for relief.  -     valACYclovir (VALTREX) 1000 MG tablet; Take 2 tablets (2,000 mg total) by mouth two (2) times a day for 7 days.  -     continue OTC Mucinex benzocaine throat spray and warm salt rinses  -     HSV PCR    2. History of diabetes mellitus: Last 05/14/21 A1C is 6% off all medications. Per last PCP note in 04/2021, recommend resuming Ozempic given prior intolerance to Metformin (diarrhea) if antihyperglycemic is indicated. Will obtain A1C today.  -     Hemoglobin A1c     3. HTN, microalbuminuria: History of HTN and microalbuminuria (1/27 Alb/Cr 5.3) previously treated with lisinopril 10 mg daily with good effect. BP elevated today (140s-150s/70s-80s), possibly due to patient's discomfort. She does not typically measure BP at home and is no longer on antihypertensives. Will defer resuming lisinopril until oral issues resolve. If patient's BP remain elevated at follow up, consider repeat urine Alb/Cr and restarting ACEi.  -CTM      Not addressed during this visit:    Psoriatic Arthritis: Joint complaints and skin disease under great control with Cosentyx.  -Cosentyx per Rheumatology  -CTM      Depression: Mood under great control with PHQ-9 improved. Not currently on medications.  - Continue follow up with [facility] IM counseling prn  -CTM      Elevated TSH: TSH has remained normal off Levothyroxine. Last TSH 2.7 in 04/2021.  -CTM     No follow-ups on file.    Staffed with Dr. Era Skeen, seen and discussed    __________________________________________________________    HPI:    Julia Avila is a 56 yo patient with psoriatic arthritis, hx T2DM, hx hypothyroidism,     Patient reported doing well recently on mostly carnivore diet but did consume alcohol and deserts during Thanksgiving. She then began to experience 8/10 throbbing mouth pain 3 days ago that is briefly relieved with OTC mucinex throat spray and warm salt rinse. She now noticed a sublingual sore. She does have a history of oral herpes previously treated with valacyclovir, last in 2021, but she said that this is a different presentation than her prior flares, which were characterized by presented as 1-2 sores on her lip or in her mouth. She said her new diet has reduced the frequency of her flares. She denied fevers, chills, headaches, myalgia, throat pain, dysphagia, dysgeusia, dyspnea, cough.    Patient also expressed concern regarding her A1C and would like to have this measured today. She has been managing her diabetes with dietary changes.    Blood pressures were notably elevated during this visit, ranging from 140-159/79-88. She does not monitor BP  at home.  __________________________________________________________        Medications:  Reviewed in EPIC  __________________________________________________________    Physical Exam:   Vital Signs:  Vitals:    03/18/22 1059   BP: 183/98   BP Site: L Arm   BP Position: Sitting   BP Cuff Size: Large   Pulse: 82   Resp: 18   Temp: 37.1 ??C (98.7 ??F)   TempSrc: Oral   SpO2: 96%   Weight: 79.6 kg (175 lb 8 oz)   Height: 154.9 cm (5' 0.98)          PTHomeBP    The patient???s Average Home Blood Pressure during the last two weeks is :   /   based on  readings      Gen: Well appearing, in moderate discomfort  HEENT: aphthous ulcers with white bases and erythematous borders on the  buccal membranes, under tongue, and roof of mouth. Erythematous tongue.  CV: RRR, no murmurs  Pulm: CTA bilaterally, no crackles or wheezes  Abd: Soft, NTND, normal BS.   Ext: No edema      PHQ-9 Score:     GAD-7 Score:       Medication adherence and barriers to the treatment plan have been addressed. Opportunities to optimize healthy behaviors have been discussed. Patient / caregiver voiced understanding.

## 2022-03-18 NOTE — Unmapped (Addendum)
It was a pleasure meeting you today!    Your mouth pain may be caused by a herpes flare. We have swabbed the sores in your mouth and the results of this test will be communicated to you via MyChart. A prescription for valacyclovir was sent to your pharmacy, please take this medication as instructed below:  -Take 1 tablet of valacyclovir (1,000 mg) two times a day for 7 days    Please continue to use the Mucinex throat spray and warm salt rinses for pain.    If you continue to experience mouth pain after completion of your valacyclovir course, please contact our office.

## 2022-03-18 NOTE — Unmapped (Signed)
Screening BP -    BP # 1.159/88 pulse 82    BP.# 2. 155/79 pulse 77    BP # 3. 140/85 pulse 80     Average  Blood pressure: 151/84 pulse 80

## 2022-04-06 MED ORDER — VALACYCLOVIR 1 GRAM TABLET
ORAL_TABLET | Freq: Two times a day (BID) | ORAL | 0 refills | 4 days | Status: CP
Start: 2022-04-06 — End: 2022-04-13

## 2022-04-06 MED ORDER — DIPHENHYDRAMINE/HYDROCORTISONE/LIDOCAINE/NYSTATIN (ADULT MAGIC MOUTHWASH) ORAL MIXTURE
Freq: Four times a day (QID) | OROMUCOSAL | 0 refills | 0.00000 days | Status: CP | PRN
Start: 2022-04-06 — End: 2022-04-06

## 2022-04-06 NOTE — Unmapped (Signed)
[facility] Refill Coordination Note    Julia Avila, DOB: 1966-03-12  Phone: [phone] (home)       All above HIPAA information was verified with patient.         04/05/2022     4:23 PM   Specialty Rx Medication Refill Questionnaire   Which Medications would you like refilled and shipped? Cosentyx   Please list all current allergies: On file   Have you missed any doses in the last 30 days? No   Have you had any changes to your medication(s) since your last refill? No   How many days remaining of each medication do you have at home? None   If receiving an injectable medication, next injection date is 04/11/2022   Have you experienced any side effects in the last 30 days? No   Please enter the full address (street address, city, state, zip code) where you would like your medication(s) to be delivered to. [address] [place] [address]   Please specify on which day you would like your medication(s) to arrive. Note: if you need your medication(s) within 3 days, please call the pharmacy to schedule your order at [phone]  04/08/2022   Has your insurance changed since your last refill? No   Would you like a pharmacist to call you to discuss your medication(s)? No   Do you require a signature for your package? (Note: if we are billing Medicare Part B or your order contains a controlled substance, we will require a signature) No         Completed refill call assessment today to schedule patient's medication shipment from the [facility] [phone]).  All relevant notes have been reviewed.       Confirmed patient received a Conservation officer, historic buildings and a Surveyor, mining with first shipment. The patient will receive a drug information handout for each medication shipped and additional FDA Medication Guides as required.         REFERRAL TO PHARMACIST     Referral to the pharmacist: Not needed      [facility]     Shipping address confirmed in Epic.     Delivery Scheduled: Yes, Expected medication delivery date: 04/08/22.     Medication will be delivered via UPS to the prescription address in Epic WAM.    [place] A Lonney Revak   [facility] Specialty Technician

## 2022-04-07 MED FILL — COSENTYX PEN 300 MG/2 PENS (150 MG/ML) SUBCUTANEOUS: SUBCUTANEOUS | 28 days supply | Qty: 2 | Fill #11

## 2022-04-16 NOTE — Unmapped (Signed)
Telephone Encounter  Patient was contacted to discuss mouth sores, herpes test results, and elevated A1C. This was the second attempt made at reaching the patient, and a MyChart message was sent outlining the details of what I was hoping to discuss.     Arlice Colt, PGY-1  Internal Medicine  [facility]

## 2022-04-19 ENCOUNTER — Ambulatory Visit: Admit: 2022-04-19 | Discharge: 2022-04-19 | Payer: PRIVATE HEALTH INSURANCE

## 2022-04-19 DIAGNOSIS — Z79899 Other long term (current) drug therapy: Principal | ICD-10-CM

## 2022-04-19 DIAGNOSIS — G8929 Other chronic pain: Principal | ICD-10-CM

## 2022-04-19 DIAGNOSIS — M25512 Pain in left shoulder: Principal | ICD-10-CM

## 2022-04-19 DIAGNOSIS — L4050 Arthropathic psoriasis, unspecified: Principal | ICD-10-CM

## 2022-04-19 LAB — CBC W/ AUTO DIFF
BASOPHILS ABSOLUTE COUNT: 0 10*9/L (ref 0.0–0.1)
BASOPHILS RELATIVE PERCENT: 1 %
EOSINOPHILS ABSOLUTE COUNT: 0.1 10*9/L (ref 0.0–0.5)
EOSINOPHILS RELATIVE PERCENT: 2.4 %
HEMATOCRIT: 43.7 % (ref 34.0–44.0)
HEMOGLOBIN: 14.6 g/dL (ref 11.3–14.9)
LYMPHOCYTES ABSOLUTE COUNT: 1.8 10*9/L (ref 1.1–3.6)
LYMPHOCYTES RELATIVE PERCENT: 40.8 %
MEAN CORPUSCULAR HEMOGLOBIN CONC: 33.4 g/dL (ref 32.0–36.0)
MEAN CORPUSCULAR HEMOGLOBIN: 27.7 pg (ref 25.9–32.4)
MEAN CORPUSCULAR VOLUME: 83 fL (ref 77.6–95.7)
MEAN PLATELET VOLUME: 9.3 fL (ref 6.8–10.7)
MONOCYTES ABSOLUTE COUNT: 0.4 10*9/L (ref 0.3–0.8)
MONOCYTES RELATIVE PERCENT: 8.4 %
NEUTROPHILS ABSOLUTE COUNT: 2.1 10*9/L (ref 1.8–7.8)
NEUTROPHILS RELATIVE PERCENT: 47.4 %
NUCLEATED RED BLOOD CELLS: 0 /100{WBCs} (ref <=4)
PLATELET COUNT: 168 10*9/L (ref 150–450)
RED BLOOD CELL COUNT: 5.27 10*12/L — ABNORMAL HIGH (ref 3.95–5.13)
RED CELL DISTRIBUTION WIDTH: 13.5 % (ref 12.2–15.2)
WBC ADJUSTED: 4.5 10*9/L (ref 3.6–11.2)

## 2022-04-19 LAB — C-REACTIVE PROTEIN: C-REACTIVE PROTEIN: 11 mg/L — ABNORMAL HIGH (ref <=10.0)

## 2022-04-19 LAB — ALT: ALT (SGPT): 40 U/L (ref 10–49)

## 2022-04-19 LAB — CREATININE
CREATININE: 0.44 mg/dL — ABNORMAL LOW
EGFR CKD-EPI (2021) FEMALE: >90 mL/min/{1.73_m2} (ref >=60)

## 2022-04-19 LAB — SEDIMENTATION RATE: ERYTHROCYTE SEDIMENTATION RATE: 34 mm/h — ABNORMAL HIGH (ref 0–30)

## 2022-04-19 LAB — AST: AST (SGOT): 23 U/L (ref <=34)

## 2022-04-19 LAB — ALBUMIN: ALBUMIN: 4.1 g/dL (ref 3.4–5.0)

## 2022-04-19 MED ORDER — CELECOXIB 200 MG CAPSULE
ORAL_CAPSULE | Freq: Every day | ORAL | 1 refills | 90 days | Status: CP | PRN
Start: 2022-04-19 — End: 2023-04-19

## 2022-04-19 NOTE — Unmapped (Signed)
Rheumatology return visit     REASON FOR VISIT: f/u psoriatic arthritis    HISTORY: [name] is a 57 y.o. female with hx of psoriasis (since childhood) and psoriatic arthritis. Has had peripheral and axial involvement.   Treatment hx:   - mtx w/o relief  - Enbrel with good results for 9 years, then loss of efficacy  - Enbrel changed to humira in 2015, gaps in therapy due to recurrent UTIs, found to have nephrolithiasis s/p lithotripsy and ureteral dilation.   H[facility] w/o efficacy  - Simponi w/o efficacy  - Cosentyx since 2016.   - Current med regimen: cosentyx 300 mg q 28 days monotherapy     Interim history:  Presents today for follow-up.    She feels she has not been doing well. She feels her arthritis is creeping back in. More pain in the hands and feet. Also pain in the L shoulder for 6 mo or so and worsening limitation of ROM of the shoulder. She requests a shoulder XR. She does a lot of lifting with her job, but does not have a known specific injury to the shoulder. Has had this issue with the shoulder in the past and it was attributed to psoriatic arthritis, but feels a little differently. Piercing pain when lifting the arm.     She feels weak in the hands and having trouble dropping things. Numbness in the hands and in the L shoulder. Numbness in PIPs, or maybe more stiffness than numbness.   AM stiffness x 1 hour or so. Mostly of feet and hands. Some low back pain. No nocturnal pain. Back pain better with movement.  Sees a chiropractor with good help.      Was having some psoriasis on the scalp, but this has cleared up.     CURRENT MEDICATIONS:  Current Outpatient Medications   Medication Sig Dispense Refill    clobetasoL (TEMOVATE) 0.05 % external solution Apply topically Two (2) times a day. 50 mL 2    diphenhydrAMINE 12.5 mg/5 mL Liqd 271.25 mg, hydrocortisone 2.5 MG/ML Susp 60 mg, lidocaine 2% viscous 2 % Soln 99.6 mL, nystatin 100,000 unit/mL Susp 3,000,000 Units 5 mL by Mouth route every six (6) hours as needed. Swish, gargle, spit as needed. 120 mL 0    secukinumab (COSENTYX PEN, 2 PENS,) 150 mg/mL PnIj injection Inject the contents of 2 pens (300mg  total) under the skin every 28 days 6 mL 3    valACYclovir (VALTREX) 1000 MG tablet Take 2 tablets (2,000 mg total) by mouth two (2) times a day for 7 days. 14 tablet 0     No current facility-administered medications for this visit.       Past Medical History:   Diagnosis Date    Diabetes mellitus (CMS-HCC)     Essential hypertension 01/09/2017    Hypertension     Hypothyroidism     Nephrolithiasis     Psoriasis     Typically on ears and scalp and around eyelids (arthritis is dominant finding typically for patient)    Psoriatic arthritis (CMS-HCC)     Embril for 7 years prior to starting Humara; hips, shoulders, back, hands sometimes    Vitamin D deficiency         Record Review: Available records were reviewed, including pertinent office visits, labs, and imaging.      REVIEW OF SYSTEMS: Ten system were reviewed and negative except as noted above.    PHYSICAL EXAM:  VITAL SIGNS:  Vitals:    04/19/22 1043   BP: 142/89   Pulse: 82   Temp: 36.4 ??C (97.5 ??F)   TempSrc: Temporal   Weight: 79.8 kg (176 lb)     General:   Pleasant 56 y.o.female in no acute distress, WDWN   Cardiovascular:  Regular rate and rhythm. No murmur, rub, or gallop. No lower extremity edema.    Lungs:  Clear to auscultation.Normal respiratory effort.    Musculoskeletal:   General: Ambulates w/o assistance   Hands: Bony enlargement of scattered DIPs.  No swelling. Tenderness all MCPs and PIPs b/l.  Able to make a tight fist laterally. +prayer sign.   Wrists: reduced ROM w/o swelling or tenderness b/l   Elbows: FROM w/o swelling or tenderness   Shoulders: slightly limited abduction of the L with pain. Pain with internal rotation of the L. Slightly limited internal rotation b/l.   Hips: Good ROM of hips b/l with pain in lateral hips with rotation. Negative FABER.   Knees: FROM w/o effusions. Crepitus on range of motion.  Ankles: No tenderness or swelling of achilles tendon insertions.   Feet: No pain with MTP squeeze    Psych:  Appropriate affect and mood   Skin:  No rashes.          ASSESSMENT/PLAN:  1. Psoriatic arthritis (CMS-HCC)  Worsening peripheral joint pain and stiffness w/o clear synovitis on exam. Check [place] hands to eval for synovitis. If synovitis noted on [place], consider adding mtx.   Continue cosentyx 300 mg q 28 days. Add celebrex for pain.   - XR Shoulder 3 Or More Views Left; Future  - celecoxib (CELEBREX) 200 MG capsule; Take 1 capsule (200 mg total) by mouth daily as needed for pain.  Dispense: 90 capsule; Refill: 1  - US Extremity Nonvasc Comp (Joint,Muscle,Tendon,Ligament) Bilat; Future  - C-reactive protein  - Sedimentation Rate    2. Chronic left shoulder pain  Suspect mechanical etiology. L shoulder x ray per pt request.   - XR Shoulder 3 Or More Views Left; Future    3. High risk medication use  Checking labs below to evaluate for medication toxicity.    - Albumin  - ALT  - AST  - CBC w/ Differential  - Creatinine       HCM:   - PCV13 Status: 05/31/2019  - PPSV 23 Status: 09/09/14  - PCV20: 04/2721  -COVID-19 vaccine: Pfizer 06/21/2019, 07/12/2019, 12/04/2019, 06/19/2020.  Moderna 11/18/2020. Deferred updated dose today in the setting of resolving cold   - Annual Influenza vaccine. Status: Deferred in the setting of resolving cold   - Bone health: not on prednisone   - Contraception: Postmenopausal            Return appointment in 6 mo with Dr Scarlette Calico    Greater than 30 minutes spent in visit with patient, including pre and postvisit activities.

## 2022-04-19 NOTE — Unmapped (Addendum)
Plan to get flu and COVID vaccines when your cold is resolving.     Continue cosentyx. Adding celebrex for pain. Checking ultrasound to look for inflammation that needs more immune suppression. If ultrasound shows inflammation, would consider adding methotrexate to cosentyx.

## 2022-04-20 NOTE — Unmapped (Signed)
Patient reports positive covid results.  Outreach information, along with recommendation for scheduling a virtual visit, sent via mychart and provider notified.

## 2022-04-21 ENCOUNTER — Telehealth
Admit: 2022-04-21 | Discharge: 2022-04-22 | Payer: PRIVATE HEALTH INSURANCE | Attending: Student in an Organized Health Care Education/Training Program | Primary: Student in an Organized Health Care Education/Training Program

## 2022-04-21 MED ORDER — NIRMATRELVIR 300 MG (150 MG X2)-RITONAVIR 100 MG TABLET,DOSE PACK
ORAL_TABLET | 0 refills | 0 days | Status: CP
Start: 2022-04-21 — End: ?

## 2022-04-21 NOTE — Unmapped (Signed)
Sorry you're not feeling well but glad you've improved somewhat!  Today's plan:  I have sent a prescription for Paxlovid. Please continue to drink plenty of fluids, get lots of rest, and try to eat.

## 2022-04-21 NOTE — Unmapped (Signed)
Internal Medicine Phone Visit    This visit is conducted via telephone.    Contact Information  Person Contacted: Patient  Contact Phone number: [phone] (home)   Is there someone else in the room? No.   Patient agreed to a phone visit    Ms. Grenda is a 57 y.o. female  participating in a telephone encounter.    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 Exposed 12/26-12/28, feeling truly sick 12/29    Was feeling worse last week including the weekend, but now feeling better  Was having sinus congestion and headache, body aches but unsure whether that is arthritis or COVID. Cough but rare now   Some issues with taste and smell. Still very fatigued. Some nausea (comes and goes, sometimes after eating) and diarrhea (three times)  No shortness of breath    Taking some DayQuil    I have reviewed the problem list, medications, and allergies and have updated/reconciled them if needed.    Objective:  Sounds congested and tired   Linear thought process, non-pressured speech              Assessment & Plan:  1. COVID-19             Patient presenting with six days of symptoms including sinus congestion, headache, fatigue, and mylagias. These have largely improved aside from the fatigue and dysgeusia and hyposmia. Confirmed COVID positive on 04/20/22. Overall is improving and has no risk factors for severe infection aside from immunosuppression (secukinumab). Had a discussion of risks and benefits of Paxlovid and through shared decision making would like to try it. Discussed best supportive care and to remain out of work/isolated.  Plan: Paxlovid    Return for Next scheduled follow up.     Staffed with Dr. Madelon Lips, discussed      The patient reports they are physically located in [place] and is currently: at home. I conducted a phone visit.  I spent 13 minutes on the phone call with the patient on the date of service .

## 2022-04-21 NOTE — Unmapped (Signed)
[facility]     Reason for visit: Video/telephone visit    Questions / Concerns that need to be addressed: Patient reports +covid test 1/3        Hypertension:  Have blood pressure cuff at home?: no  Regularly checking blood pressure?: no  If patient has a  record of recent blood pressures, please enter into flowsheets using this link- NA          Allergies reviewed: Yes    Medication reviewed: Yes  Pended refills? No        HCDM reviewed and updated in Epic:    We are working to make sure all of our patients??? wishes are updated in Epic and part of that is documenting a Environmental health practitioner for each patient  A Health Care Decision Maker is someone you choose who can make health care decisions for you if you are not able - who would you most want to do this for you????  was updated.        COVID-19 Vaccine Summary  Which COVID-19 Vaccine was administered  Pfizer  Type:  Dates Given:  05/14/2021     Date last COVID Positive Test:   04/20/2022    Date last mAB infusion:  07/07/2020              Immunization History   Administered Date(s) Administered    COVID-19 VAC,MRNA,TRIS(12Y UP)(PFIZER)(GRAY CAP) 06/19/2020    COVID-19 VACC,MRNA,(PFIZER)(PF) 06/21/2019, 07/12/2019, 12/04/2019, 06/19/2020, 05/14/2021    COVID-19 VACCINE,MRNA(MODERNA)(PF) 11/18/2020    DTaP, Unspecified Formulation 07/16/1967, 08/16/1967, 12/15/1968    Influenza Vaccine Quad(IM)6 MO-Adult(PF) 01/12/2015, 01/13/2016, 01/09/2017, 01/24/2020, 04/20/2021    Influenza Virus Vaccine, unspecified formulation 01/30/2018, 02/20/2019    MMR 07/26/1990, 12/29/2000    Measles 05/17/1968, 08/30/1990    Measles / Rubella 01/06/1989    Mumps 05/18/1971    PNEUMOCOCCAL POLYSACCHARIDE 23-VALENT 09/09/2014    PPD Test 11/30/2020    Pneumococcal Conjugate 13-Valent 05/31/2019    Pneumococcal Conjugate 20-valent 05/14/2021    Polio Virus Vaccine, Unspecified Formulation 07/16/1967, 09/15/1967, 11/15/1967, 12/15/1968 Rubella 07/15/1968    SHINGRIX-ZOSTER VACCINE (HZV),RECOMBINANT,ADJUVANTED(IM) 06/19/2020, 12/02/2020    TD(TDVAX),ADSORBED,2LF(IM)(PF) 12/29/2000    Td (adult) unspecified formulation 09/15/1983    TdaP 12/13/2019       __________________________________________________________________________________________

## 2022-05-03 DIAGNOSIS — L4050 Arthropathic psoriasis, unspecified: Principal | ICD-10-CM

## 2022-05-03 MED ORDER — COSENTYX PEN 300 MG/2 PENS (150 MG/ML) SUBCUTANEOUS
SUBCUTANEOUS | 3 refills | 84 days
Start: 2022-05-03 — End: ?

## 2022-05-04 MED ORDER — COSENTYX PEN 300 MG/2 PENS (150 MG/ML) SUBCUTANEOUS
SUBCUTANEOUS | 3 refills | 84 days | Status: CP
Start: 2022-05-04 — End: ?
  Filled 2022-05-09: qty 2, 28d supply, fill #0

## 2022-05-04 NOTE — Unmapped (Signed)
[facility] Refill Coordination Note    Julia Avila, DOB: 1965-10-15  Phone: [phone] (home)       All above HIPAA information was verified with patient.         05/03/2022     3:33 PM   Specialty Rx Medication Refill Questionnaire   Which Medications would you like refilled and shipped? Cosentyx   Please list all current allergies: On file   Have you missed any doses in the last 30 days? No   Have you had any changes to your medication(s) since your last refill? No   How many days remaining of each medication do you have at home? 0   If receiving an injectable medication, next injection date is 05/12/2022   Have you experienced any side effects in the last 30 days? No   Please enter the full address (street address, city, state, zip code) where you would like your medication(s) to be delivered to. [address] [place] [address]   Please specify on which day you would like your medication(s) to arrive. Note: if you need your medication(s) within 3 days, please call the pharmacy to schedule your order at [phone]  05/09/2022   Has your insurance changed since your last refill? No   Would you like a pharmacist to call you to discuss your medication(s)? No   Do you require a signature for your package? (Note: if we are billing Medicare Part B or your order contains a controlled substance, we will require a signature) No         Completed refill call assessment today to schedule patient's medication shipment from the [facility] [phone]).  All relevant notes have been reviewed.       Confirmed patient received a Conservation officer, historic buildings and a Surveyor, mining with first shipment. The patient will receive a drug information handout for each medication shipped and additional FDA Medication Guides as required.         REFERRAL TO PHARMACIST     Referral to the pharmacist: Not needed      [facility]     Shipping address confirmed in Epic.     Delivery Scheduled: Yes, Expected medication delivery date: 05/10/22.     Medication will be delivered via UPS to the prescription address in Epic WAM.    Julia Avila   [facility] Specialty Technician

## 2022-05-04 NOTE — Unmapped (Signed)
Cosentyx refill  Last Visit Date: 04/19/2022  Next Visit Date: 07/05/2022    Lab Results   Component Value Date    ALT 40 04/19/2022    AST 23 04/19/2022    ALBUMIN 4.1 04/19/2022    CREATININE 0.44 (L) 04/19/2022     Lab Results   Component Value Date    WBC 4.5 04/19/2022    HGB 14.6 04/19/2022    HCT 43.7 04/19/2022    PLT 168 04/19/2022     Lab Results   Component Value Date    NEUTROPCT 47.4 04/19/2022    LYMPHOPCT 40.8 04/19/2022    MONOPCT 8.4 04/19/2022    EOSPCT 2.4 04/19/2022    BASOPCT 1.0 04/19/2022

## 2022-05-07 MED ORDER — BLOOD-GLUCOSE METER KIT WRAPPER
11 refills | 0 days | Status: CN
Start: 2022-05-07 — End: 2023-05-07

## 2022-06-10 ENCOUNTER — Ambulatory Visit
Admit: 2022-06-10 | Discharge: 2022-06-11 | Payer: PRIVATE HEALTH INSURANCE | Attending: Rehabilitative and Restorative Service Providers" | Primary: Rehabilitative and Restorative Service Providers"

## 2022-06-10 MED ADMIN — triamcinolone acetonide (KENALOG-40) injection 40 mg: 40 mg | INTRA_ARTICULAR | @ 16:00:00 | Stop: 2022-06-10

## 2022-06-10 NOTE — Unmapped (Signed)
Plan:  - Ice affected area for 15-20 minutes 2-3 times daily  - Perform Home Exercises as described below  - If you have any questions or concerns, contact our sports team coverage line at [phone]      [employer]  Supplies:  Letta Pate ziploc bag  - Rubbing Alcohol  - Water    Combine 1?? cups of water with a ?? cup of rubbing alcohol into ziploc bag. Seal and put in the freezer for several hours or overnight.    Patient Instructions Following Cortisone Injections    A corticosteroid injection was performed for you in the office today. Your Shoulder was injected in order to reduce the pain and inflammation that you are experiencing.   Please note that not everyone will have a lasting response following the injection. A repeat injection may be needed if symptoms return, but these injections cannot be performed any sooner than 3-4 months apart.     Content of the Injection?????????????????????????????????????????????????????????The injection consists of three medications. Kenalog (an anti-inflammatory that will take 48-72 hours to take effect), Lidocaine ( a numbing agent that will last 30 minutes), and Ropivacaine (a numbing agent that will last 2-3 hours). Once the Ropivacaine wears off, you may have an increase in your pain. Icing the affected area for 20 minutes will help reduce this.  Post-injection Instructions???????????????????????????????????????????????????.It is recommended that you refrain from any high level activities using the joint or limb that was injected for approximately 4-7 days.    Possible Side Effects    Skin discoloration????????????????????????????????????..???????????????????????????Individuals with dark complexions may experience some skin discoloration locally at the site of the injection.  Steroid Flare-Up?????????????????????????????????????????????????????????????????????.There is the possibility of an increase in discomfort within 48 hours following the injection. This is called a ???flare???. To help avoid this, abide by the activity restriction above.  Infection?????????????????????????????????????????????????????????????????????????????????.There is a less than 1% chance of an infection. If you notice any signs of infection (redness, warmth, drainage, fever greater than 100 degrees) you should call [facility] immediately at [phone].    Rotator Cuff: Exercises  Your Care Instructions  Here are some examples of typical rehabilitation exercises for your condition. Start each exercise slowly. Ease off the exercise if you start to have pain.  Your doctor or physical therapist will tell you when you can start these exercises and which ones will work best for you.  How to do the exercises  Pendulum swing    Note: If you have pain in your back, do not do this exercise.  Hold on to a table or the back of a chair with your good arm. Then bend forward a little and let your sore arm hang straight down. This exercise does not use the arm muscles. Rather, use your legs and your hips to create movement that makes your arm swing freely.  Use the movement from your hips and legs to guide the slightly swinging arm back and forth like a pendulum (or elephant trunk). Then guide it in circles that start small (about the size of a dinner plate). Make the circles a bit larger each day, as your pain allows.  Do this exercise for 5 minutes, 5 to 7 times each day.  As you have less pain, try bending over a little farther to do this exercise. This will increase the amount of movement at your shoulder.    Shoulder flexion (lying down)    Note: To make a wand for this exercise, use a piece of PVC pipe or a  broom handle with the broom removed. Make the wand about a foot wider than your shoulders.  Lie on your back, holding a wand with both hands. Your palms should face down as you hold the wand.  Keeping your elbows straight, slowly raise your arms over your head. Raise them until you feel a stretch in your shoulders, upper back, and chest.  Hold for 15 to 30 seconds.  Repeat 2 to 4 times.    Shoulder rotation (lying down)    Note: To make a wand for this exercise, use a piece of PVC pipe or a broom handle with the broom removed. Make the wand about a foot wider than your shoulders.  Lie on your back. Hold a wand with both hands with your elbows bent and palms up.  Keep your elbows close to your body, and move the wand across your body toward the sore arm.  Hold for 8 to 12 seconds.  Repeat 2 to 4 times.    Shoulder abduction (one-arm jumping jack motion) lying down        Note: To make a wand for this exercise, use a piece of PVC pipe or a broom handle with the broom removed. Make the wand about a foot wider than your shoulders.  Lie on your back. Hold a wand with both hands with your elbows straight. Rest the palm of the affected arm against the end of the wand.  Keep your elbows straight and push the wand across your body toward the sore arm using only the unaffected arm.  Hold for 8 to 12 seconds.  Repeat 2 to 4 times.    Scapular exercise: Arm reach    Lie flat on your back. This exercise is a very slight motion that starts with your arms raised (elbows straight, arms straight).  From this position, reach higher toward the sky or ceiling. Keep your elbows straight. All motion should be from your shoulder blade only.  Relax your arms back to where you started.  Repeat 8 to 12 times.    Arm raise to the side with resistance band    Note: During this strengthening exercise, your arm should stay about 30 degrees to the front of your side.  Slowly raise your injured arm to the side, with your thumb facing up. Raise your arm 60 degrees at the most (shoulder level is 90 degrees).  Hold the position for 3 to 5 seconds. Then lower your arm back to your side. If you need to, bring your good arm across your body and place it under the elbow as you lower your injured arm. Use your good arm to keep your injured arm from dropping down too fast.  Repeat 8 to 12 times.  When you first start out, don't hold any extra weight in your hand. As you get stronger, you may use a 1-pound to 2-pound dumbbell or a small can of food.      Internal rotator strengthening exercise    Start by tying a piece of elastic exercise material to a doorknob. You can use surgical tubing or Thera-Band.  Stand or sit with your shoulder relaxed and your elbow bent 90 degrees. Your upper arm should rest comfortably against your side. Squeeze a rolled towel between your elbow and your body for comfort. This will help keep your arm at your side.  Hold one end of the elastic band in the hand of the painful arm.  Slowly rotate your forearm toward your body  until it touches your belly. Slowly move it back to where you started.  Keep your elbow and upper arm firmly tucked against the towel roll or at your side.  Repeat 8 to 12 times.  External rotator strengthening exercise    Start by tying a piece of elastic exercise material to a doorknob. You can use surgical tubing or Thera-Band. (You may also hold one end of the band in each hand.)  Stand or sit with your shoulder relaxed and your elbow bent 90 degrees. Your upper arm should rest comfortably against your side. Squeeze a rolled towel between your elbow and your body for comfort. This will help keep your arm at your side.  Hold one end of the elastic band with the hand of the painful arm.  Start with your forearm across your belly. Slowly rotate the forearm out away from your body. Keep your elbow and upper arm tucked against the towel roll or the side of your body until you begin to feel tightness in your shoulder. Slowly move your arm back to where you started.  Repeat 8 to 12 times.       Thank you for coming to [facility] and our clinic today!     We aim to provide you with the highest quality, individualized care.  If you have any unanswered questions after the visit, please do not hesitate to reach out to [place] on MyChart or leave a message for the nurse.  ?  MyChart messages: These messages can be sent to your provider and will be checked by their clinical support staff.? The messages are checked throughout the day during normal business hours from 8:30 am-4:00 pm Monday-Friday, however responses may take up to 48 hours.? Please use this method of communication for non-urgent and non-emergent concerns, questions, refill requests or inquiries only.? ?Our team will help respond to all of your questions.? Please note that you may be asked to see a provider by either a telehealth or in person visit if it is deemed your questions are best handled in the clinic setting in person.??  ?  Please keep in mind, these messages are not real time communications, so be patient when waiting for a response.    If you do not have access to MyChart, do not know how to use MyChart or have an issue that may require more extensive discussion, please call the nurses' call line: [phone].? This line is checked throughout the day and will be responded to as time allows.? Please note that return calls could take up to 48 hours, depending on the nature of the need.?  ?  If you have an issue that requires emergent attention that cannot wait; either call the Orthopaedics resident on call at [phone], consider coming to our [facility] walk-in clinic, or go to the nearest Emergency Department.    If you need to schedule future appointments, please call [phone].     We look forward to seeing you again in the future and appreciate you choosing [place] for your care!    Thank you,                We provide innovative and comprehensive patient centered care that is supported by evidence-based research  RESEARCH PARTICIPATION    Please check out our current research studies to see if you or someone you know may qualify at:    [email or web address]

## 2022-06-10 NOTE — Unmapped (Signed)
SPORTS MEDICINE NEW VISIT    ASSESSMENT AND PLAN       ICD-10-CM   1. Impingement syndrome of left shoulder  M75.42       PLAN  Requested Prescriptions      No prescriptions requested or ordered in this encounter     - Home exercise program provided  - Patient will use OTC medication for swelling/pain control  - Ice affected area for 15-20 minutes every hour as needed  - Corticosteroid injection administered to the L shoulder as described below  - Follow-up as needed    Return if symptoms worsen or fail to improve.    Procedure(s):  Procedure  After discussing the risks and benefits of corticosteroid injection, informed consent was obtained and a timeout was performed. The patient's left shoulder was prepped using chloraprep. Topical anesthesia was obtained using ethyl chloride spray, followed by injection into the subacromial space.  The patient tolerated the injection well and understands what to expect over the next several days.    Lg Joint Inj: L subacromial bursa on 06/10/2022 11:00 AM  Details: posterior approach  Laterality: left  Location: shoulder  Medications: 40 mg triamcinolone acetonide 40 mg/mL  Outcome: tolerated well, no immediate complications  Procedure, treatment alternatives, risks and benefits explained, specific risks discussed.     Medical Care Team Attestation: All ProcDoc orders were read back and verbally confirmed with the procedure provider, including but not limited to patient name, medication name, dose, and route, before any actions were taken.  Provider Attestation: The information documented by members of my medical care team was reviewed and verified for accuracy by me.         L Subacromial corticosteroid injection    Test Results  Imaging  Recent images of the affected area were independently interpreted by me and reveal no degenerative abnormalities, fractures, or gross deformities.    SUBJECTIVE     Chief Complaint:   Chief Complaint   Patient presents with    Left Shoulder - Pain     Left Shoulder Pain   Onset: 2-3 months   Intermittent pain   Pain increases with movement        Hand Dominance: Left    History of Present Illness: 57 y.o. female who presents for evaluation of Left shoulder pain. Inciting event: none, 2-4 months of left shoulder pain without injury in the setting of chronic psoriatic arthritis. She reports her current shoulder pain feels different from that which she typically experiences with a psoriatic flare up.   Pain is located diffuse and lateral. Discomfort is described as aching and numbness.   Aggravators: overhead reaching  Treatments tried: rest, activity modification, and OTC NSAIDs (ibuprofen, Aleve, topical votaren gel) which are/were effective    Past Medical History:   Past Medical History:   Diagnosis Date    Diabetes mellitus (CMS-HCC)     Essential hypertension 01/09/2017    Hypertension     Hypothyroidism     Nephrolithiasis     Psoriasis     Typically on ears and scalp and around eyelids (arthritis is dominant finding typically for patient)    Psoriatic arthritis (CMS-HCC)     Embril for 7 years prior to starting Humara; hips, shoulders, back, hands sometimes    Vitamin D deficiency          OBJECTIVE     Physical Exam:  Vitals:   Wt Readings from Last 3 Encounters:   04/21/22 79.8 kg (176 lb)  04/19/22 79.8 kg (176 lb)   03/18/22 79.6 kg (175 lb 8 oz)     Estimated body mass index is 33.25 kg/m?? as calculated from the following:    Height as of 04/21/22: 154.9 cm (5' 1).    Weight as of 04/21/22: 79.8 kg (176 lb).  Gen: Well-appearing female in no acute distress    MUSCULOSKELETAL       RIGHT shoulder LEFT shoulder   Inspection: No swelling, erythema, or deformity  Palpation: Tender: No tenderness to palpation  Range of motion:  normal   Special Tests: None performed. Patient is neurovascularly intact distally. Inspection: No swelling, erythema, or deformity  Palpation: Tender: No tenderness to palpation  Range of motion:  normal   Special Tests: Positive Hawkin's test, Negative Crossarm Adduction test, 5/5 supraspinatus strength  with pain, 5/5 infraspinatus strength, Negative Speed's test       MEDICAL DECISION MAKING (level of service defined by 2/3 elements)     Number/Complexity of Problems Addressed 1 acute, uncomplicated illness or injury (99203/99213)   Amount/Complexity of Data to be Reviewed/Analyzed Independent interpretation of a test performed by another physician/other qualified health care professional (99204/99214)   Risk of Complications/Morbidity/Mortality of Management Decision for MINOR Surgery (Including Injection) WITHOUT Risk Factors (99203/99213)         ADMINISTRATIVE     I have personally reviewed and interpreted the images (as available).  I have personally reviewed prior records and incorporated relevant information above (as available).      DME     DME ORDER:  Dx:  ,

## 2022-06-16 ENCOUNTER — Encounter (INDEPENDENT_AMBULATORY_CARE_PROVIDER_SITE_OTHER): Payer: Self-pay | Admitting: Ophthalmology

## 2022-08-08 NOTE — Unmapped (Signed)
[facility] Clinical Assessment & Refill Coordination Note    Julia Avila, DOB: [birth year]  Phone: [phone] (home)     All above HIPAA information was verified with patient.     Was a Nurse, learning disability used for this call? No    Specialty Medication(s):   Inflammatory Disorders: Cosentyx     Current Outpatient Medications   Medication Sig Dispense Refill    celecoxib (CELEBREX) 200 MG capsule Take 1 capsule (200 mg total) by mouth daily as needed for pain. 90 capsule 1    clobetasoL (TEMOVATE) 0.05 % external solution Apply topically Two (2) times a day. 50 mL 2    diphenhydrAMINE 12.5 mg/5 mL Liqd 271.25 mg, hydrocortisone 2.5 MG/ML Susp 60 mg, lidocaine 2% viscous 2 % Soln 99.6 mL, nystatin 100,000 unit/mL Susp 3,000,000 Units 5 mL by Mouth route every six (6) hours as needed. Swish, gargle, spit as needed. 120 mL 0    nirmatrelvir-ritonavir (PAXLOVID) 300 mg (150 mg x 2)-100 mg tablet See package instructions. 30 tablet 0    secukinumab (COSENTYX PEN, 2 PENS,) 150 mg/mL PnIj injection Inject the contents of 2 pens (300mg  total) under the skin every 28 days 6 mL 3     No current facility-administered medications for this visit.        Changes to medications: Julia Avila reports no changes at this time.    Allergies   Allergen Reactions    Nitrofurantoin Macrocrystal Rash     Large red welts all over her body    Metformin Diarrhea       Changes to allergies: No    SPECIALTY MEDICATION ADHERENCE            Specialty medication(s) dose(s) confirmed: Regimen is correct and unchanged.     Are there any concerns with adherence? Yes: patient missed 2 months of therapy, she just forgot.  She will get back on it    Adherence counseling provided? Not needed    CLINICAL MANAGEMENT AND INTERVENTION      Clinical Benefit Assessment:    Do you feel the medicine is effective or helping your condition? Yes    Clinical Benefit counseling provided? Progress note from 04/2022 shows evidence of clinical benefit    Adverse Effects Assessment:    Are you experiencing any side effects? No    Are you experiencing difficulty administering your medicine? No    Quality of Life Assessment:    Quality of Life    Rheumatology  Oncology  Dermatology  Cystic Fibrosis          How many days over the past month did your psoriatic arthritis  keep you from your normal activities? For example, brushing your teeth or getting up in the morning. She is flaring up now but will get back on the med    Have you discussed this with your provider? Yes    Acute Infection Status:    Acute infections noted within Epic:  No active infections  Patient reported infection: None    Therapy Appropriateness:    Is therapy appropriate and patient progressing towards therapeutic goals? Yes, therapy is appropriate and should be continued    DISEASE/MEDICATION-SPECIFIC INFORMATION      For patients on injectable medications: Patient currently has 0 doses left.  Next injection is scheduled for ASAP 4/24.    Chronic Inflammatory Diseases: Have you experienced any flares in the last month? Yes, patient is achy now, and has reviewed with MD  PATIENT SPECIFIC NEEDS     Does the patient have any physical, cognitive, or cultural barriers? No    Is the patient high risk? No    Did the patient require a clinical intervention? No    Does the patient require physician intervention or other additional services (i.e., nutrition, smoking cessation, social work)? No    SOCIAL DETERMINANTS OF HEALTH     At the [facility], we have learned that life circumstances - like trouble affording food, housing, utilities, or transportation can affect the health of many of our patients.   That is why we wanted to ask: are you currently experiencing any life circumstances that are negatively impacting your health and/or quality of life? Patient declined to answer    Social Determinants of Health     Financial Resource Strain: Low Risk  (12/11/2020)    Overall Financial Resource Strain (CARDIA)     Difficulty of Paying Living Expenses: Not very hard   Internet Connectivity: Not on file   Food Insecurity: No Food Insecurity (12/11/2020)    Hunger Vital Sign     Worried About Running Out of Food in the Last Year: Never true     Ran Out of Food in the Last Year: Never true   Tobacco Use: Low Risk  (04/19/2022)    Patient History     Smoking Tobacco Use: Never     Smokeless Tobacco Use: Never     Passive Exposure: Not on file   Housing/Utilities: Low Risk  (12/11/2020)    Housing/Utilities     Within the past 12 months, have you ever stayed: outside, in a car, in a tent, in an overnight shelter, or temporarily in someone else's home (i.e. couch-surfing)?: No     Are you worried about losing your housing?: No     Within the past 12 months, have you been unable to get utilities (heat, electricity) when it was really needed?: No   Alcohol Use: Not At Risk (10/30/2020)    Alcohol Use     How often do you have a drink containing alcohol?: Never     How many drinks containing alcohol do you have on a typical day when you are drinking?: 1 - 2     How often do you have 5 or more drinks on one occasion?: Never   Transportation Needs: No Transportation Needs (12/11/2020)    PRAPARE - Transportation     Lack of Transportation (Medical): No     Lack of Transportation (Non-Medical): No   Substance Use: Low Risk  (12/11/2020)    Substance Use     Taken prescription drugs for non-medical reasons: Never     Taken illegal drugs: Never     Patient indicated they have taken drugs in the past year for non-medical reasons: Yes, [positive answer(s)]: Not on file   Health Literacy: Not on file   Physical Activity: Inactive (10/07/2019)    Exercise Vital Sign     Days of Exercise per Week: 0 days     Minutes of Exercise per Session: 0 min   Interpersonal Safety: Not on file   Stress: Not on file   Intimate Partner Violence: Not At Risk (12/11/2020)    Humiliation, Afraid, Rape, and Kick questionnaire     Fear of Current or Ex-Partner: No     Emotionally Abused: No     Physically Abused: No     Sexually Abused: No   Depression: Not at risk (12/11/2020)  PHQ-2     PHQ-2 Score: 2   Social Connections: Not on file       Would you be willing to receive help with any of the needs that you have identified today? Not applicable       SHIPPING     Specialty Medication(s) to be Shipped:   Inflammatory Disorders: Cosentyx    Other medication(s) to be shipped: No additional medications requested for fill at this time     Changes to insurance: No    Delivery Scheduled: Yes, Expected medication delivery date: 4/24.     Medication will be delivered via UPS to the confirmed prescription address in [facility].    The patient will receive a drug information handout for each medication shipped and additional FDA Medication Guides as required.  Verified that patient has previously received a Conservation officer, historic buildings and a Surveyor, mining.    The patient or caregiver noted above participated in the development of this care plan and knows that they can request review of or adjustments to the care plan at any time.      All of the patient's questions and concerns have been addressed.    Julianne Rice, PharmD   [facility] Specialty Pharmacist

## 2022-08-09 MED FILL — COSENTYX PEN 300 MG/2 PENS (150 MG/ML) SUBCUTANEOUS: SUBCUTANEOUS | 28 days supply | Qty: 2 | Fill #1

## 2022-08-25 NOTE — Unmapped (Signed)
[facility] Refill Coordination Note    Julia Avila, DOB: 1965-10-27  Phone: [phone] (home)       All above HIPAA information was verified with patient.         08/24/2022     3:21 PM   Specialty Rx Medication Refill Questionnaire   Which Medications would you like refilled and shipped? Cosentyx...none on hand   Please list all current allergies: You have in computer   Have you missed any doses in the last 30 days? Yes   If Yes, please choose the number of missed doses below: 0-2   Have you had any changes to your medication(s) since your last refill? No   How many days remaining of each medication do you have at home? 0   If receiving an injectable medication, next injection date is 09/09/2022   Have you experienced any side effects in the last 30 days? No   Please enter the full address (street address, city, state, zip code) where you would like your medication(s) to be delivered to. [address], [place], [place] [address]   Please specify on which day you would like your medication(s) to arrive. Note: if you need your medication(s) within 3 days, please call the pharmacy to schedule your order at [phone]  09/09/2022   Has your insurance changed since your last refill? No   Would you like a pharmacist to call you to discuss your medication(s)? No   Do you require a signature for your package? (Note: if we are billing Medicare Part B or your order contains a controlled substance, we will require a signature) No         Completed refill call assessment today to schedule patient's medication shipment from the [facility] [phone]).  All relevant notes have been reviewed.       Confirmed patient received a Conservation officer, historic buildings and a Surveyor, mining with first shipment. The patient will receive a drug information handout for each medication shipped and additional FDA Medication Guides as required.         REFERRAL TO PHARMACIST     Referral to the pharmacist: Yes - routine compliance concerns. Patient has missed 1-3 doses of medication. Refills were scheduled and concern routed to pharmacist for evaluation.      SHIPPING     Shipping address confirmed in Epic.     Delivery Scheduled: Yes, Expected medication delivery date: 09/09/2022.     Medication will be delivered via UPS to the prescription address in Epic WAM.    Dorisann Frames   [facility] Specialty Technician

## 2022-09-07 DIAGNOSIS — L4050 Arthropathic psoriasis, unspecified: Principal | ICD-10-CM

## 2022-09-08 MED FILL — COSENTYX PEN 300 MG/2 PENS (150 MG/ML) SUBCUTANEOUS: SUBCUTANEOUS | 28 days supply | Qty: 2 | Fill #2

## 2022-09-13 ENCOUNTER — Ambulatory Visit: Admit: 2022-09-13 | Payer: PRIVATE HEALTH INSURANCE | Attending: Internal Medicine | Primary: Internal Medicine

## 2022-09-13 NOTE — Unmapped (Deleted)
Study Type: Complete Diagnostic scan performed according to EULAR/OMERACT guidelines (1,2).  Indication:  arthralgias of bilateral hands   Diagnosis code:    Diagnosis ICD-10-CM Associated Orders   1. Arthralgia of both hands  M25.541     M25.542         Study Site: {ZOXW:96045} {Blank single:19197}  Equipment: Sonosite Xporte with linear transducer    Findings:  Orthogonal views of the  {Side:17767} {Blank single:19197} were obtained in gray scale and with color power Doppler and revealed  {Blank single:19197}.  The remainder of visualized muscle, tendon, joint, bone and soft tissue were without any abnormalities.    Impressions: {Blank single:19197}      Images are stored in the Division of Rheumatology.  CPT [phone]    References:  1. Backhaus et al. Guidelines for musculoskeletal ultrasound in rheumatology.  Ann Rheum Dis [phone].  2. Galen Manila al. Musculoskeletal ultrasound including definitions for ultrasonographic pathology.  J Rheumatol 2130;86:5784-6.

## 2022-10-10 NOTE — Unmapped (Signed)
[facility] Refill Coordination Note    Julia Avila, DOB: Jun 18, 1965  Phone: [phone] (home)       All above HIPAA information was verified with patient.         10/07/2022     5:56 PM   Specialty Rx Medication Refill Questionnaire   Which Medications would you like refilled and shipped? Cosyntex   Please list all current allergies: You have on file   Have you missed any doses in the last 30 days? No   Have you had any changes to your medication(s) since your last refill? No   How many days remaining of each medication do you have at home? 0   If receiving an injectable medication, next injection date is 10/16/2022   Have you experienced any side effects in the last 30 days? No   Please enter the full address (street address, city, state, zip code) where you would like your medication(s) to be delivered to. [address] [place] [address]   Please specify on which day you would like your medication(s) to arrive. Note: if you need your medication(s) within 3 days, please call the pharmacy to schedule your order at [phone]  10/16/2022   Has your insurance changed since your last refill? No   Would you like a pharmacist to call you to discuss your medication(s)? No   Do you require a signature for your package? (Note: if we are billing Medicare Part B or your order contains a controlled substance, we will require a signature) No         Completed refill call assessment today to schedule patient's medication shipment from the [facility] [phone]).  All relevant notes have been reviewed.       Confirmed patient received a Conservation officer, historic buildings and a Surveyor, mining with first shipment. The patient will receive a drug information handout for each medication shipped and additional FDA Medication Guides as required.         REFERRAL TO PHARMACIST     Referral to the pharmacist: Not needed      [facility]     Shipping address confirmed in Epic.     Delivery Scheduled: Yes, Expected medication delivery date: 10/14/2022.     Medication will be delivered via UPS to the prescription address in Epic WAM.    Dorisann Frames   [facility] Specialty Technician

## 2022-10-13 ENCOUNTER — Ambulatory Visit: Admit: 2022-10-13 | Discharge: 2022-10-14 | Payer: PRIVATE HEALTH INSURANCE

## 2022-10-13 DIAGNOSIS — Z79899 Other long term (current) drug therapy: Principal | ICD-10-CM

## 2022-10-13 DIAGNOSIS — E1129 Type 2 diabetes mellitus with other diabetic kidney complication: Principal | ICD-10-CM

## 2022-10-13 DIAGNOSIS — L4050 Arthropathic psoriasis, unspecified: Principal | ICD-10-CM

## 2022-10-13 DIAGNOSIS — R809 Proteinuria, unspecified: Principal | ICD-10-CM

## 2022-10-13 DIAGNOSIS — Z794 Long term (current) use of insulin: Principal | ICD-10-CM

## 2022-10-13 DIAGNOSIS — G4730 Sleep apnea, unspecified: Principal | ICD-10-CM

## 2022-10-13 LAB — CREATININE
CREATININE: 0.48 mg/dL — ABNORMAL LOW
EGFR CKD-EPI (2021) FEMALE: >90 mL/min/{1.73_m2} (ref >=60)

## 2022-10-13 LAB — HEMOGLOBIN A1C
ESTIMATED AVERAGE GLUCOSE: 209 mg/dL
HEMOGLOBIN A1C: 8.9 % — ABNORMAL HIGH (ref 4.8–5.6)

## 2022-10-13 LAB — CBC W/ AUTO DIFF
BASOPHILS ABSOLUTE COUNT: 0 10*9/L (ref 0.0–0.1)
BASOPHILS RELATIVE PERCENT: 0.8 %
EOSINOPHILS ABSOLUTE COUNT: 0.1 10*9/L (ref 0.0–0.5)
EOSINOPHILS RELATIVE PERCENT: 1.5 %
HEMATOCRIT: 42.4 % (ref 34.0–44.0)
HEMOGLOBIN: 14.2 g/dL (ref 11.3–14.9)
LYMPHOCYTES ABSOLUTE COUNT: 1.9 10*9/L (ref 1.1–3.6)
LYMPHOCYTES RELATIVE PERCENT: 35.9 %
MEAN CORPUSCULAR HEMOGLOBIN CONC: 33.4 g/dL (ref 32.0–36.0)
MEAN CORPUSCULAR HEMOGLOBIN: 27.7 pg (ref 25.9–32.4)
MEAN CORPUSCULAR VOLUME: 82.7 fL (ref 77.6–95.7)
MEAN PLATELET VOLUME: 9.5 fL (ref 6.8–10.7)
MONOCYTES ABSOLUTE COUNT: 0.4 10*9/L (ref 0.3–0.8)
MONOCYTES RELATIVE PERCENT: 7.8 %
NEUTROPHILS ABSOLUTE COUNT: 2.8 10*9/L (ref 1.8–7.8)
NEUTROPHILS RELATIVE PERCENT: 54 %
PLATELET COUNT: 182 10*9/L (ref 150–450)
RED BLOOD CELL COUNT: 5.12 10*12/L (ref 3.95–5.13)
RED CELL DISTRIBUTION WIDTH: 13.4 % (ref 12.2–15.2)
WBC ADJUSTED: 5.2 10*9/L (ref 3.6–11.2)

## 2022-10-13 LAB — AST: AST (SGOT): 22 U/L (ref <=34)

## 2022-10-13 LAB — SEDIMENTATION RATE: ERYTHROCYTE SEDIMENTATION RATE: 28 mm/h (ref 0–30)

## 2022-10-13 LAB — C-REACTIVE PROTEIN: C-REACTIVE PROTEIN: 6 mg/L (ref <=10.0)

## 2022-10-13 LAB — ALT: ALT (SGPT): 48 U/L (ref 10–49)

## 2022-10-13 MED FILL — COSENTYX PEN 300 MG/2 PENS (150 MG/ML) SUBCUTANEOUS: SUBCUTANEOUS | 28 days supply | Qty: 2 | Fill #3

## 2022-10-13 NOTE — Unmapped (Signed)
Patient Name: Julia Avila  PCP: ??Reece Leader, MD  Source of History: patient and records  Date of Visit: 10/13/22 9:10 AM     Chief Compliant: follow-up for Psoriatic Arthritis    PRIOR RHEUMATOLOGIC HISTORY:?? hx of psoriasis (since childhood) and psoriatic arthritis. Has had peripheral and axial involvement.   Treatment hx:   - mtx w/o relief  - Enbrel with good results for 9 years, then loss of efficacy  - Enbrel changed to humira in 2015, gaps in therapy due to recurrent UTIs, found to have nephrolithiasis s/p lithotripsy and ureteral dilation.   Henderson [place] w/o efficacy  - Simponi w/o efficacy  - Cosentyx since 2016.   - Current med regimen: cosentyx 300 mg q 28 days monotherapy     HPI: Julia Avila is a 57 y.o. female who presents for her follow-up for psoriatic arthritis. She was last seen by me in person in August 2023 and had some mild stiffness and psoriasis activity but ultimately desired to continue on Cosentyx monotherapy. Reported worsening joint pain in follow-up in January 2024 with Carlus Pavlov [facility] and ultrasound was recommended to assess for subclinical synovitis with potentially adding methotrexate. Unfortunately, it appears the March ultrasound appointment was canceled and rescheduled for May 2024 which she did not show up for appointment. She did receive a left subacromial bursitis injection with orthopedics in February 2024. She presents for follow-up in person today.     Today, patient reports that she overslept for the ultrasound appointment. She reports feeling exhausted in general. She works as a Water engineer. She finds herself dozing off easily and is not a morning person. She does snore. She has never had assessment for sleep apnea. Discussed blood pressure elevated and sleep apnea can aggravate blood pressure. Advised her to discuss with primary and arrange for sleep apnea. She agrees to referral to Sleep Clinic today and entered. She reports missing two months of Cosentyx and has now taken two doses since when she missed it. When she missed the Cosentyx, she did not have worsening symptoms. She continues to have psoriasis in scalp with flakiness. She is having stiffness and pain in her fingers. She has not rescheduled for ultrasound. She also has pain and stiffness in her shoulders, ankles, knees and hips. She reports left shoulder injection helped but still has pain with certain movements. Has tried two doses of Celebrex and unsure if it helps.She will retry.     ROS??: Attests to the above, otherwise all other systems are negative. ??  ??  Past Medical and Surgical History:  ??  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    Grief 12/11/2020    Vitamin D deficiency 10/30/2020    Immunosuppressed status (CMS-HCC) 06/29/2020    Hypertension associated with diabetes (CMS-HCC) 01/13/2016    Bilateral hearing loss 05/01/2015    Psoriatic arthritis (CMS-HCC) 11/05/2014    Type 2 diabetes mellitus with microalbuminuria, with long-term current use of insulin (CMS-HCC) 09/09/2014    Anemia 08/08/2014     Past Surgical History:   Procedure Laterality Date    CESAREAN SECTION      LIPOSUCTION      PR COLONOSCOPY FLX DX W/COLLJ SPEC WHEN PFRMD N/A 08/11/2014    Procedure: COLONOSCOPY, FLEXIBLE, PROXIMAL TO SPLENIC FLEXURE; DIAGNOSTIC, W/WO COLLECTION SPECIMEN BY BRUSH OR WASH;  Surgeon: Tish Men, MD;  [facility];  Service: Gastroenterology    PR COLONOSCOPY W/BIOPSY SINGLE/MULTIPLE N/A 05/11/2020    Procedure: COLONOSCOPY, FLEXIBLE, PROXIMAL TO SPLENIC FLEXURE; WITH BIOPSY, SINGLE OR MULTIPLE;  Surgeon: Jarvis Morgan, MD;  Location: [facility];  Service: Gastroenterology    PR UPPER GI ENDOSCOPY,BIOPSY N/A 05/11/2020    Procedure: UGI ENDOSCOPY; WITH BIOPSY, SINGLE OR MULTIPLE;  Surgeon: Jarvis Morgan, MD;  Location: [facility]; Service: Gastroenterology    TYMPANOSTOMY TUBE PLACEMENT      WISDOM TOOTH EXTRACTION      10 years ago     Allergies:   ??  Allergies   Allergen Reactions    Nitrofurantoin Macrocrystal Rash     Large red welts all over her body    Metformin Diarrhea     Current Outpatient Medications:  ??  Current Outpatient Medications on File Prior to Visit   Medication Sig Dispense Refill    clobetasoL (TEMOVATE) 0.05 % external solution Apply topically Two (2) times a day. 50 mL 2    diphenhydrAMINE 12.5 mg/5 mL Liqd 271.25 mg, hydrocortisone 2.5 MG/ML Susp 60 mg, lidocaine 2% viscous 2 % Soln 99.6 mL, nystatin 100,000 unit/mL Susp 3,000,000 Units 5 mL by Mouth route every six (6) hours as needed. Swish, gargle, spit as needed. 120 mL 0    secukinumab (COSENTYX PEN, 2 PENS,) 150 mg/mL PnIj injection Inject the contents of 2 pens (300mg  total) under the skin every 28 days 6 mL 3    celecoxib (CELEBREX) 200 MG capsule Take 1 capsule (200 mg total) by mouth daily as needed for pain. (Patient not taking: Reported on 10/13/2022) 90 capsule 1     No current facility-administered medications on file prior to visit.     ??  Immunization History   Administered Date(s) Administered    COVID-19 VAC,MRNA,TRIS(12Y UP)(PFIZER)(GRAY CAP) 06/19/2020    COVID-19 VACC,MRNA,(PFIZER)(PF) 06/21/2019, 07/12/2019, 12/04/2019, 06/19/2020, 05/14/2021    COVID-19 VACCINE,MRNA(MODERNA)(PF) 11/18/2020    Covid-19 Vac, (66yr+) (Comirnaty) [employer]  05/09/2022    DTaP, Unspecified Formulation 07/16/1967, 08/16/1967, 12/15/1968    Influenza Vaccine Quad(IM)6 MO-Adult(PF) 01/12/2015, 01/13/2016, 01/09/2017, 01/24/2020, 04/20/2021    Influenza Virus Vaccine, unspecified formulation 01/30/2018, 02/20/2019    MMR 07/26/1990, 12/29/2000    Measles 05/17/1968, 08/30/1990    Measles / Rubella 01/06/1989    Mumps 05/18/1971    PNEUMOCOCCAL POLYSACCHARIDE 23-VALENT 09/09/2014    PPD Test 11/30/2020    Pneumococcal Conjugate 13-Valent 05/31/2019    Pneumococcal Conjugate 20-valent 05/14/2021    Polio Virus Vaccine, Unspecified Formulation 07/16/1967, 09/15/1967, 11/15/1967, 12/15/1968    Rubella 07/15/1968    SHINGRIX-ZOSTER VACCINE (HZV),RECOMBINANT,ADJUVANTED(IM) 06/19/2020, 12/02/2020    TD(TDVAX),ADSORBED,2LF(IM)(PF) 12/29/2000    Td (adult) unspecified formulation 09/15/1983    TdaP 12/13/2019     ??  PHYSICAL EXAM? -   Vital signs: BP 168/98 (BP Site: L Arm, BP Position: Sitting, BP Cuff Size: Large)  - Pulse 82  - Temp 36.1 ??C (96.9 ??F) (Temporal)  - Wt 81.1 kg (178 lb 12.8 oz)  - LMP  (LMP Unknown)  - BMI 33.78 kg/m?? Body mass index is 33.78 kg/m??.  Gen: Well-developed, well-nourished adult in no apparent distress. Normocephalic with no external signs of trauma. Pleasant and cooperative. AOx4.?  HEENT: PERRL, EOMI, oropharynx clear  Lungs: CTA bilaterally  CV: nl S1, S2, no r/m/g  Comprehensive Musculoskeletal Examination:??  Jaw, neck without limited ROM.??  Shoulders, elbows, wrists, hands, fingers:  Reports tenderness across MCPs but no swelling  or limitation. No DIP or PIP tenderness, swelling or limitation. Able to make tight fist bilaterally. Wrists, elbows and shoulders with full ROM.??  Hips with full ROM  Knees, ankles, feet, toes:No tenderness, deformity, swelling or limited ROM. Joints are cool and non tender and no pain with ROM.  Skin: Psoriasis in scalp noted with some dry flaky areas but no thick patches.    LABORATORY - medication lab monitoring needed today and results reviewed with no signs for toxicity. Rechecked HgbA1c and it is elevated. Will follow-up with patient to advise her to discuss DM management with PCP.  Recent Results (from the past 168 hour(s))   Creatinine    Collection Time: 10/13/22  9:46 AM   Result Value Ref Range    Creatinine 0.48 (L) 0.55 - 1.02 mg/dL    eGFR CKD-EPI (1610) Female >90 >=60 mL/min/1.90m2   AST    Collection Time: 10/13/22  9:46 AM   Result Value Ref Range    AST 22 <=34 U/L   ALT    Collection Time: 10/13/22 9:46 AM   Result Value Ref Range    ALT 48 10 - 49 U/L   CRP  C-Reactive Protein    Collection Time: 10/13/22  9:46 AM   Result Value Ref Range    CRP 6.0 <=10.0 mg/L   ESR Sed rate    Collection Time: 10/13/22  9:46 AM   Result Value Ref Range    Sed Rate 28 0 - 30 mm/h   Hemoglobin A1c    Collection Time: 10/13/22  9:46 AM   Result Value Ref Range    Hemoglobin A1C 8.9 (H) 4.8 - 5.6 %    Estimated Average Glucose 209 mg/dL   CBC w/ Differential    Collection Time: 10/13/22  9:46 AM   Result Value Ref Range    WBC 5.2 3.6 - 11.2 10*9/L    RBC 5.12 3.95 - 5.13 10*12/L    HGB 14.2 11.3 - 14.9 g/dL    HCT 96.0 45.4 - 09.8 %    MCV 82.7 77.6 - 95.7 fL    MCH 27.7 25.9 - 32.4 pg    MCHC 33.4 32.0 - 36.0 g/dL    RDW 11.9 14.7 - 82.9 %    MPV 9.5 6.8 - 10.7 fL    Platelet 182 150 - 450 10*9/L    Neutrophils % 54.0 %    Lymphocytes % 35.9 %    Monocytes % 7.8 %    Eosinophils % 1.5 %    Basophils % 0.8 %    Absolute Neutrophils 2.8 1.8 - 7.8 10*9/L    Absolute Lymphocytes 1.9 1.1 - 3.6 10*9/L    Absolute Monocytes 0.4 0.3 - 0.8 10*9/L    Absolute Eosinophils 0.1 0.0 - 0.5 10*9/L    Absolute Basophils 0.0 0.0 - 0.1 10*9/L     ??GENERAL SUMMARY AND IMPRESSION: ??  ??  In summary, the patient is a 57 y.o. female with psoriatic arthritis on Cosentyx 300 mg once a month monotherapy. She has missed two months in the interim but reports compliance in the past two months. Scalp with scaliness. Notes tenderness across MCPs but otherwise no overt joint swelling or limitation. Will arrange ultrasound to assess for subclinical synovitis in MCPs. Advised her to continue Cosentyx for now with NSAID for symptomatic pain relief. She reports significant fatigue and recommend sleep apnea work-up. Concern for worsening DM on review of labs and trends. Will recheck HgbA1c today and  advised her to follow-up with PCP. Of note, labs returned with HgbA1c elevation.  No signs for medication toxicity on lab monitoring completed on visit. Follow-up in 5 months with Carl[facility]lov [facility] and 10 months with myself. Return in about 10 months (around 08/13/2023).    RECOMMENDATIONS: ??  ????   Diagnosis ICD-10-CM Associated Orders   1. Psoriatic arthritis (CMS-HCC)  L40.50 CBC w/ Differential     Creatinine     AST     ALT     CRP  C-Reactive Protein     ESR Sed rate     US Extremity Nonvasc Comp (Joint,Muscle,Tendon,Ligament) Bilat     XR Hand 2 Views Bilateral      2. High risk medication use  Z79.899 CBC w/ Differential     Creatinine     AST     ALT     CRP  C-Reactive Protein     ESR Sed rate      3. Sleep apnea, unspecified type  G47.30 Ambulatory referral to Sleep Clinic     Polysomnography (with CPAP)      4. Type 2 diabetes mellitus with microalbuminuria, with long-term current use of insulin (CMS-HCC)  E11.29 Hemoglobin A1c    R80.9     Z79.4             Patient Instructions   Will get labs and hand x-ray today.    Arrange for ultrasound of the hands to assess for subclinical synovitis. Continue monthly Cosentyx for now.     Given the fatigue, snoring, recommend work-up for sleep apnea. Referral to sleep clinic and sleep study entered.    Please discuss blood pressure with primary care physician.    Will add Hemoglobin A1c to your labs for monitoring for diabetes as currently off medication. If elevated, you will need to discuss with primary care physician.   The patient indicates understanding of these issues and agrees to the plan as outlined above.  Contact information provided for any concerns or questions in the interim.  ??  I personally spent 25 minutes face-to-face and non-face-to-face in the care of this patient, which includes all pre, intra, and post visit time on the date of service.  All documented time was specific to the E/M visit and does not include any procedures that may have been performed.      Sissy Goetzke C. Scarlette Calico, MD, PhD  Assistant Professor of Medicine  Department of Medicine/Division of Rheumatology  [facility]  9:10 AM

## 2022-10-13 NOTE — Unmapped (Signed)
Will get labs and hand x-ray today.    Arrange for ultrasound of the hands to assess for subclinical synovitis. Continue monthly Cosentyx for now.     Given the fatigue, snoring, recommend work-up for sleep apnea. Referral to sleep clinic and sleep study entered.    Please discuss blood pressure with primary care physician.    Will add Hemoglobin A1c to your labs for monitoring for diabetes as currently off medication. If elevated, you will need to discuss with primary care physician.

## 2022-11-23 NOTE — Unmapped (Signed)
The [facility] has made a second and final attempt to reach this patient to refill the following medication:secukinumab: COSENTYX PEN (2 PENS) 150 mg/mL Pnij injection.      We have left voicemails on the following phone numbers: [phone], have sent a MyChart message, have sent a text message to the following phone numbers: [phone], and have sent a Mychart questionnaire..    Dates contacted: 11/04/2022-11/23/2022  Last scheduled delivery: 10/13/2022    The patient may be at risk of non-compliance with this medication. The patient should call the [facility] at [phone]  Option 4, then Option 2: Dermatology, Gastroenterology, Rheumatology to refill medication.    Craige Cotta   [facility] Specialty Technician

## 2022-11-28 NOTE — Unmapped (Incomplete)
[facility] RHEUMATOLOGY CLINIC - PHARMACIST NOTES    Received message from [facility] that they have been unable to reach patient about refill of Cosentyx. Last scheduled delivery on 10/13/22 for 1 month supply.     Called and LVM for patient. Provided [facility] contact information.    Waverly Ferrari  PharmD Candidate [address] [employer] Ambulatory Care Intern

## 2022-12-06 NOTE — Unmapped (Incomplete)
[facility] RHEUMATOLOGY CLINIC - PHARMACIST NOTES    Received message from [facility] that they have been unable to reach patient about refill of Cosentyx. Last scheduled delivery on 10/13/22 for 1 month supply.     Called and LVM for patient. Provided [facility] contact information. Second attempt to reach patient, no further attempts at this time.     Waverly Ferrari  PharmD Candidate [address] [employer] Ambulatory Care Intern

## 2022-12-20 ENCOUNTER — Institutional Professional Consult (permissible substitution)
Admit: 2022-12-20 | Discharge: 2022-12-21 | Payer: PRIVATE HEALTH INSURANCE | Attending: Internal Medicine | Primary: Internal Medicine

## 2022-12-20 DIAGNOSIS — L4050 Arthropathic psoriasis, unspecified: Principal | ICD-10-CM

## 2022-12-20 NOTE — Unmapped (Signed)
Ms. Weintraub is 57 years old with history of psoriasis and psoriatic arthritis on Cosentyx. Referred to [facility] ultrasound to evaluate for subclinical synovitis (tenderness of several MCPs). Xray hands with no radiographic evidence of inflammatory or erosive arthropathy involving the hands. Mild multifocal DIP osteoarthrosis.     Study Type: Complete Diagnostic scan performed according to EULAR/OMERACT guidelines (1,2).  Indication:  Hand pain, evaluation for subclinical synovitis   Diagnosis code:    Diagnosis ICD-10-CM Associated Orders   1. Psoriatic arthritis (CMS-HCC)  L40.50 US Extremity Nonvasc Comp (Joint,Muscle,Tendon,Ligament) Bilat        Study Site: Right 2nd MCP, PIP, DIP  Equipment: Sonosite Xporte with linear transducer    Findings:  Orthogonal views of the right 2nd MCP, PIP and DIP were obtained in gray scale and with color power doppler and revealed osteophytes. Evidence of joint space narrowing of right 2nd DIP. No evidence of joint effusion, synovial hypertrophy, CPD signal or tenosynovitis.   The remainder of visualized muscle, tendon, joint, bone and soft tissue were without any abnormalities.    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 There is no effusion or CPD signal to suggest active synovitis. No evidence of tenosynovitis.     Images are stored in the Division of Rheumatology.  CPT [phone]    References:  1. Backhaus et al. Guidelines for musculoskeletal ultrasound in rheumatology.  Ann Rheum Dis [phone].  2. Galen Manila al. Musculoskeletal ultrasound including definitions for ultrasonographic pathology.  J Rheumatol 7829;56:2130-8.

## 2022-12-20 NOTE — Unmapped (Signed)
[facility] Refill Coordination Note    Specialty Medication(s) to be Shipped:   Inflammatory Disorders: Cosentyx    Other medication(s) to be shipped: No additional medications requested for fill at this time     Julia Avila, DOB: 01-04-1966  Phone: [phone] (home)       All above HIPAA information was verified with patient.     Was a Nurse, learning disability used for this call? No    Completed refill call assessment today to schedule patient's medication shipment from the [facility] [phone]).  All relevant notes have been reviewed.     Specialty medication(s) and dose(s) confirmed: Regimen is correct and unchanged.   Changes to medications: Rheda reports no changes at this time.  Changes to insurance: No  New side effects reported not previously addressed with a pharmacist or physician: None reported  Questions for the pharmacist: No    Confirmed patient received a Conservation officer, historic buildings and a Surveyor, mining with first shipment. The patient will receive a drug information handout for each medication shipped and additional FDA Medication Guides as required.       DISEASE/MEDICATION-SPECIFIC INFORMATION        For patients on injectable medications: Patient currently has 0 doses left.  Next injection is scheduled for 12/24/22.    SPECIALTY MEDICATION ADHERENCE     Medication Adherence    Specialty Medication: COSENTYX PEN (2 PENS) 150 mg/mL Pnij injection (secukinumab)  Patient is on additional specialty medications: No  Informant: patient              Were doses missed due to medication being on hold? No     COSENTYX PEN (2 PENS) 150 mg/mL Pnij injection (secukinumab): 0 days of medicine on hand       REFERRAL TO PHARMACIST     Referral to the pharmacist: Not needed      [facility]     Shipping address confirmed in Epic.       Delivery Scheduled: Yes, Expected medication delivery date: 12/23/22.     Medication will be delivered via UPS to the prescription address in Epic WAM.    Craige Cotta   [facility] Specialty Technician

## 2022-12-22 MED FILL — COSENTYX PEN 300 MG/2 PENS (150 MG/ML) SUBCUTANEOUS: SUBCUTANEOUS | 28 days supply | Qty: 2 | Fill #4

## 2022-12-23 ENCOUNTER — Ambulatory Visit: Admit: 2022-12-23 | Discharge: 2022-12-24 | Payer: PRIVATE HEALTH INSURANCE

## 2022-12-23 DIAGNOSIS — Z1231 Encounter for screening mammogram for malignant neoplasm of breast: Principal | ICD-10-CM

## 2022-12-23 DIAGNOSIS — Z794 Long term (current) use of insulin: Principal | ICD-10-CM

## 2022-12-23 DIAGNOSIS — E1129 Type 2 diabetes mellitus with other diabetic kidney complication: Principal | ICD-10-CM

## 2022-12-23 DIAGNOSIS — R809 Proteinuria, unspecified: Principal | ICD-10-CM

## 2022-12-23 LAB — LIPID PANEL
CHOLESTEROL/HDL RATIO SCREEN: 6 — ABNORMAL HIGH (ref 1.0–4.5)
CHOLESTEROL: 265 mg/dL — ABNORMAL HIGH (ref <=200)
HDL CHOLESTEROL: 44 mg/dL (ref 40–60)
LDL CHOLESTEROL CALCULATED: 170 mg/dL — ABNORMAL HIGH (ref 40–99)
NON-HDL CHOLESTEROL: 221 mg/dL — ABNORMAL HIGH (ref 70–130)
TRIGLYCERIDES: 254 mg/dL — ABNORMAL HIGH (ref 0–150)
VLDL CHOLESTEROL CAL: 50.8 mg/dL — ABNORMAL HIGH (ref 11–40)

## 2022-12-23 LAB — ALBUMIN / CREATININE URINE RATIO
ALBUMIN QUANT URINE: 0.7 mg/dL
ALBUMIN/CREATININE RATIO: 15.4 ug/mg (ref 0.0–30.0)
CREATININE, URINE: 45.4 mg/dL

## 2022-12-23 LAB — SODIUM: SODIUM: 136 mmol/L (ref 135–145)

## 2022-12-23 LAB — CREATININE
CREATININE: 0.44 mg/dL — ABNORMAL LOW
EGFR CKD-EPI (2021) FEMALE: >90 mL/min/{1.73_m2} (ref >=60)

## 2022-12-23 LAB — POTASSIUM: POTASSIUM: 4 mmol/L (ref 3.4–4.8)

## 2022-12-23 NOTE — Unmapped (Signed)
Internal Medicine Clinic Visit    Reason for visit: Follow up    A/P:         1. Type 2 diabetes mellitus with microalbuminuria, with long-term current use of insulin (CMS-HCC)    2. Mixed hyperlipidemia    3. Hypertension associated with diabetes (CMS-HCC)    4. Encounter for screening mammogram for malignant neoplasm of breast    5. Left shoulder pain, unspecified chronicity        Type II Diabetes  Patient presenting for physical today for the first time since January 2023.  At that time she been able to come off of all diabetes medicines and had an A1c of 6%.  Unfortunately when her blood sugar was most recently checked in June 2024 while at rheumatology appointment, it was found to be elevated 8.9%.  She notes that while she done a very good job with her diet back in January 2023 since that time she has really struggled to avoid sugar.  She had done well on a largely carb/keto diet but has had issues now with regularly eating sugary foods such as candy. She notes a sense of self control around sweets.  She notes she has felt overwhelmed by medications and stopped everything she was taking several months ago aside from her medications for her rheumatoid arthritis.  We discussed today initiating treatment for her type 2 diabetes.  She notes metformin resulted in significant diarrhea that made it so it is not something she wants to try again.  She had been previously on insulin and Ozempic but notes she does not want to go back on these until she makes a concerted effort to restore her diet back to her previous habits.  She is willing to meet with a nutritionist which we will schedule today.  Encouraged her to consider diet that incorporates lean proteins, vegetables, and whole grains.  She notes wanting to try lifestyle changes for a month to see how she can do with her diet. She would like to come back for her scheduled repeat A1c check at the end of September ahead of an October appointment with me at which time we may discuss further medication options.  -Nutrition referral  --Albumin/creatinine urine ratio today  - A1c Future order with instructions to come the week of September 27   -- She notes she saw my eye doctor in [place] earlier this year; we will request records today  -- Will need foot exam at next visit    2.  Hyperlipidemia  Elevated cholesterol noted previously with previous LDL of 117 in January 2023.  Notably she has taken herself off of her atorvastatin 80 mg daily for over a year now.  She wants to see what her cholesterol values today prior to reinitiating.  She did not have any specific side effects but felt overwhelmed by having to be on medications in the long-term.  Discussed the benefits of statins including reduce risk of heart attack and stroke with patient today.  - Lipid panel  - Consider resumption of atorvastatin 80 mg daily    3.  Hypertension  Patient blood pressure elevated 140/94 today.  Previously she had been on lisinopril 10 mg daily, but she has been off of this now for several months as well.  She does not want to resume medications until she incorporates her lifestyle adjustments discussed previously so we will plan to discuss at further appointments pending lifestyle modifications.  Discussed benefits of renal protection  with ACE/ARB as well as reduction of risk of hypertension and stroke today.  - Encouraged home monitoring of blood pressure  - Return for care in 1 month for consideration of antihypertensives  - Hypertension 3 panel    4. Left Shoulder Pain  She notes that left shoulder pain that had bothered her back in the winter 2024 has recurred recently.  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 She describes it as a pinching sensation in the anterior part of her left shoulder and discomfort with lowering it from a raised position.  Imaging in January of 2024 had suggested mild osseous changes at the greater tuberosity possible seen in rotator cuff tendinopathy. She benefited from a steroid injection into the left subacromial bursa in February 2024 and requested the phone number to call back to [facility] orthopedic/sports medicine for follow-up. That number was provided today.  -Follow up with [facility]    4.  Healthcare maintenance  -Mammogram ordered     Things to discuss at next visit  -Depression  -Vitamin D    Return in about 4 weeks (around 01/20/2023).    Staffed with Dr. Altamease Oiler, discussed    __________________________________________________________    HPI:    She notes her diabetes has gotten worse in recent in the last year. She was doing carnivore diet and later keto for a while and saw improvement in numbers but then she resumed eating more candy and found it triggered what she felt like was a sugar addiction. She now really struggles with stopping herself from eating sugar. She would be interested in seeing a nutritionist but notes she had disagreements last time with them about whether she should be eating things labeled as keto in a way that made her not feel heard. She does not want to start medicine back until she tries to get her diet back in order.     She was using the CGM but has not done that in quite a while since she went off insulin. She also stopped taking blood pressure medicine and her statin because she  disliked being on medication..     She notes her left shoulder has started hurting in the last few weeks after feeling better following a steroid injection in February. It feels similar to how it did at that time with       [facility] is where she goes for eye monitoring. She most recently went to their office in [place].         __________________________________________________________        Medications:  Reviewed in EPIC  __________________________________________________________    Physical Exam:   Vital Signs:  Vitals:    12/23/22 0850   BP: 140/94   BP Site: L Arm   BP Position: Sitting   BP Cuff Size: Medium   Pulse: 78   Resp: 18   Temp: 36.8 ??C (98.2 ??F)   TempSrc: Oral   SpO2: 96%   Weight: 79.8 kg (176 lb)   Height: 154.9 cm (5' 1)          Gen: Well appearing, NAD  CV: RRR, no murmurs  Pulm: CTA bilaterally, no crackles or wheezes  Abd: Soft, NTND, normal BS.   Ext: No edema  Shoulder: Negative empty can test. No tenderness on palpation. Joint grossly normal.       Medication adherence and barriers to the treatment plan  have been addressed. Opportunities to optimize healthy behaviors have been discussed. Patient / caregiver voiced understanding.

## 2022-12-23 NOTE — Unmapped (Addendum)
It was nice to see you today    For your diabetes, I will put in a referral to our nutritionist for you to meet with them. Keep working to limit sugar carbs. We will also check your kidney function and your cholesterol.    Please go to a [facility] in [place] or [place] around September 27th to get your A1c checked again.     For your shoulder the number to call for follow up with them is [phone]     I have referred you for a mammogram. They will call you to schedule that.

## 2022-12-30 NOTE — Unmapped (Signed)
Attempted to call patient on 9/10 and 9/12 to discuss lab follow up and recommendation to start statin. No response on either call. Will send repeat my chart message.

## 2023-01-11 NOTE — Unmapped (Signed)
I reviewed with the resident the medical history and the resident???s findings on physical examination.  I discussed with the resident the patient???s diagnosis and concur with the treatment plan as documented in the resident note. Trina Ao, MD

## 2023-01-16 ENCOUNTER — Ambulatory Visit: Admit: 2023-01-16 | Discharge: 2023-01-17 | Payer: PRIVATE HEALTH INSURANCE

## 2023-01-17 ENCOUNTER — Ambulatory Visit: Admit: 2023-01-17 | Discharge: 2023-01-18 | Payer: PRIVATE HEALTH INSURANCE

## 2023-01-17 DIAGNOSIS — E1129 Type 2 diabetes mellitus with other diabetic kidney complication: Principal | ICD-10-CM

## 2023-01-17 DIAGNOSIS — R809 Proteinuria, unspecified: Principal | ICD-10-CM

## 2023-01-17 DIAGNOSIS — Z794 Long term (current) use of insulin: Principal | ICD-10-CM

## 2023-01-17 NOTE — Unmapped (Signed)
Orders for lab 

## 2023-01-21 NOTE — Unmapped (Signed)
[facility] and Home Delivery Pharm[facility] Refill Coordination Note    Julia Avila, DOB: 09-19-1965  Phone: [phone] (home)       All above HIPAA information was verified with patient.         01/17/2023     9:49 PM   Specialty Rx Medication Refill Questionnaire   Which Medications would you like refilled and shipped? Cosyntex   Please list all current allergies: In your system   Have you missed any doses in the last 30 days? No   Have you had any changes to your medication(s) since your last refill? No   How many days remaining of each medication do you have at home? 0   If receiving an injectable medication, next injection date is 01/21/2023   Have you experienced any side effects in the last 30 days? No   Please enter the full address (street address, city, state, zip code) where you would like your medication(s) to be delivered to. [address] [place] [address]   Please specify on which day you would like your medication(s) to arrive. Note: if you need your medication(s) within 3 days, please call the pharmacy to schedule your order at [phone]  01/21/2023   Has your insurance changed since your last refill? No   Would you like a pharmacist to call you to discuss your medication(s)? No   Do you require a signature for your package? (Note: if we are billing Medicare Part B or your order contains a controlled substance, we will require a signature) No         Completed refill call assessment today to schedule patient's medication shipment from the [facility] [phone]).  All relevant notes have been reviewed.       Confirmed patient received a Conservation officer, historic buildings and a Surveyor, mining with first shipment. The patient will receive a drug information handout for each medication shipped and additional FDA Medication Guides as required.         REFERRAL TO PHARMACIST     Referral to the pharmacist: Not needed      [facility]     Shipping address confirmed in Epic. Delivery Scheduled: Yes, Expected medication delivery date: 01/25/23.     Medication will be delivered via UPS to the prescription address in Epic WAM.    Craige Cotta   [facility] Home Delivery Pharmacy Specialty Technician

## 2023-01-23 DIAGNOSIS — E1159 Type 2 diabetes mellitus with other circulatory complications: Principal | ICD-10-CM

## 2023-01-23 DIAGNOSIS — I152 Hypertension secondary to endocrine disorders: Principal | ICD-10-CM

## 2023-01-25 MED FILL — COSENTYX PEN 300 MG/2 PENS (150 MG/ML) SUBCUTANEOUS: SUBCUTANEOUS | 28 days supply | Qty: 2 | Fill #5

## 2023-02-01 ENCOUNTER — Ambulatory Visit
Admit: 2023-02-01 | Discharge: 2023-02-02 | Payer: PRIVATE HEALTH INSURANCE | Attending: Rehabilitative and Restorative Service Providers" | Primary: Rehabilitative and Restorative Service Providers"

## 2023-02-01 DIAGNOSIS — S46002D Unspecified injury of muscle(s) and tendon(s) of the rotator cuff of left shoulder, subsequent encounter: Principal | ICD-10-CM

## 2023-02-01 NOTE — Unmapped (Signed)
Plan:  - Ice affected area for 15-20 minutes 2-3 times daily  - Follow up 2-3 days after your MRI to discuss results and further treatment options  - If you have any questions or concerns, contact our sports team coverage line at [phone]    MRI Scheduling and follow up instructions:    Your [facility]opa[facility] Provider has ordered an MRI test for you. To schedule your MRI with [facility], or [facility], please call them at [phone], option 1.    OR    To schedule your MRI at our [facility] location, please call [phone].  [facility]  [address]  [address]  [place], [place] [address]    Business Hours  Monday through Friday 8:00 a.m. - 5:00 p.m.      Once the MRI is scheduled, call [phone] to schedule an in-person visit for 1-2 days after the MRI to discuss your results.    Thank you ,    [facility]        Thank you for coming to [facility] and our clinic today!     We aim to provide you with the highest quality, individualized care.  If you have any unanswered questions after the visit, please do not hesitate to reach out to [place] on MyChart or leave a message for the nurse.  ?  MyChart messages: These messages can be sent to your provider and will be checked by their clinical support staff.? The messages are checked throughout the day during normal business hours from 8:30 am-4:00 pm Monday-Friday, however responses may take up to 48 hours.? Please use this method of communication for non-urgent and non-emergent concerns, questions, refill requests or inquiries only.? ?Our team will help respond to all of your questions.? Please note that you may be asked to see a provider by either a telehealth or in person visit if it is deemed your questions are best handled in the clinic setting in person.??  ?  Please keep in mind, these messages are not real time communications, so be patient when waiting for a response.    If you do not have access to MyChart, do not know how to use MyChart or have an issue that may require more extensive discussion, please call the nurses' call line: [phone].? This line is checked throughout the day and will be responded to as time allows.? Please note that return calls could take up to 48 hours, depending on the nature of the need.?  ?  If you have an issue that requires emergent attention that cannot wait; either call the Orthopaedics resident on call at [phone], consider coming to our [facility] walk-in clinic, or go to the nearest Emergency Department.    If you need to schedule future appointments, please call [phone].     We look forward to seeing you again in the future and appreciate you choosing [facility] for your care!    Thank you,                We provide innovative and comprehensive patient centered care that is supported by evidence-based research  RESEARCH PARTICIPATION    Please check out our current research studies to see if you or someone you know may qualify at:    [email or web address]

## 2023-02-01 NOTE — Unmapped (Signed)
SPORTS MEDICINE RETURN VISIT    ASSESSMENT AND PLAN       ICD-10-CM   1. Injury of left rotator cuff, subsequent encounter  S46.002D       PLAN  Requested Prescriptions      No prescriptions requested or ordered in this encounter     Continue home exercises/formal PT/OT  - Patient will use OTC medication for swelling/pain control  - Ice affected area for 15-20 minutes every hour as needed  - Obtain further imaging, MRI to assess for rotator cuff pathology in the setting of temporary relief with steroid injection, NSAIDs, and home exercises  - Follow-up 2-3 days after the MRI scan to discuss results and further treatment options in-person or via phone visit    Return for follow-up to discuss MRI results.    Procedure(s):  None    Test Results  Imaging  None performed today.    SUBJECTIVE     Chief Complaint:   Chief Complaint   Patient presents with    Left Shoulder - Follow-up, Pain     Still having pain. Injection helped but has worn off. Limiting ADLs currently. Worried about future of her shoulder.        Hand Dominance: Right    History of Present Illness: 57 y.o. female who presents for follow-up of Left shoulder pain. Inciting event: unknown, the patient was last seen in February 2024 where she received a subacromial steroid injection to the left shoulder, which provided relief for a few months, but the pain has since returned and she continues to have pain with any movements away from body line.   Pain is located lateral. Discomfort is described as aching, burning, sharp/stabbing, and throbbing.   Aggravators: overhead reaching, reaching behind  Treatments tried: Ice, OTC NSAIDs (ibuprofen, Aleve, topical votaren gel) which are/were not very effective, self-guided exercises which are/were not very effective, and corticosteroid injection(s) which are/were somewhat effective    Past Medical History:   Past Medical History:   Diagnosis Date    Diabetes mellitus (CMS-HCC)     Essential hypertension 01/09/2017 Hypertension     Hypothyroidism     Nephrolithiasis     Psoriasis     Typically on ears and scalp and around eyelids (arthritis is dominant finding typically for patient)    Psoriatic arthritis (CMS-HCC)     Embril for 7 years prior to starting Humara; hips, shoulders, back, hands sometimes    Vitamin D deficiency          OBJECTIVE     Physical Exam:  Vitals:   Wt Readings from Last 3 Encounters:   12/23/22 79.8 kg (176 lb)   10/13/22 81.1 kg (178 lb 12.8 oz)   04/21/22 79.8 kg (176 lb)     Estimated body mass index is 33.25 kg/m?? as calculated from the following:    Height as of 12/23/22: 154.9 cm (5' 1).    Weight as of 12/23/22: 79.8 kg (176 lb).  Gen: Well-appearing female in no acute distress    MUSCULOSKELETAL       RIGHT shoulder LEFT shoulder   Inspection: No swelling, erythema, or deformity  Palpation: Tender: No tenderness to palpation  Range of motion:  normal   Special Tests: None performed. Patient is neurovascularly intact distally. Inspection: No swelling, erythema, or deformity  Palpation: Tender: No tenderness to palpation  Range of motion:  150 degrees flexion, 90 degrees abduction, 50 degrees internal rotation, and 80 degrees external rotation  Special Tests: Positive Neer's test, 4+/5 Supraspinatus strength with pain, 5/5 infraspinatus strength, Negative Speed's test       MEDICAL DECISION MAKING (level of service defined by 2/3 elements)     Number/Complexity of Problems Addressed 1 undiagnosed new problem with uncertain prognosis (99204/99214)   Amount/Complexity of Data to be Reviewed/Analyzed 2 points: Review prior notes (1 point per unique source); Review test results (1 point per unique test); Order tests (1 point per unique test) (99203/99213)   Risk of Complications/Morbidity/Mortality of Management Physical Therapy/Occupational Therapy (99203/99213)         ADMINISTRATIVE     I have personally reviewed and interpreted the images (as available).  I have personally reviewed prior records and incorporated relevant information above (as available).      DME     DME ORDER:  Dx:  ,

## 2023-02-03 DIAGNOSIS — Z794 Long term (current) use of insulin: Principal | ICD-10-CM

## 2023-02-03 DIAGNOSIS — E782 Mixed hyperlipidemia: Principal | ICD-10-CM

## 2023-02-03 DIAGNOSIS — E1129 Type 2 diabetes mellitus with other diabetic kidney complication: Principal | ICD-10-CM

## 2023-02-03 DIAGNOSIS — R809 Proteinuria, unspecified: Principal | ICD-10-CM

## 2023-02-03 MED ORDER — ATORVASTATIN 40 MG TABLET
ORAL_TABLET | Freq: Every day | ORAL | 3 refills | 90 days
Start: 2023-02-03 — End: 2024-02-03

## 2023-02-06 MED ORDER — ATORVASTATIN 20 MG TABLET
ORAL_TABLET | ORAL | 11 refills | 30 days | Status: CP
Start: 2023-02-06 — End: 2024-02-06

## 2023-02-21 DIAGNOSIS — H9193 Unspecified hearing loss, bilateral: Principal | ICD-10-CM

## 2023-02-22 ENCOUNTER — Ambulatory Visit: Admit: 2023-02-22 | Discharge: 2023-02-23 | Payer: BLUE CROSS/BLUE SHIELD

## 2023-02-22 DIAGNOSIS — L4050 Arthropathic psoriasis, unspecified: Principal | ICD-10-CM

## 2023-02-22 DIAGNOSIS — I152 Hypertension secondary to endocrine disorders: Principal | ICD-10-CM

## 2023-02-22 DIAGNOSIS — M19042 Primary osteoarthritis, left hand: Principal | ICD-10-CM

## 2023-02-22 DIAGNOSIS — M19041 Primary osteoarthritis, right hand: Principal | ICD-10-CM

## 2023-02-22 DIAGNOSIS — Z794 Long term (current) use of insulin: Principal | ICD-10-CM

## 2023-02-22 DIAGNOSIS — R7989 Other specified abnormal findings of blood chemistry: Principal | ICD-10-CM

## 2023-02-22 DIAGNOSIS — E1159 Type 2 diabetes mellitus with other circulatory complications: Principal | ICD-10-CM

## 2023-02-22 DIAGNOSIS — E1129 Type 2 diabetes mellitus with other diabetic kidney complication: Principal | ICD-10-CM

## 2023-02-22 DIAGNOSIS — R809 Proteinuria, unspecified: Principal | ICD-10-CM

## 2023-02-22 LAB — HEMOGLOBIN A1C
ESTIMATED AVERAGE GLUCOSE: 154 mg/dL
HEMOGLOBIN A1C: 7 % — ABNORMAL HIGH (ref 4.8–5.6)

## 2023-02-22 LAB — TSH: THYROID STIMULATING HORMONE: 6.867 u[IU]/mL — ABNORMAL HIGH (ref 0.550–4.780)

## 2023-02-22 NOTE — Unmapped (Signed)
Ok to use ibuprofen and/or tylenol when needed for pain.   Continue cosentyx.

## 2023-02-22 NOTE — Unmapped (Signed)
Rheumatology return visit     REASON FOR VISIT: f/u psoriatic arthritis    HISTORY: Julia Avila is a 57 y.o. female with hx of psoriasis (since childhood) and psoriatic arthritis. Has had peripheral and axial involvement.   Treatment hx:   - mtx w/o relief  - Enbrel with good results for 9 years, then loss of efficacy  - Enbrel changed to humira in 2015, gaps in therapy due to recurrent UTIs, found to have nephrolithiasis s/p lithotripsy and ureteral dilation.   Henderson [place] w/o efficacy  - Simponi w/o efficacy  - Cosentyx since 2016.   - [place] 12/2022 showing OA but no synovitis of hands   - Current med regimen: cosentyx 300 mg q 28 days monotherapy     Interim history:  Presents today for follow-up.    Has stiffness in hands and feet. Sometimes in hips. Has been feeling more stiffness and feet and hands recently. No better or worse with activity.  Endorses difficulty with dropping things, feels she cannot grip things strong enough.  Stopped taking Celebrex because it was not helpful.    Pain today in the hands.  Has been following orthopedics for left shoulder pain.  Diagnosed with rotator cuff disease.  Has been referred to physical therapy for this.      Has been picking at skin on arms. Has a spot of flaking on the scalp. Ears are doing better, but still has cracking skin on the back of the R ear better with hydrocortisone.         CURRENT MEDICATIONS:  Current Outpatient Medications   Medication Sig Dispense Refill    clobetasoL (TEMOVATE) 0.05 % external solution Apply topically Two (2) times a day. 50 mL 2    diphenhydrAMINE 12.5 mg/5 mL Liqd 271.25 mg, hydrocortisone 2.5 MG/ML Susp 60 mg, lidocaine 2% viscous 2 % Soln 99.6 mL, nystatin 100,000 unit/mL Susp 3,000,000 Units 5 mL by Mouth route every six (6) hours as needed. Swish, gargle, spit as needed. 120 mL 0    secukinumab (COSENTYX PEN, 2 PENS,) 150 mg/mL PnIj injection Inject the contents of 2 pens (300mg  total) under the skin every 28 days 6 mL 3 atorvastatin (LIPITOR) 20 MG tablet Take 1 tablet (20 mg total) by mouth daily. (Patient not taking: Reported on 02/22/2023) 30 tablet 11    celecoxib (CELEBREX) 200 MG capsule Take 1 capsule (200 mg total) by mouth daily as needed for pain. (Patient not taking: Reported on 02/22/2023) 90 capsule 1     No current facility-administered medications for this visit.       Past Medical History:   Diagnosis Date    Diabetes mellitus (CMS-HCC)     Essential hypertension 01/09/2017    Hypertension     Hypothyroidism     Nephrolithiasis     Psoriasis     Typically on ears and scalp and around eyelids (arthritis is dominant finding typically for patient)    Psoriatic arthritis (CMS-HCC)     Embril for 7 years prior to starting Humara; hips, shoulders, back, hands sometimes    Vitamin D deficiency         Record Review: Available records were reviewed, including pertinent office visits, labs, and imaging.      REVIEW OF SYSTEMS: Ten system were reviewed and negative except as noted above.    PHYSICAL EXAM:  VITAL SIGNS:   Vitals:    02/22/23 0844   BP: 143/91   Pulse: 78   Weight: 78  kg (172 lb)     General:   Pleasant 57 y.o.female in no acute distress, WDWN   Cardiovascular:  Regular rate and rhythm. No murmur, rub, or gallop. No lower extremity edema.    Lungs:  Clear to auscultation.Normal respiratory effort.    Musculoskeletal:   General: Ambulates w/o assistance   Hands: Bony enlargement of scattered DIPs.  No swelling. Tenderness PIP 1-4 on right and 1-3 on the left.  Tenderness all DIPs.  Able to make a tight fist laterally. +prayer sign.   Wrists: reduced ROM w/o swelling.  Tenderness of the left.  Elbows: FROM w/o swelling or tenderness   Shoulders: Painful range of motion bilaterally  Hips: Good ROM of hips b/l. Negative FABER.   Knees: FROM w/o effusions.  Cool bilaterally.  Crepitus on range of motion.  Ankles: No swelling bilaterally.  Tenderness bilaterally  Feet: No pain with MTP squeeze    Psych:  Appropriate affect and mood   Skin:  No psoriasis of scalp noted         ASSESSMENT/PLAN:  1. Psoriatic arthritis (CMS-HCC)  Stable.  Continue Cosentyx 300 mg q. 28 days.    2. Primary osteoarthritis of both hands  Discussed the difference between osteoarthritis and psoriatic arthritis as well as the differences in treatment strategies.  Discussed that the only disease modifying therapy for osteoarthritis is physical activity.  Pharmacotherapy options include Tylenol, NSAIDs, Cymbalta.  She would prefer to use as needed medications.  She prefers to start with over-the-counter ibuprofen as needed.  She may also combine this with Tylenol if needed.  Referral to occupational therapy to help with grip strength and improving range of motion in fingers, or with strategies for functioning with reduced range of motion.  - Ambulatory referral to Occupational Therapy; Future       HCM:   - PCV13 Status: 05/31/2019  - PPSV 23 Status: 09/09/14  - PCV20: 04/2721  -COVID-19 vaccine: Up-to-date per patient  - Annual Influenza vaccine. Status: Up-to-date per patient  - Bone health: not on prednisone   - Contraception: Postmenopausal          Return appointment in 5 mo with Julia Avila    Greater than 30 minutes spent in visit with patient, including pre and postvisit activities.

## 2023-02-23 LAB — VITAMIN D 25 HYDROXY: VITAMIN D, TOTAL (25OH): 16.7 ng/mL — ABNORMAL LOW (ref 20.0–80.0)

## 2023-02-23 MED FILL — COSENTYX PEN 300 MG/2 PENS (150 MG/ML) SUBCUTANEOUS: SUBCUTANEOUS | 28 days supply | Qty: 2 | Fill #6

## 2023-02-23 NOTE — Unmapped (Signed)
[facility] Specialty and Home Delivery Pharmacy Refill Coordination Note    Julia Avila, DOB: May 14, 1965  Phone: [phone] (home)       All above HIPAA information was verified with patient.         02/22/2023     4:26 PM   Specialty Rx Medication Refill Questionnaire   Which Medications would you like refilled and shipped? Cosentyx   Please list all current allergies: Metformin,   Have you missed any doses in the last 30 days? No   Have you had any changes to your medication(s) since your last refill? No   How many days remaining of each medication do you have at home? 5   If receiving an injectable medication, next injection date is 02/26/2023   Have you experienced any side effects in the last 30 days? No   Please enter the full address (street address, city, state, zip code) where you would like your medication(s) to be delivered to. [address] [place] [address]   Please specify on which day you would like your medication(s) to arrive. Note: if you need your medication(s) within 3 days, please call the pharmacy to schedule your order at [phone]  02/24/2023   Has your insurance changed since your last refill? No   Would you like a pharmacist to call you to discuss your medication(s)? No   Do you require a signature for your package? (Note: if we are billing Medicare Part B or your order contains a controlled substance, we will require a signature) No         Completed refill call assessment today to schedule patient's medication shipment from the [facility] Specialty and Home Delivery Pharmacy [phone]).  All relevant notes have been reviewed.       Confirmed patient received a Conservation officer, historic buildings and a Surveyor, mining with first shipment. The patient will receive a drug information handout for each medication shipped and additional FDA Medication Guides as required.         REFERRAL TO PHARMACIST     Referral to the pharmacist: Not needed      [facility]     Shipping address confirmed in Epic. Delivery Scheduled: Yes, Expected medication delivery date: 02/24/2023.     Medication will be delivered via UPS to the prescription address in Epic WAM.    Dorisann Frames   [facility] Specialty and Home Delivery Pharmacy Specialty Technician

## 2023-03-03 ENCOUNTER — Institutional Professional Consult (permissible substitution): Admit: 2023-03-03 | Discharge: 2023-03-04 | Payer: BLUE CROSS/BLUE SHIELD

## 2023-03-03 NOTE — Unmapped (Signed)
[facility]  Department of Audiology    AUDIOLOGIC EVALUATION REPORT     PATIENT: Julia, Avila  DOB: Nov 24, 1965  MRN: [id number]  DOS: 03/03/2023    PLEASE SEE AUDIOGRAM INCLUDING FULL REPORT IN MEDIA MANAGER TAB.    HISTORY     Julia Avila is a 56 y.o. individual who presents to [facility] for an audiologic evaluation. Patient was unaccompanied to today's appointment. Her medical history is significant for hearing loss. Today, patient reports she feels she is constantly hearing as if she were in a 'tunnel'. She reports a constant buzzing that is occasionally bothersome. She reports consistent aural fullness and feels like her ears need to 'pop'. She reports a history of ear infections as a child which resulted in bilateral PE tube placement in childhood. She notes communication difficulty and that she has to ask people to repeat themselves often. She feels that she avoids social situations because of her hearing loss. Her mother developed hearing loss later in life. She denies noise exposure and dizziness.    Patient is referred by Jobe Igo Kimel-S*.     Patient reports a history of:   positive history for ear infections/ear drainage/otorrhea    negative history for noise exposure   positive family history of hearing loss   positive history of ear surgery   negative history of amplification     Today patient reports the following symptoms:   negative for otalgia    positive for aural fullness   negative for dizziness   positive for tinnitus    positive for hearing loss    SUBJECTIVE QUESTIONNAIRE(S)     Patient's responses to tinnitus screening questions are as follows: How much of a problem is your tinnitus: Big problem Does tinnitus affect your sleep: Unsure as she has trouble sleeping regularly. Does your tinnitus change with movements of the head, neck, or jaw: Yes.    RESULTS     Otoscopy  RIGHT Ear: clear external auditory canal  LEFT Ear: clear external auditory canal    Tympanometry - using a 226 Hz probe tone  RIGHT Ear: Type A tympanogram, consistent with normal middle ear pressure, compliance, and volume  LEFT Ear: Type A tympanogram, consistent with normal middle ear pressure, compliance, and volume    Pure Tone and Speech Audiometry  Today's behavioral evaluation was completed using conventional audiometry via insert earphones with good reliability.     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     NO asymmetry is noted in thresholds between ears.  NO asymmetry is noted between ears for word recognition scores today (per SPRINT chart data). Today's word recognition score in ear demonstrated stable performance when compared with score in previous audiogram on 01/09/17 (per SPRINT chart data).     Patient was counseled on today's results via verbal communication and expressed understanding.  No barriers to education were identified.    RECOMMENDATIONS      Refer  to ENT Re: aural fullness; sinus pressure   Consider Tinnitus Evaluation with [facility] Adult Audiology team at [facility]    Darl Householder, AUD, AuD  Clinical Audiologist  [facility] Adult Audiology Program  Scheduling [phone]    Procedure(s):    CPT [phone] - Comprehensive Audio Eval & Speech Recognition    CPT [phone] - Tympanometry    HC No Charge; Qty: 1    Visit Time: 60 min    Access your Audiogram via MyChart:  - Log into myChart: Menu > Document Center > Medical Record Requests  - Click the hyperlink 'Request Medical Records'   - Fill out fields + Select Audiograms option under 'Specific Diagnostic Images' > Submit

## 2023-03-06 ENCOUNTER — Ambulatory Visit: Admit: 2023-03-06 | Discharge: 2023-03-07 | Payer: BLUE CROSS/BLUE SHIELD

## 2023-03-23 ENCOUNTER — Ambulatory Visit
Admit: 2023-03-23 | Payer: BLUE CROSS/BLUE SHIELD | Attending: Rehabilitative and Restorative Service Providers" | Primary: Rehabilitative and Restorative Service Providers"

## 2023-03-23 NOTE — Unmapped (Signed)
OUTPATIENT OCCUPATIONAL THERAPY    UPPER EXTREMITY EVALUATION    Patient Name: Julia Avila  Date of Birth:01/27/1966  Date: 03/23/2023  Visit #: 1  Insurance Type: bcbs/bluehome  Plan of Care Certification Dates:     Encounter Diagnosis   Name Primary?    Primary osteoarthritis of both hands      Reason for referral: evaluation and treatment   Referring Provider: Staci Righter  Onset of Symptoms:  Per Referring Provider's note: none available    Communication preference: verbal, written, visual  Prognosis: good due to motivation, health status     OT ASSESSMENT:   57 y.o. year old female with above diagnosis.  Patient requires skilled Occupational Therapy services for decreased range of motion, decreased strength, orthotic fit/management, impaired daily activities of living as appropriate.     Previous Level of Function: Pt was previously independent with all ADLs and IADLs.     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  This patient has no co-morbidities affecting occupational performance.  A problem focused assessment was performed.  Please refer to Current level of function section for further details.      PLAN  Short Term Goals:  1. In 1 session, patient will perform home exercise program with need for cuing to max IND with ADLs and IADLs. (met)    Long Term Goals:   1. In 8 weeks, patient will perform upgraded home exercise program, to include progression to strengthening, independently  to max IND with ADLs and IADLs.    2. In 8weeks, patient will be independent with joint protection principles and use of adaptive equipment to be independent with I/ADLs.    OT  PLAN OF CARE:  Pt will participate in:  Self Care/Hometraining  Orthotic Fit/Management   Therapeutic Exercise  Therapeutic Activity   Neuromuscular Re-education  Ultrasound  Hot/Cold Pack  Electrical Stimulation  Iontophoresis  Orthotic/Prosthetic Measure and Fit   Joint Mobilization  Physical Performance Measure   Manual Therapy    Planned frequency and duration of treatment: Pt will likely be seen for only evaluation. Will return if problems arise. Plan will be adjusted as necessary.     Patient in agreement with plan of care?: Yes    SUBJECTIVE:  Patient goals: to get a new program for keeping my arms and hands doing well    Patient reports: she is not as active has she used to be    Pain: 5-6/10 maybe heat makes it feel better. Maybe ibuprofen helps    Sensation: bilateral finger tips occasionally    Prior OT Service: no    OBJECTIVE:  Precautions: none    Upper Extremity Function:  Shoulder:  WFL     Elbow:  WFL    Hand:   Pt is right hand dominant.  WNL throughout               AROM (degrees) Date:   Right Date:   Left   Wrist     Extension/flexion     Radial/ulnar deviation     Forearm     Supination/pronation  Elbow     Ext/flex     Composite flex to [facility]   (cm lack)     Index     Middle     Ring     Small     Digit Extension     Thumb  Opposition        Digit ROM Index finger Long finger Ring finger Small finger   MCP ext/flex       PIP ext/flex       DIP ext/flex       TAM total         Grip and Pinch Strength Date:03/23/23  Right     Left Date   Gross Grip Strength (lbs)  Position 2 Female -  age 55-59 R: 42-86 L: 31-76    Trial 1 40     40    Trial 2 40     40    Trial 3 45     35         Average     Pinch Strength (lbs)     Lateral Pinch     3-point Pinch     Tip Pinch               TREATMENT:      Self Care/home training (30 minutes):  Therapist issued HEP with patient demonstration (see below) with handouts provided to the patient.     Instructed in joint protection techniques, tendon gliding, adaptive devices, heat modailities.    Home Program:   Apply low to moderate heat 10 min prior to exercises for improved tissue extensibility  Initiated home training    I reviewed the no-show/attendance policy with the patient and caregiver(s). The family is aware that they must call to cancel appointments more than 24 hours in advance. They are also aware that if they late cancel or no-show three times, we reserve the right to cancel their remaining appointments. This policy is in place to allow [place] to best serve the needs of our caseload.    Treatment Rendered:  Self Care/Home Training: 30 min    Total Treatment Time: 30 mins  Total Evaluation Time: 10 mins    Patient Education:  Topics: home program, disease process  Education Provided to: patient  Education Type: education, demonstration, literature  Response to education/teachback: verbal understanding received, return demonstration    PAST MEDICAL HISTORY:  Reviewed   Past Medical History:   Diagnosis Date    Diabetes mellitus (CMS-HCC)     Essential hypertension 01/09/2017    Hypertension     Hypothyroidism     Nephrolithiasis     Psoriasis     Typically on ears and scalp and around eyelids (arthritis is dominant finding typically for patient)    Psoriatic arthritis (CMS-HCC)     Embril for 7 years prior to starting Humara; hips, shoulders, back, hands sometimes    Vitamin D deficiency        Past Surgical History: Reviewed  Past Surgical History:   Procedure Laterality Date    CESAREAN SECTION      LIPOSUCTION      PR COLONOSCOPY FLX DX W/COLLJ SPEC WHEN PFRMD N/A 08/11/2014    Procedure: COLONOSCOPY, FLEXIBLE, PROXIMAL TO SPLENIC FLEXURE; DIAGNOSTIC, W/WO COLLECTION SPECIMEN BY BRUSH OR WASH;  Surgeon: Tish Men, MD;  [facility]: [facility];  Service: Gastroenterology    PR COLONOSCOPY W/BIOPSY SINGLE/MULTIPLE N/A 05/11/2020    Procedure: COLONOSCOPY, FLEXIBLE, PROXIMAL TO SPLENIC  FLEXURE; WITH BIOPSY, SINGLE OR MULTIPLE;  Surgeon: Jarvis Morgan, MD;  Location: [facility] PROCEDURES [facility];  Service: Gastroenterology    PR UPPER GI ENDOSCOPY,BIOPSY N/A 05/11/2020    Procedure: UGI ENDOSCOPY; WITH BIOPSY, SINGLE OR MULTIPLE;  Surgeon: Jarvis Morgan, MD;  Location: [facility] PROCED[facility];  Service: Gastroenterology    TYMPANOSTOMY TUBE PLACEMENT      WISDOM TOOTH EXTRACTION      10 years ago       Allergies: Reviewed  Nitrofurantoin macrocrystal and Metformin    Medications: Reviewed    Current Outpatient Medications:     atorvastatin (LIPITOR) 20 MG tablet, Take 1 tablet (20 mg total) by mouth daily. (Patient not taking: Reported on 02/22/2023), Disp: 30 tablet, Rfl: 11    clobetasoL (TEMOVATE) 0.05 % external solution, Apply topically Two (2) times a day., Disp: 50 mL, Rfl: 2    diphenhydrAMINE 12.5 mg/5 mL Liqd 271.25 mg, hydrocortisone 2.5 MG/ML Susp 60 mg, lidocaine 2% viscous 2 % Soln 99.6 mL, nystatin 100,000 unit/mL Susp 3,000,000 Units, 5 mL by Mouth route every six (6) hours as needed. Swish, gargle, spit as needed., Disp: 120 mL, Rfl: 0    secukinumab (COSENTYX PEN, 2 PENS,) 150 mg/mL PnIj injection, Inject the contents of 2 pens (300mg  total) under the skin every 28 days, Disp: 6 mL, Rfl: 3    I attest that I have reviewed the above information.  Signed: Karie Georges, OT  03/23/2023 8:47 PM

## 2023-03-24 ENCOUNTER — Ambulatory Visit: Admit: 2023-03-24 | Discharge: 2023-03-25 | Payer: BLUE CROSS/BLUE SHIELD

## 2023-03-24 DIAGNOSIS — S46002D Unspecified injury of muscle(s) and tendon(s) of the rotator cuff of left shoulder, subsequent encounter: Principal | ICD-10-CM

## 2023-03-29 NOTE — Unmapped (Signed)
[facility] Specialty and Home Delivery Pharmacy Refill Coordination Note    Julia Avila, DOB: September 27, 1965  Phone: [phone] (home)     Confirmed Cosentyx delivery date of 12/13 via UPS (instead of 12/15) with pt via phone       All above HIPAA information was verified with patient.         03/28/2023     4:10 PM   Specialty Rx Medication Refill Questionnaire   Which Medications would you like refilled and shipped? 0   Please list all current allergies: On record   Have you missed any doses in the last 30 days? No   Have you had any changes to your medication(s) since your last refill? No   How many days remaining of each medication do you have at home? 0   If receiving an injectable medication, next injection date is 03/03/2023   Have you experienced any side effects in the last 30 days? No   Please enter the full address (street address, city, state, zip code) where you would like your medication(s) to be delivered to. [address] [place] [address]   Please specify on which day you would like your medication(s) to arrive. Note: if you need your medication(s) within 3 days, please call the pharmacy to schedule your order at [phone]  04/02/2023   Has your insurance changed since your last refill? No   Would you like a pharmacist to call you to discuss your medication(s)? No   Do you require a signature for your package? (Note: if we are billing Medicare Part B or your order contains a controlled substance, we will require a signature) No         Completed refill call assessment today to schedule patient's medication shipment from the [facility] Specialty and Home Delivery Pharmacy [phone]).  All relevant notes have been reviewed.       Confirmed patient received a Conservation officer, historic buildings and a Surveyor, mining with first shipment. The patient will receive a drug information handout for each medication shipped and additional FDA Medication Guides as required.         REFERRAL TO PHARMACIST     Referral to the pharmacist: Not needed      [facility]     Shipping address confirmed in Epic.     Delivery Scheduled: Yes, Expected medication delivery date: 03/31/23.     Medication will be delivered via UPS to the prescription address in Epic WAM.    Darryl Nestle, PharmD   [facility] Specialty and Home Delivery Pharmacy Specialty Pharmacist

## 2023-03-30 MED FILL — COSENTYX PEN 300 MG/2 PENS (150 MG/ML) SUBCUTANEOUS: SUBCUTANEOUS | 28 days supply | Qty: 2 | Fill #7

## 2023-04-03 ENCOUNTER — Institutional Professional Consult (permissible substitution): Admit: 2023-04-03 | Discharge: 2023-04-04 | Payer: BLUE CROSS/BLUE SHIELD

## 2023-04-03 NOTE — Unmapped (Signed)
[facility]  Department of Audiology    AUDIOLOGIC AND TINNITUS EVALUATION REPORT     PATIENT: Julia Avila, Julia Avila  DOB: 04/12/1966  MRN: [id number]  DOS: 04/03/2023    PLEASE SEE AUDIOGRAM INCLUDING FULL REPORT IN MEDIA MANAGER TAB.    HISTORY     [PAIN 0/10]. Julia Avila presents to [facility] for an audiologic and tinnitus evaluation.     Julia Avila is a 57 y.o. year-old female with a history of hearing loss and bilateral tinnitus. She describes her tinnitus as sounding like she is hearing inside of a 'wind tunnel'. At her evaluation appointment on 03/03/23, she described her tinnitus as a 'big problem.' Today, however, she describes her hearing loss as the primary concern and would like to discuss options for hearing better in general.     Tinnitus:   Patient reports constant medium tinnitus with onset in several years ago. Patient does not know the cause of her tinnitus. Tinnitus does not prevent her from sleeping. Occasionally when popping her ears (valsalva), chewing gum, or readjusting her jaw, she notices a slight improvement in hearing for a moment.    RESULTS     Otoscopy  RIGHT Ear: clear external auditory canal  LEFT Ear: clear external auditory canal    Hearing evaluated by Dr. 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 Patient's TFI subscale scores are below:    Screener(s) can be found scanned under Careers information officer.     TINNITUS EVALUATION     Patient described Tinnitus Characteristics:  Location: BINAURAL    Sound Quality: narrowband       Assessment: A tinnitus assessment was completed using pitch, loudness matching, minimum masking level (MML) and residual inhibition with good reliability. Results are shown below:  Pitch Matching (narrowband): Tinnitus was pitch matched at 250 Hz in the RIGHT ear and 250 Hz in the LEFT ear.  Loudness Matching  (narrowband): Tinnitus was loudness matched at 7 dB SL in the RIGHT ear and 15 dB SL in the LEFT ear.   Minimum Masking Level (MML): Patient reported tinnitus as masked (using narrowband, NBN) at 5 dB SL in the RIGHT ear and 1 dB SL in the LEFT ear.   Residual Inhibition (RI): Patient had partial (RI).   Loudness Discomfort Levels (LDLs): Patient experienced uncomfortable loudness at the following levels:   500 Hz 1000 Hz 2000 Hz 4000 Hz 8000 Hz   Right Ear 75 dB HL 75 dB HL 80 dB HL 75 dB HL 70 dB HL   Left Ear 75 dB HL 70 dB HL 70 dB HL 75 dB HL 75 dB HL   Categorization:  <70 dB HL Hyperacusis & 75-90 dB HL decreased sound tolerance     *SEE AUDIOGRAM IMAGE IN MEDIA TAB*    COUNSELING     Management options were discussed today based on audiologic results.  The findings for otoacoustic emissions measurement and pure-tone audiometry are almost always normal in patients with decreased sound tolerance disorders.    There are different kinds of loudness sensitivity:  Hyperacusis: a reaction or intolerance to the physical characteristics of most sounds  Misophonia: a distinct irritation or dislike of specific soft sounds (lip smacking, chewing)    Decreased sound tolerance may be a symptom of a variety of central nervous system disorders (some examples are: migraines, depression, multiple sclerosis) and this can be evaluated by neurologist and/or psychiatrist.   Counseled that their reaction to loud sounds is due to activation of parts of their brain that control emotional and fear responses to sound.     In response to their discomfort with loud sounds, patients with hyperacusis tend to reduce their exposure to environmental sound stimulation. Some patients even regularly use earplugs or earmuffs to ???protect??? their ears from loud sounds. Unfortunately, these well-intentioned strategies further decrease tolerance to sound. Patients are strongly encouraged to surround themselves with soft and relaxing sound, and to progressively increase their exposure to typical everyday sounds. Evidence suggests that sound therapy is effective in some patients with hyperacusis (Pienkowski, 2018; Fackrell et al., 2017)     Patient educated regarding some theoretical models of tinnitus. Julia Avila was counseled regarding realistic expectations in terms of amplification and/or tinnitus management options. Discussed amplification style and technology level best suited for patient's lifestyle. Tinnitus management options including amplification using tinnitus features, sound generators, habituation or counseling-based therapy, were reviewed with patient today. Encouraged setting levels of sound generators to allow detection of both the sound generator and tinnitus per literature recommendations.     Counseled on purpose of tinnitus therapy and/or sound generators as a tool to reduce the perception of tinnitus. Discussed habituation process of any tinnitus management option over time.Patient provided literature for Widex Zen and Phonak Tinnitus Balance and Oticon Tinnitus. Patient provided handout of tinnitus sound generator and mindfulness apps. Patient provided contact information for our financial counselor should  Julia Avila have any financial concerns or questions. Counseled patient regarding the role of the audiologist.     Hearing aid demo: Signia Pure Charge and Go 3IX, Receiver: 2S, Dome: medium open    Patient noted 'wind tunnel' tinnitus quality diminished after wearing the hearing aids for several minutes. She noted improvement in speech clarity and quality of her voice.    Patient provided price quote for hearing aid technology (See Media.) Patient provided contact for Financial Navigator to assess insurance for hearing aid benefit.    RECOMMENDATIONS      Consider integrating sound into daily environment (i.e., relaxing music, fan noise) or other stress reducing activities   Continue to monitor hearing annually    Procedure(s):  CPT 92625 - Tinnitus Assessment   HC No Charge, Qty: 4    Visit Time: 90 min     Darl Householder, AUD, AuD  Clinical Audiologist

## 2023-04-20 DIAGNOSIS — R809 Proteinuria, unspecified: Principal | ICD-10-CM

## 2023-04-20 DIAGNOSIS — E1129 Type 2 diabetes mellitus with other diabetic kidney complication: Principal | ICD-10-CM

## 2023-04-20 DIAGNOSIS — Z794 Long term (current) use of insulin: Principal | ICD-10-CM

## 2023-04-20 MED ORDER — FREESTYLE LIBRE 3 READER
Freq: Once | 0 refills | 0.00 days | Status: CP
Start: 2023-04-20 — End: 2023-04-20

## 2023-04-20 MED ORDER — FREESTYLE LIBRE 3 SENSOR DEVICE
Freq: Once | 0 refills | 0.00 days | Status: CP
Start: 2023-04-20 — End: 2023-04-20

## 2023-04-26 NOTE — Unmapped (Signed)
[facility] 's COSENTYX PEN (2 PENS) 150 mg/mL Pnij injection (secukinumab) shipment will be delayed as a result of a high copay.     I have spoken with the patient  at [phone]  and communicated the delay. We will call the patient back to reschedule the delivery upon resolution. We have not confirmed the new delivery date.

## 2023-04-26 NOTE — Unmapped (Signed)
[facility] Specialty and Home Delivery Pharmacy Refill Coordination Note    Julia Avila, DOB: 1965-08-18  Phone: [phone] (home)       All above HIPAA information was verified with patient.         04/23/2023     7:00 PM   Specialty Rx Medication Refill Questionnaire   Which Medications would you like refilled and shipped? Cosyntex   Please list all current allergies: On file   Have you missed any doses in the last 30 days? No   Have you had any changes to your medication(s) since your last refill? No   How many days remaining of each medication do you have at home? 0   If receiving an injectable medication, next injection date is 05/08/2023   Have you experienced any side effects in the last 30 days? No   Please enter the full address (street address, city, state, zip code) where you would like your medication(s) to be delivered to. [address] [place] [address]   Please specify on which day you would like your medication(s) to arrive. Note: if you need your medication(s) within 3 days, please call the pharmacy to schedule your order at [phone]  05/07/2023   Has your insurance changed since your last refill? No   Would you like a pharmacist to call you to discuss your medication(s)? No   Do you require a signature for your package? (Note: if we are billing Medicare Part B or your order contains a controlled substance, we will require a signature) No         Completed refill call assessment today to schedule patient's medication shipment from the [facility] [phone]).  All relevant notes have been reviewed.       Confirmed patient received a Conservation officer, historic buildings and a Surveyor, mining with first shipment. The patient will receive a drug information handout for each medication shipped and additional FDA Medication Guides as required.         REFERRAL TO PHARMACIST     Referral to the pharmacist: Not needed      [facility]     Shipping address confirmed in Epic.     Delivery Scheduled: Yes, Expected medication delivery date: 05/05/23.     Medication will be delivered via UPS to the prescription address in Epic WAM.    Zhane Bluitt   [facility] and Home Delivery Pharm[facility] Specialty Technician

## 2023-05-09 ENCOUNTER — Inpatient Hospital Stay: Admit: 2023-05-09 | Discharge: 2023-05-10 | Payer: BLUE CROSS/BLUE SHIELD

## 2023-05-15 DIAGNOSIS — E1129 Type 2 diabetes mellitus with other diabetic kidney complication: Principal | ICD-10-CM

## 2023-05-15 DIAGNOSIS — R809 Proteinuria, unspecified: Principal | ICD-10-CM

## 2023-05-15 DIAGNOSIS — Z794 Long term (current) use of insulin: Principal | ICD-10-CM

## 2023-05-15 MED ORDER — DEXCOM G6 RECEIVER
Freq: Every day | 0 refills | 0.00 days | Status: CP
Start: 2023-05-15 — End: ?

## 2023-05-15 MED ORDER — DEXCOM G6 TRANSMITTER DEVICE
4 refills | 0.00 days | Status: CP
Start: 2023-05-15 — End: 2023-08-13

## 2023-05-15 MED ORDER — DEXCOM G6 SENSOR DEVICE
Freq: Every day | 3 refills | 0.00 days | Status: CP
Start: 2023-05-15 — End: 2023-08-13

## 2023-05-16 ENCOUNTER — Ambulatory Visit
Admit: 2023-05-16 | Discharge: 2023-05-17 | Payer: BLUE CROSS/BLUE SHIELD | Attending: Rehabilitative and Restorative Service Providers" | Primary: Rehabilitative and Restorative Service Providers"

## 2023-05-16 NOTE — Unmapped (Signed)
[facility] ORTHOPAEDICS CLINIC NOTE      Date: 05/16/2023   Attending: Carlean Purl, PA      Primary Care Physician: Reece Leader, MD     Assessment:       ICD-10-CM   1. 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  - Home exercise program provided  - Referral to formal physical therapy/occupational therapy  - Ice affected area for 15-20 minutes every hour as needed  - Heat affected area for 20-30 minutes as needed  - Follow-up as needed  - Consider ultrasound referral if symptoms do not improve with formal physical therapy    Subjective:     Reason for Visit: Discuss imaging results      HPI:  58 y.o. female presents to discuss their imaging results.   The patient is reporting gradual improvement in her symptoms. She reports numbness and tingling into the left hand with certain overhead positions of the arm. She is noticing some improvement in range of motion when reaching behind her back, which has been the biggest pain source for her.  MRI of the left shoulder performed on 05/09/23 was independently reviewed by me and reveals a tear of the superior labrum, mild supraspinatus tendinopathy without tear, mild a.c. arthritis, and biceps tendinitis.    Objective:       Musculoskeletal   Normal ROM with posterior pain on internal rotation  Positive ROOs test for thoracic outlet syndrome  5/5 rotator cuff strength       Test Results  Imaging  None performed today.       MEDICAL DECISION MAKING (level of service defined by 2/3 elements)     Number/Complexity of Problems Addressed 1 acute, uncomplicated illness or injury (99203/99213)   Amount/Complexity of Data to be Reviewed/Analyzed Independent interpretation of a test performed by another physician/other qualified health care professional (99204/99214)   Risk of Complications/Morbidity/Mortality of Management Physical Therapy/Occupational Therapy (99203/99213)     DME     DME ORDER:  Dx:  ,

## 2023-05-16 NOTE — Unmapped (Signed)
Plan:  - Alternate ice and heat, 20 minutes each, ending with ice  - Follow up as needed  - Attend formal physical therapy beginning  as soon as possible.  - Perform Home Exercises as described below  - If you have any questions or concerns, please send [place] a MyChart Message (preferred) or call Saory Carriero's confidential clinical voicemail at [phone].     [facility] ([place])   [address]   P: [phone]  F: [phone]    [facility]  [address] [place])  [place]: [phone]  F: [phone]             *or*  [address] [place])  [place]: [phone]   F: [phone]             *or*  [address] [place])  [place]: [phone]  F: [phone]    Levada Dy on the affected side and with your opposite hand, gently press the affected arm toward the floor as shown above.  Hold this position for 3-5 seconds, then press the affected arm into the opposite hand for an isometric contraction and hold the contraction for 3-5 seconds. Relax, and press the affected arm gently back toward the floor with the opposite hand.  Repeat this cycle 5 times daily    Shoulder Towel Stretch    1. Place your affected arm behind your back (lower arm as pictured) and your opposite arm over your head with a towel draped behind your back.  2. Grab the lower part of the towel with your affected arm.  3. With the unaffected arm (the arm that is over-head), gently pull the towel up toward the ceiling so that the affected arm is passively brought up higher along your back. You should feel a gentle stretch in the back of the shoulder of the affected (lower) arm.  4. Hold this stretched position for 20-30 seconds, then relax.  5. Repeat this stretch 5 times.        Thank you for coming to [facility] and our clinic today!     We aim to provide you with the highest quality, individualized care.  If you have any unanswered questions after the visit, please do not hesitate to reach out to [place] on MyChart or leave a message for the nurse.  ?  MyChart messages: These messages can be sent to your provider and will be checked by their clinical support staff.? The messages are checked throughout the day during normal business hours from 8:30 am-4:00 pm Monday-Friday, however responses may take up to 48 hours.? Please use this method of communication for non-urgent and non-emergent concerns, questions, refill requests or inquiries only.? ?Our team will help respond to all of your questions.? Please note that you may be asked to see a provider by either a telehealth or in person visit if it is deemed your questions are best handled in the clinic setting in person.??  ?  Please keep in mind, these messages are not real time communications, so be patient when waiting for a response.    If you do not have access to MyChart, do not know how to use MyChart or have an issue that may require more extensive discussion, please call the nurses' call line: [phone].? This line is checked throughout the day and will be responded to as time allows.? Please note that  return calls could take up to 48 hours, depending on the nature of the need.?  ?  If you have an issue that requires emergent attention that cannot wait; either call the Orthopaedics resident on call at [phone], consider coming to our [facility] walk-in clinic, or go to the nearest Emergency Department.    If you need to schedule future appointments, please call [phone].     We look forward to seeing you again in the future and appreciate you choosing [facility] for your care!    Thank you,                We provide innovative and comprehensive patient centered care that is supported by evidence-based research                                                                                                    RESEARCH PARTICIPATION    Please check out our current research studies to see if you or someone you know may qualify at:    [email or web address]

## 2023-05-19 DIAGNOSIS — L4050 Arthropathic psoriasis, unspecified: Principal | ICD-10-CM

## 2023-05-19 MED ORDER — COSENTYX PEN 300 MG/2 PENS (150 MG/ML) SUBCUTANEOUS
SUBCUTANEOUS | 3 refills | 84.00 days | Status: CP
Start: 2023-05-19 — End: ?
  Filled 2023-06-13: qty 2, 28d supply, fill #0

## 2023-05-19 NOTE — Unmapped (Signed)
Cosentyx refill  Last Visit Date: 02/22/2023  Next Visit Date: 07/27/2023    Lab Results   Component Value Date    ALT 48 10/13/2022    AST 22 10/13/2022    ALBUMIN 4.1 04/19/2022    CREATININE 0.44 (L) 12/23/2022     Lab Results   Component Value Date    WBC 5.2 10/13/2022    HGB 14.2 10/13/2022    HCT 42.4 10/13/2022    PLT 182 10/13/2022     Lab Results   Component Value Date    NEUTROPCT 54.0 10/13/2022    LYMPHOPCT 35.9 10/13/2022    MONOPCT 7.8 10/13/2022    EOSPCT 1.5 10/13/2022    BASOPCT 0.8 10/13/2022

## 2023-05-30 DIAGNOSIS — R809 Proteinuria, unspecified: Principal | ICD-10-CM

## 2023-05-30 DIAGNOSIS — E1129 Type 2 diabetes mellitus with other diabetic kidney complication: Principal | ICD-10-CM

## 2023-05-30 DIAGNOSIS — Z794 Long term (current) use of insulin: Principal | ICD-10-CM

## 2023-06-07 NOTE — Unmapped (Signed)
 [facility] has made a second and final attempt to reach this patient to refill the following medication:COSENTYX PEN (2 PENS) 150 mg/mL Pnij injection (secukinumab).      We have left voicemails on the following phone numbers: [phone], have sent a MyChart message, have sent a text message to the following phone numbers: [phone], and have sent a Mychart questionnaire..    Dates contacted: 2/13 - 2/19   Last scheduled delivery: 03/30/23    The patient may be at risk of non-compliance with this medication. The patient should call the [facility] at [phone]  Option 4, then Option 2: Dermatology, Gastroenterology, Rheumatology to refill medication.    Art therapist

## 2023-06-09 NOTE — Unmapped (Signed)
 [facility] Specialty and Home Delivery Pharmacy Refill Coordination Note    Specialty Medication(s) to be Shipped:   Inflammatory Disorders: Cosentyx    Other medication(s) to be shipped: No additional medications requested for fill at this time     Julia Avila, DOB: [birth year]  Phone: [phone] (home)       All above HIPAA information was verified with patient.     Was a Nurse, learning disability used for this call? No    Completed refill call assessment today to schedule patient's medication shipment from the [facility]  [phone]).  All relevant notes have been reviewed.     Specialty medication(s) and dose(s) confirmed: Regimen is correct and unchanged.   Changes to medications: [name] reports no changes at this time.  Changes to insurance: No  New side effects reported not previously addressed with a pharmacist or physician: None reported  Questions for the pharmacist: No    Confirmed patient received a Conservation officer, historic buildings and a Surveyor, mining with first shipment. The patient will receive a drug information handout for each medication shipped and additional FDA Medication Guides as required.       DISEASE/MEDICATION-SPECIFIC INFORMATION        For patients on injectable medications: Patient currently has 0 doses left.  Next injection is scheduled for overdue will take on 2/26.    SPECIALTY MEDICATION ADHERENCE     Medication Adherence    Patient reported X missed doses in the last month: 0  Specialty Medication: COSENTYX PEN (2 PENS) 150 mg/mL Pnij injection (secukinumab)  Patient is on additional specialty medications: No              Were doses missed due to medication being on hold? No    Cosentyx 150 mg/ml: 0 doses of medicine on hand        REFERRAL TO PHARMACIST     Referral to the pharmacist: Not needed      [facility]     Shipping address confirmed in Epic.       Delivery Scheduled: Yes, Expected medication delivery date: 06/14/23.     Medication will be delivered via UPS to the prescription address in Epic WAM.    Willette Pa   [facility] Special[facility] Home Delivery Pharm[facility]  Specialty Technician

## 2023-07-10 NOTE — Unmapped (Signed)
 [facility] Specialty and Home Delivery Pharmacy Clinical Assessment & Refill Coordination Note    Julia Avila, DOB: 22-Feb-1966  Phone: [phone] (home)     All above HIPAA information was verified with patient.     Was a Nurse, learning disability used for this call? No    Specialty Medication(s):   Inflammatory Disorders: Cosentyx     Current Outpatient Medications   Medication Sig Dispense Refill    atorvastatin (LIPITOR) 20 MG tablet Take 1 tablet (20 mg total) by mouth daily. 30 tablet 11    blood-glucose meter,continuous (DEXCOM G6 RECEIVER) Misc 1 each by Miscellaneous route in the morning. Dispense 1 receiver annually.    Sent to [facility]. 1 each 0    blood-glucose sensor (DEXCOM G6 SENSOR) Devi 1 each by Miscellaneous route in the morning. [facility]. 9 each 3    blood-glucose transmitter (DEXCOM G6 TRANSMITTER) Devi 1 each by Miscellaneous route Every three (3) months. [facility] pharmacy 3 each 4    clobetasoL (TEMOVATE) 0.05 % external solution Apply topically Two (2) times a day. 50 mL 2    diphenhydrAMINE 12.5 mg/5 mL Liqd 271.25 mg, hydrocortisone 2.5 MG/ML Susp 60 mg, lidocaine 2% viscous 2 % Soln 99.6 mL, nystatin 100,000 unit/mL Susp 3,000,000 Units 5 mL by Mouth route every six (6) hours as needed. Swish, gargle, spit as needed. 120 mL 0    secukinumab (COSENTYX PEN, 2 PENS,) 150 mg/mL PnIj injection Inject the contents of 2 pens (300mg  total) under the skin every 28 days 6 mL 3     No current facility-administered medications for this visit.        Changes to medications: Charma reports no changes at this time.    Medication list has been reviewed and updated in Epic: Yes    Allergies   Allergen Reactions    Nitrofurantoin Macrocrystal Rash     Large red welts all over her body    Metformin Diarrhea       Changes to allergies: No    Allergies have been reviewed and updated in Epic: Yes    SPECIALTY MEDICATION ADHERENCE              Specialty medication(s) dose(s) confirmed: Regimen is correct and unchanged.     Are there any concerns with adherence? No    Adherence counseling provided? Not needed    CLINICAL MANAGEMENT AND INTERVENTION      Clinical Benefit Assessment:    Do you feel the medicine is effective or helping your condition? Yes.  Before biologics, she has severe pain and has trouble getting out of a chair.      Clinical Benefit counseling provided?  Patient has OA as well now so has some pain that is not resolved by Cosentyx.     Adverse Effects Assessment:    Are you experiencing any side effects? No    Are you experiencing difficulty administering your medicine? No    Quality of Life Assessment:    Quality of Life    Rheumatology  Oncology  Dermatology  Cystic Fibrosis          How many days over the past month did your PsA  keep you from your normal activities? For example, brushing your teeth or getting up in the morning. Mild discomfort in hands/ankles and joints.  But also has OA     Have you discussed this with your provider? Not needed    Acute Infection Status:    Acute infections noted within  Epic:  No active infections    Patient reported infection: None    Therapy Appropriateness:    Is therapy appropriate based on current medication list, adverse reactions, adherence, clinical benefit and progress toward achieving therapeutic goals? Yes, therapy is appropriate and should be continued     Clinical Intervention:    Was an intervention completed as part of this clinical assessment? No    DISEASE/MEDICATION-SPECIFIC INFORMATION      For patients on injectable medications: Patient currently has 0 doses left.  Next injection is scheduled for 3/29.    Chronic Inflammatory Diseases: Have you experienced any flares in the last month? No    PATIENT SPECIFIC NEEDS     Does the patient have any physical, cognitive, or cultural barriers? No    Is the patient high risk? No    Does the patient require physician intervention or other additional services (i.e., nutrition, smoking cessation, social work)? No    Does the patient have an additional or emergency contact listed in their chart? Yes    SOCIAL DETERMINANTS OF HEALTH     At the [facility], we have learned that life circumstances - like trouble affording food, housing, utilities, or transportation can affect the health of many of our patients.   That is why we wanted to ask: are you currently experiencing any life circumstances that are negatively impacting your health and/or quality of life? No    Social Drivers of Health     Food Insecurity: No Food Insecurity (12/11/2020)    Hunger Vital Sign     Worried About Running Out of Food in the Last Year: Never true     Ran Out of Food in the Last Year: Never true   Tobacco Use: Low Risk  (02/22/2023)    Patient History     Smoking Tobacco Use: Never     Smokeless Tobacco Use: Never     Passive Exposure: Not on file   Transportation Needs: No Transportation Needs (12/11/2020)    PRAPARE - Transportation     Lack of Transportation (Medical): No     Lack of Transportation (Non-Medical): No   Alcohol Use: Not At Risk (10/30/2020)    Alcohol Use     How often do you have a drink containing alcohol?: Never     How many drinks containing alcohol do you have on a typical day when you are drinking?: 1 - 2     How often do you have 5 or more drinks on one occasion?: Never   Housing: Not on file   Physical Activity: Inactive (10/07/2019)    Exercise Vital Sign     Days of Exercise per Week: 0 days     Minutes of Exercise per Session: 0 min   Utilities: Not on file   Stress: Not on file   Interpersonal Safety: Not on file   Substance Use: Not on file (02/22/2023)   Intimate Partner Violence: Not At Risk (12/11/2020)    Humiliation, Afraid, Rape, and Kick questionnaire     Fear of Current or Ex-Partner: No     Emotionally Abused: No     Physically Abused: No     Sexually Abused: No   Social Connections: Not on file   Financial Resource Strain: Low Risk  (12/11/2020)    Overall Financial Resource Strain (CARDIA)     Difficulty of Paying Living Expenses: Not very hard   Depression: Not at risk (12/11/2020)    PHQ-2  PHQ-2 Score: 2   Internet Connectivity: Not on file   Health Literacy: Not on file       Would you be willing to receive help with any of the needs that you have identified today? Not applicable       SHIPPING     Specialty Medication(s) to be Shipped:   Inflammatory Disorders: Cosentyx    Other medication(s) to be shipped: No additional medications requested for fill at this time     Changes to insurance: No    Cost and Payment: Patient has a $0 copay, payment information is not required.    Delivery Scheduled: Yes, Expected medication delivery date: 3/27.     Medication will be delivered via UPS to the confirmed prescription address in [facility].    The patient will receive a drug information handout for each medication shipped and additional FDA Medication Guides as required.  Verified that patient has previously received a Conservation officer, historic buildings and a Surveyor, mining.    The patient or caregiver noted above participated in the development of this care plan and knows that they can request review of or adjustments to the care plan at any time.      All of the patient's questions and concerns have been addressed.    Julianne Rice, PharmD   [facility] Delivery [facility] Specialty Pharmacist

## 2023-07-12 MED FILL — COSENTYX PEN 300 MG/2 PENS (150 MG/ML) SUBCUTANEOUS: SUBCUTANEOUS | 28 days supply | Qty: 2 | Fill #1

## 2023-07-15 ENCOUNTER — Other Ambulatory Visit: Payer: Self-pay

## 2023-07-15 ENCOUNTER — Emergency Department (HOSPITAL_COMMUNITY)
Admission: EM | Admit: 2023-07-15 | Discharge: 2023-07-15 | Disposition: A | Discharge status: Home / Self Care | Attending: Emergency Medicine | Admitting: Emergency Medicine

## 2023-07-15 ENCOUNTER — Encounter (HOSPITAL_COMMUNITY): Payer: Self-pay | Admitting: *Deleted

## 2023-07-15 ENCOUNTER — Emergency Department (HOSPITAL_COMMUNITY)

## 2023-07-15 DIAGNOSIS — R6884 Jaw pain: Secondary | ICD-10-CM | POA: Diagnosis not present

## 2023-07-15 DIAGNOSIS — E119 Type 2 diabetes mellitus without complications: Secondary | ICD-10-CM | POA: Insufficient documentation

## 2023-07-15 DIAGNOSIS — I714 Abdominal aortic aneurysm, without rupture, unspecified: Secondary | ICD-10-CM | POA: Diagnosis not present

## 2023-07-15 DIAGNOSIS — I10 Essential (primary) hypertension: Secondary | ICD-10-CM | POA: Insufficient documentation

## 2023-07-15 DIAGNOSIS — R202 Paresthesia of skin: Secondary | ICD-10-CM | POA: Insufficient documentation

## 2023-07-15 DIAGNOSIS — Z79899 Other long term (current) drug therapy: Secondary | ICD-10-CM | POA: Insufficient documentation

## 2023-07-15 DIAGNOSIS — R079 Chest pain, unspecified: Secondary | ICD-10-CM | POA: Diagnosis present

## 2023-07-15 HISTORY — DX: Unspecified osteoarthritis, unspecified site: M19.90

## 2023-07-15 LAB — COMPREHENSIVE METABOLIC PANEL WITH GFR
ALT: 41 U/L (ref 0–44)
AST: 21 U/L (ref 15–41)
Albumin: 4.3 g/dL (ref 3.5–5.0)
Alkaline Phosphatase: 88 U/L (ref 38–126)
Anion gap: 12 (ref 5–15)
BUN: 18 mg/dL (ref 6–20)
CO2: 23 mmol/L (ref 22–32)
Calcium: 9.5 mg/dL (ref 8.9–10.3)
Chloride: 100 mmol/L (ref 98–111)
Creatinine, Ser: 0.71 mg/dL (ref 0.44–1.00)
GFR, Estimated: >60 mL/min (ref >60)
Glucose, Bld: 198 mg/dL — ABNORMAL HIGH (ref 70–99)
Potassium: 3.7 mmol/L (ref 3.5–5.1)
Sodium: 135 mmol/L (ref 135–145)
Total Bilirubin: 0.6 mg/dL (ref 0.0–1.2)
Total Protein: 8 g/dL (ref 6.5–8.1)

## 2023-07-15 LAB — CBC WITH DIFFERENTIAL/PLATELET
Abs Immature Granulocytes: 0.02 10*3/uL (ref 0.00–0.07)
Basophils Absolute: 0 10*3/uL (ref 0.0–0.1)
Basophils Relative: 1 %
Eosinophils Absolute: 0.1 10*3/uL (ref 0.0–0.5)
Eosinophils Relative: 2 %
HCT: 46.1 % — ABNORMAL HIGH (ref 36.0–46.0)
Hemoglobin: 15.4 g/dL — ABNORMAL HIGH (ref 12.0–15.0)
Immature Granulocytes: 0 %
Lymphocytes Relative: 35 %
Lymphs Abs: 2.4 10*3/uL (ref 0.7–4.0)
MCH: 27.5 pg (ref 26.0–34.0)
MCHC: 33.4 g/dL (ref 30.0–36.0)
MCV: 82.5 fL (ref 80.0–100.0)
Monocytes Absolute: 0.4 10*3/uL (ref 0.1–1.0)
Monocytes Relative: 6 %
Neutro Abs: 3.9 10*3/uL (ref 1.7–7.7)
Neutrophils Relative %: 56 %
Platelets: 221 10*3/uL (ref 150–400)
RBC: 5.59 MIL/uL — ABNORMAL HIGH (ref 3.87–5.11)
RDW: 13 % (ref 11.5–15.5)
WBC: 6.8 10*3/uL (ref 4.0–10.5)
nRBC: 0 % (ref 0.0–0.2)

## 2023-07-15 LAB — TROPONIN I (HIGH SENSITIVITY)
Troponin I (High Sensitivity): 3 ng/L (ref <18)
Troponin I (High Sensitivity): 3 ng/L (ref <18)

## 2023-07-15 MED ORDER — AMLODIPINE BESYLATE 5 MG PO TABS: 5.0000 mg | ORAL_TABLET | Freq: Every day | ORAL | 1 refills | Status: AC

## 2023-07-15 MED ORDER — IOHEXOL 350 MG/ML SOLN
100.0000 mL | Freq: Once | INTRAVENOUS | Status: AC | PRN
  Administered 2023-07-15: 100 mL via INTRAVENOUS

## 2023-07-15 MED ORDER — LORAZEPAM 2 MG/ML IJ SOLN
0.5000 mg | Freq: Once | INTRAMUSCULAR | Status: AC
Start: 2023-07-15 — End: 2023-07-15
  Administered 2023-07-15: 0.5 mg via INTRAVENOUS
  Filled 2023-07-15: qty 1

## 2023-07-15 MED ORDER — LABETALOL HCL 5 MG/ML IV SOLN
20.0000 mg | Freq: Once | INTRAVENOUS | Status: AC
  Administered 2023-07-15: 20 mg via INTRAVENOUS
  Filled 2023-07-15: qty 4

## 2023-07-15 NOTE — ED Notes (Signed)
 Pt returned from CT

## 2023-07-15 NOTE — ED Notes (Signed)
 Patient transported to CT

## 2023-07-15 NOTE — Discharge Instructions (Signed)
 Take 1 baby aspirin a day and follow-up with your family doctor this week for recheck

## 2023-07-15 NOTE — ED Notes (Signed)
 Pt stated, "the buzzing and numbness keeps coming and going. They were longer episodes before." Pt is sitting in bed a little nervous. Stated she hasn't been taking her medications as she should.   Denies pain at this time.

## 2023-07-15 NOTE — ED Triage Notes (Addendum)
 Pt had some numbness on left side inc face, left arm/hand and tingling to left leg.  Pt states she had some "buzzing" to left ear.  Pt states started about 30 minutes and lasted 2-3 minutes, denies any numbness at present. Denies any facial droop or slurred speech.

## 2023-07-15 NOTE — ED Provider Notes (Signed)
 [place] EMERGENCY DEPARTMENT AT [facility] Provider Note   CSN: [id number] Arrival date & time: 07/15/23  1637     History {Add pertinent medical, surgical, social history, OB history to HPI:1} Chief Complaint  Patient presents with   Numbness    Krystal Nelson is a 58 y.o. female.  Patient complains of left-sided numbness to her face left arm left leg and some pain in her left jaw.  Patient has a history of diabetes   Chest Pain      Home Medications Prior to Admission medications   Medication Sig Start Date End Date Taking? Authorizing Provider  amLODipine (NORVASC) 5 MG tablet Take 1 tablet (5 mg total) by mouth daily. 07/15/23  Yes Bethann Berkshire, MD  Fluocinolone Acetonide 0.01 % OIL Apply 1 application  topically daily as needed (for psoriasis). 02/25/19  Yes [provider]  ibuprofen (ADVIL) 200 MG tablet Take 200-600 mg by mouth every 6 (six) hours as needed for pain.   Yes [provider]  Secukinumab (COSENTYX SENSOREADY PEN) 150 MG/ML SOAJ Inject 150 mg into the skin every 28 (twenty-eight) days. 06/20/18  Yes [provider]      Allergies    Macrobid [nitrofurantoin macrocrystal] and Metformin    Review of Systems   Review of Systems  Cardiovascular:  Positive for chest pain.    Physical Exam Updated Vital Signs BP (!) 142/78   Pulse 72   Temp 98 F (36.7 C) (Oral)   Resp 16   Ht 5\' 3"  (1.6 m)   Wt 81.6 kg   SpO2 92%   BMI 31.87 kg/m  Physical Exam  ED Results / Procedures / Treatments   Labs (all labs ordered are listed, but only abnormal results are displayed) Labs Reviewed  CBC WITH DIFFERENTIAL/PLATELET - Abnormal; Notable for the following components:      Result Value   RBC 5.59 (*)    Hemoglobin 15.4 (*)    HCT 46.1 (*)    All other components within normal limits  COMPREHENSIVE METABOLIC PANEL WITH GFR - Abnormal; Notable for the following components:   Glucose, Bld 198 (*)    All other  components within normal limits  TROPONIN I (HIGH SENSITIVITY)  TROPONIN I (HIGH SENSITIVITY)    EKG None  Radiology CT Angio Chest/Abd/Pel for Dissection W and/or Wo Contrast Result Date: 07/15/2023 CLINICAL DATA:  Acute aortic syndrome (AAS) suspected. Left side numbness and tingling. EXAM: CT ANGIOGRAPHY CHEST, ABDOMEN AND PELVIS TECHNIQUE: Non-contrast CT of the chest was initially obtained. Multidetector CT imaging through the chest, abdomen and pelvis was performed using the standard protocol during bolus administration of intravenous contrast. Multiplanar reconstructed images and MIPs were obtained and reviewed to evaluate the vascular anatomy. RADIATION DOSE REDUCTION: This exam was performed according to the departmental dose-optimization program which includes automated exposure control, adjustment of the mA and/or kV according to patient size and/or use of iterative reconstruction technique. CONTRAST:  OMNIPAQUE IOHEXOL 350 MG/ML SOLN COMPARISON:  12/28/2013 FINDINGS: CTA CHEST FINDINGS Cardiovascular: Heart is normal size. Aorta is normal caliber. No aortic dissection. No filling defects in the pulmonary arteries to suggest pulmonary emboli. Mediastinum/Nodes: No mediastinal, hilar, or axillary adenopathy. Trachea and esophagus are unremarkable. Thyroid unremarkable. Lungs/Pleura: No confluent opacities or effusions. Musculoskeletal: Chest wall soft tissues are unremarkable. No acute bony abnormality. Review of the MIP images confirms the above findings. CTA ABDOMEN AND PELVIS FINDINGS VASCULAR Aorta: Normal caliber aorta without aneurysm, dissection, vasculitis or  significant stenosis. Scattered calcifications. Celiac: Patent without evidence of aneurysm, dissection, vasculitis or significant stenosis. SMA: Patent without evidence of aneurysm, dissection, vasculitis or significant stenosis. Renals: Both renal arteries are patent without evidence of aneurysm, dissection, vasculitis,  fibromuscular dysplasia or significant stenosis. IMA: Patent without evidence of aneurysm, dissection, vasculitis or significant stenosis. Inflow: Patent without evidence of aneurysm, dissection, vasculitis or significant stenosis. Veins: No obvious venous abnormality within the limitations of this arterial phase study. Review of the MIP images confirms the above findings. NON-VASCULAR Hepatobiliary: No focal hepatic abnormality. Gallbladder unremarkable. Pancreas: No focal abnormality or ductal dilatation. Spleen: No focal abnormality.  Normal size. Adrenals/Urinary Tract: No adrenal abnormality. No focal renal abnormality. No stones or hydronephrosis. Urinary bladder is unremarkable. Stomach/Bowel: Normal appendix. Stomach, large and small bowel grossly unremarkable. Lymphatic: No adenopathy Reproductive: Uterus and adnexa unremarkable.  No mass. Other: No free fluid or free air. Musculoskeletal: No acute bony abnormality. Review of the MIP images confirms the above findings. IMPRESSION: No evidence of aortic aneurysm or dissection. No evidence of pulmonary embolus. No acute findings in the chest, abdomen or pelvis. Electronically Signed   By: Charlett Nose M.D.   On: 07/15/2023 19:15   MR BRAIN WO CONTRAST Result Date: 07/15/2023 CLINICAL DATA:  Neuro deficit, acute, stroke suspected. Numbness/tingling involving the left face and upper and lower extremities. EXAM: MRI HEAD WITHOUT CONTRAST TECHNIQUE: Multiplanar, multiecho pulse sequences of the brain and surrounding structures were obtained without intravenous contrast. COMPARISON:  None Available. FINDINGS: Brain: There is no evidence of an acute infarct, intracranial hemorrhage, mass, midline shift, or extra-axial fluid collection. Cerebral volume is normal. The ventricles are normal in size. Small T2 hyperintensities in the cerebral white matter are nonspecific but compatible with minimal chronic small vessel ischemic disease. Vascular: Major intracranial  vascular flow voids are preserved. Skull and upper cervical spine: Unremarkable bone marrow signal. Sinuses/Orbits: Unremarkable orbits. Subcentimeter mucous retention cyst in the right maxillary sinus. Trace right mastoid air cell fluid or mucosal thickening. Other: None. IMPRESSION: 1. No acute intracranial abnormality. 2. Minimal chronic small vessel ischemic disease. Electronically Signed   By: Sebastian Ache M.D.   On: 07/15/2023 18:47   DG Chest Port 1 View Result Date: 07/15/2023 CLINICAL DATA:  Left arm numbness. EXAM: PORTABLE CHEST 1 VIEW COMPARISON:  December 25, 2012. FINDINGS: The heart size and mediastinal contours are within normal limits. Both lungs are clear. The visualized skeletal structures are unremarkable. IMPRESSION: No active disease. Electronically Signed   By: Lupita Raider M.D.   On: 07/15/2023 17:21    Procedures Procedures  {Document cardiac monitor, telemetry assessment procedure when appropriate:1}  Medications Ordered in ED Medications  labetalol (NORMODYNE) injection 20 mg (20 mg Intravenous Given 07/15/23 1717)  LORazepam (ATIVAN) injection 0.5 mg (0.5 mg Intravenous Given 07/15/23 1717)  iohexol (OMNIPAQUE) 350 MG/ML injection 100 mL (100 mLs Intravenous Contrast Given 07/15/23 1849)    ED Course/ Medical Decision Making/ A&P   {   Click here for ABCD2, HEART and other calculatorsREFRESH Note before signing :1}                              Medical Decision Making Amount and/or Complexity of Data Reviewed Labs: ordered. Radiology: ordered. ECG/medicine tests: ordered.  Risk Prescription drug management.   Patient with poorly controlled hypertension.  She started on Norvasc and will start a baby aspirin a day and follow-up with her PCP  {  Document critical care time when appropriate:1} {Document review of labs and clinical decision tools ie heart score, Chads2Vasc2 etc:1}  {Document your independent review of radiology images, and any outside  records:1} {Document your discussion with family members, caretakers, and with consultants:1} {Document social determinants of health affecting pt's care:1} {Document your decision making why or why not admission, treatments were needed:1} Final Clinical Impression(s) / ED Diagnoses Final diagnoses:  Paresthesia  Primary hypertension    Rx / DC Orders ED Discharge Orders          Ordered    amLODipine (NORVASC) 5 MG tablet  Daily        07/15/23 2005

## 2023-07-18 MED ORDER — DEXCOM G6 SENSOR DEVICE
Freq: Every day | 3 refills | 0 days | Status: CP
Start: 2023-07-18 — End: 2023-10-16

## 2023-07-18 MED ORDER — DEXCOM G6 RECEIVER
Freq: Every day | 0 refills | 0 days | Status: CP
Start: 2023-07-18 — End: 2023-07-18

## 2023-07-18 MED ORDER — DEXCOM G6 TRANSMITTER DEVICE
4 refills | 0 days | Status: CP
Start: 2023-07-18 — End: 2023-10-16

## 2023-07-18 NOTE — Unmapped (Signed)
 The admin team has completed this request. The patient has been reached and is scheduled for:  - VISIT TYPE: return general   - DATE: 07/19/23  - TIME: 9:20 pm  - PROVIDER: Nedra Hai  - ADDITIONAL NOTES: ER f/u per triage

## 2023-07-18 NOTE — Unmapped (Signed)
 Pharmacy change for dexcom.  Transcribed and sent to [facility] in [place] as requested.

## 2023-07-18 NOTE — Unmapped (Signed)
 Addended by: Joesphine Bare on: 07/18/2023 01:23 PM     Modules accepted: Orders

## 2023-07-19 ENCOUNTER — Ambulatory Visit
Admit: 2023-07-19 | Discharge: 2023-07-20 | Attending: Student in an Organized Health Care Education/Training Program | Primary: Student in an Organized Health Care Education/Training Program

## 2023-07-19 DIAGNOSIS — Z794 Long term (current) use of insulin: Principal | ICD-10-CM

## 2023-07-19 DIAGNOSIS — R200 Anesthesia of skin: Principal | ICD-10-CM

## 2023-07-19 DIAGNOSIS — E1129 Type 2 diabetes mellitus with other diabetic kidney complication: Principal | ICD-10-CM

## 2023-07-19 DIAGNOSIS — G500 Trigeminal neuralgia: Principal | ICD-10-CM

## 2023-07-19 DIAGNOSIS — R809 Proteinuria, unspecified: Principal | ICD-10-CM

## 2023-07-19 MED ORDER — DEXCOM G6 RECEIVER
Freq: Every day | 0 refills | 0 days | Status: CP
Start: 2023-07-19 — End: ?

## 2023-07-19 MED ORDER — GABAPENTIN 300 MG CAPSULE
ORAL_CAPSULE | Freq: Three times a day (TID) | ORAL | 3 refills | 90 days | Status: CP
Start: 2023-07-19 — End: 2024-07-18

## 2023-07-19 MED ORDER — METFORMIN ER 500 MG TABLET,EXTENDED RELEASE 24 HR
ORAL_TABLET | Freq: Every day | ORAL | 11 refills | 30 days | Status: CP
Start: 2023-07-19 — End: 2024-07-18

## 2023-07-19 MED ORDER — DEXCOM G6 TRANSMITTER DEVICE
4 refills | 0 days | Status: CP
Start: 2023-07-19 — End: 2023-10-17

## 2023-07-19 MED ORDER — DEXCOM G6 SENSOR DEVICE
Freq: Every day | 3 refills | 0 days | Status: CP
Start: 2023-07-19 — End: 2023-10-17

## 2023-07-19 NOTE — Unmapped (Addendum)
 It was nice to see you in clinic today! Below is a summary of what we discussed:     A1c 8.5 today. We will start low dose metformin extended release to see if you have less intestinal side effects. We can consider other medications in the future.  Your facial numbness and hand numbness could be 2 separate issues (trigeminal neuralgia is face and diabetic neuropathy in hand) or both could be related to your diabetes. We will start gabapentin (start by taking in evening for a few nights and if it doesn't make you too sleepy, can take 3x/day as written). Please let us know if it helps your symptoms.  I have placed a referral for you to get a brain MRI/MRA to evaluate for trigeminal neuralgia. They will call you to schedule. If you do not hear from them in the next week please call [phone] to schedule.  I resent the glucose monitor to your pharmacy, our staff will continue to work on getting this set up.  Follow up: Return in about 6 weeks (around 08/30/2023) for Recheck.     General Clinic Information:  - Our clinic is open Monday through Friday 8AM-5PM. Please call [phone] (or toll free [phone]) or send a MyChart message to request an appointment. Our fax number is [phone].  - Our Same Day Clinic functions as an urgent care that can see you for same day appointments if you primary provider is not available. Please call [phone] (or toll free [phone]) and ask for an appointment in the Same Day Clinic  - We have a full service lab, imaging services, and outpatient pharmacy on the first floor of [facility].   - If you need medical attention after 5PM during the week or it's the weekend, call the [facility] 24/7 Nursing Line [phone] to get nurse advice.

## 2023-07-19 NOTE — Unmapped (Signed)
 [place] [facility] at [facility]     Reason for visit: Hospital Follow up    Questions / Concerns that need to be addressed:         Omron BPs (complete if screening BP has a systolic  > 130 or diastolic > 80)  BP#1 153/93   BP#2 161/90  BP#3 159/89    Average BP 158/91  (please note this as a comment in vitals)     PTHomeBP           Dexcom or Libre CGM in use? If so, pull appropriate reporting through portal (Dexcom) or EPIC order [name]).    HCDM reviewed and updated in Epic:    We are working to make sure all of our patients??? wishes are updated in Epic and part of that is documenting a Environmental health practitioner for each patient  A Health Care Decision Maker is someone you choose who can make health care decisions for you if you are not able - who would you most want to do this for you????  is already up to date.    HCDM (patient stated preference): Julia Avila - Mother - [phone]    BPAs completed:  PHQ9, Diabetes - Foot Exam, and Diabetes - POC A1C    Annual Screenings:   SDOH , Tobacco, Audit/Alcohol , Substance Use, Domestic Abuse, and Health Literacy  __________________________________________________________________________________________    SCREENINGS COMPLETED IN FLOWSHEETS      AUDIT  AUDIT - C Score (Part 1): 0    PHQ2       PHQ9          GAD7       COPD Assessment       Falls Risk

## 2023-07-20 DIAGNOSIS — E1129 Type 2 diabetes mellitus with other diabetic kidney complication: Principal | ICD-10-CM

## 2023-07-20 DIAGNOSIS — Z794 Long term (current) use of insulin: Principal | ICD-10-CM

## 2023-07-20 DIAGNOSIS — R809 Proteinuria, unspecified: Principal | ICD-10-CM

## 2023-07-20 MED ORDER — DEXCOM G6 TRANSMITTER DEVICE
4 refills | 0 days | Status: CP
Start: 2023-07-20 — End: 2023-10-18

## 2023-07-20 MED ORDER — DEXCOM G6 RECEIVER
Freq: Every day | 0 refills | 0 days | Status: CP
Start: 2023-07-20 — End: 2023-07-20

## 2023-07-20 MED ORDER — DEXCOM G6 SENSOR DEVICE
Freq: Every day | 3 refills | 0 days | Status: CP
Start: 2023-07-20 — End: 2023-10-18

## 2023-07-21 MED ORDER — BLOOD-GLUCOSE METER KIT WRAPPER
11 refills | 0 days | Status: CP
Start: 2023-07-21 — End: 2024-07-20

## 2023-07-21 MED ORDER — BLOOD GLUCOSE TEST STRIPS
ORAL_STRIP | 11 refills | 0 days | Status: CP
Start: 2023-07-21 — End: 2024-07-20

## 2023-07-21 MED ORDER — LANCETS
11 refills | 0 days | Status: CP
Start: 2023-07-21 — End: 2024-07-20

## 2023-07-21 NOTE — Unmapped (Signed)
 Internal Medicine Clinic Visit    Reason for visit: ED follow-up    A/P:         1. Trigeminal neuralgia of left side of face    2. Left facial numbness    3. Type 2 diabetes mellitus with microalbuminuria, with long-term current use of insulin      T2DM  A1c 8.5 today, up from 7.0 approximately 4 months ago.  Has not been taking metformin due to GI side effects.  Patient is interested in obtaining CGM, unlikely to be covered as she is not on insulin but will send to pharmacy.  Will also send glucose meter kit with supplies to pharmacy in case this is not approved.  We discussed various treatment options, will trial extended release metformin at lowest dose (500 mg/day) to see if she tolerates this better.  She may be a candidate for GLP-1 agonist as well.  - Start metformin XR 500 mg daily  - Glucose meter kit plus supplies sent to pharmacy  - CGM ordered per patient request  - Retinal eye exam next visit  - Patient not taking statin as ordered, address in subsequent follow-up visits    C/f trigeminal neuralgia  Patient with acute onset left-sided facial pain several days ago, particular around the left side of her mouth. Has nearly continuous dull pain with brief (approximately 30 second) episodes of sharper pain.  Also with intermittent numbness and tingling of her left hand, but not of her right hand or toes.  No headaches, vision changes, pain around her temples, or neck stiffness.  Unclear etiology.  MRI brain without contrast reassuring for acute stroke, considering trigeminal neuralgia versus less likely diabetic neuropathy versus complex migraine.  Will plan to evaluate for trigeminal neuralgia with brain MRI/MRA.  Also discussed treatment options (carbamazepine versus gabapentin) and patient electing for gabapentin. Unclear etiology of left hand numbness/tingling, would expect diabetic neuropathy to be bilateral and the affected area does not fit a classical pattern of distribution.  Will monitor effect of gabapentin on this symptom.  - Brain MRI with/without contrast and MRA  - Gabapentin 300 mg nightly, increased to 300 mg 3 times daily if no side effects  - Return precautions given    HTN  Patient hypertensive today in clinic, 158/91.  Was started on amlodipine in the ED and has only taken for 1 or 2 days.  Likely too early to see effect from this so we will defer any changes today.  Patient would likely benefit from ACE/ARB as opposed to calcium channel blocker in the long-term, but in the setting of other medication changes and patient's preference to minimize medications, will defer changes today.  - Continue amlodipine 5 mg daily  - Anticipate transition to ACE/ARB in the future, deferred today    Return in about 6 weeks (around 08/30/2023) for Recheck.    __________________________________________________________    HPI:    Julia Avila is a 58 year old female with past medical history T2DM, HTN, HLD, psoriatic arthritis who presents today for follow-up.    Was seen in the ED a couple of days ago for acute onset left face and left arm numbness and tingling.  She was worked up for a stroke, and brain MRI without contrast was reassuring and demonstrated minimal chronic small vessel ischemic disease.  Patient was also hyperglycemic and hypertensive, so was started on amlodipine and aspirin and discharged to home with outpatient follow-up.    Since returning home, patient has continued to have  intermittent sharp pains in her face, particularly around the left side of her mouth.  Has dull pain with brief (approximately 30 second) episodes of sharper pain.  Also with intermittent numbness and tingling of her left hand, but not of her right hand or toes.  No headaches, vision changes, pain around her temples, or neck stiffness.    Has not been taking her aspirin or statin.  __________________________________________________________        Medications:  Reviewed in EPIC  __________________________________________________________    Physical Exam:   Vital Signs:  Vitals:    07/19/23 0902   BP: 158/91   BP Position: Sitting   Pulse: 77   Resp: 16   Temp: 35.9 ??C (96.6 ??F)   TempSrc: Temporal   SpO2: 97%   Weight: 80.7 kg (178 lb)   Height: 154.9 cm (5' 1)          PTHomeBP    The patient???s Average Home Blood Pressure during the last two weeks is :   /   based on  readings            Gen: Well appearing, NAD  CV: RRR, no murmurs  Pulm: CTA bilaterally, no crackles or wheezes  Abd: Soft, NTND, normal BS.   Ext: No edema  Neuro: Upper extremity strength 5/5 bilaterally.  No focal deficits.  Decreased sensation to light touch of upper lip on left side of face.    PHQ-9 Score:  PHQ-9 Total Score: 11  GAD-7 Score:       Medication adherence and barriers to the treatment plan have been addressed. Opportunities to optimize healthy behaviors have been discussed. Patient / caregiver voiced understanding.

## 2023-08-16 NOTE — Unmapped (Signed)
 [facility] has made a second and final attempt to reach this patient to refill the following medication:secukinumab : COSENTYX  PEN (2 PENS) 150 mg/mL Pnij injection.      We have left voicemails on the following phone numbers: [phone], have sent a MyChart message, have sent a text message to the following phone numbers: [phone], and have sent a Mychart questionnaire..    Dates contacted: 04.23.2025 and 04.30.2025  Last scheduled delivery: 03.27.2025    The patient may be at risk of non-compliance with this medication. The patient should call the [facility] at [phone]  Option 4, then Option 2: Dermatology, Gastroenterology, Rheumatology to refill medication.    Marcella Serge   [facility]

## 2023-08-23 ENCOUNTER — Inpatient Hospital Stay: Admit: 2023-08-23 | Discharge: 2023-08-24 | Payer: BLUE CROSS/BLUE SHIELD

## 2023-08-23 MED ADMIN — gadopiclenol (ELUCIREM,VUEWAY) injection 8 mL: 8 mL | INTRAVENOUS | @ 19:00:00 | Stop: 2023-08-23

## 2023-08-29 NOTE — Unmapped (Signed)
 Called patient to discuss results of normal MRI/MRA.     Numbness of face and hand persists, but not affecting activities of daily living and is overall manageable. Has taken gabapentin  ~10x over the last month with decent effect, but doesn't like to take consistently because she works 3rd shift and it makes her drowsy.    Has been taking metformin  XR as prescribed, tolerating much better. Can discuss regimen further during upcoming appt with PCP.

## 2023-08-31 ENCOUNTER — Ambulatory Visit: Admit: 2023-08-31 | Discharge: 2023-09-01 | Payer: BLUE CROSS/BLUE SHIELD

## 2023-08-31 DIAGNOSIS — L4050 Arthropathic psoriasis, unspecified: Principal | ICD-10-CM

## 2023-08-31 DIAGNOSIS — H9193 Unspecified hearing loss, bilateral: Principal | ICD-10-CM

## 2023-08-31 DIAGNOSIS — E1159 Type 2 diabetes mellitus with other circulatory complications: Principal | ICD-10-CM

## 2023-08-31 DIAGNOSIS — G5612 Other lesions of median nerve, left upper limb: Principal | ICD-10-CM

## 2023-08-31 DIAGNOSIS — I152 Hypertension secondary to endocrine disorders: Principal | ICD-10-CM

## 2023-08-31 DIAGNOSIS — E1129 Type 2 diabetes mellitus with other diabetic kidney complication: Principal | ICD-10-CM

## 2023-08-31 DIAGNOSIS — R7989 Other specified abnormal findings of blood chemistry: Principal | ICD-10-CM

## 2023-08-31 DIAGNOSIS — R809 Proteinuria, unspecified: Principal | ICD-10-CM

## 2023-08-31 DIAGNOSIS — E782 Mixed hyperlipidemia: Principal | ICD-10-CM

## 2023-08-31 LAB — TSH: THYROID STIMULATING HORMONE: 4.097 u[IU]/mL (ref 0.550–4.780)

## 2023-08-31 LAB — T4, FREE: FREE T4: 0.98 ng/dL (ref 0.89–1.76)

## 2023-08-31 MED ORDER — ATORVASTATIN 20 MG TABLET
ORAL_TABLET | Freq: Every day | ORAL | 11 refills | 30.00000 days | Status: CP
Start: 2023-08-31 — End: 2024-08-30
  Filled 2023-11-15: qty 90, 90d supply, fill #0

## 2023-08-31 MED ORDER — LOSARTAN 25 MG TABLET
ORAL_TABLET | Freq: Every day | ORAL | 11 refills | 30.00000 days | Status: CP
Start: 2023-08-31 — End: 2024-08-30
  Filled 2023-11-15: qty 90, 90d supply, fill #0

## 2023-08-31 NOTE — Unmapped (Signed)
 It was good to see you today    For your finger numbness please consider using a wrist splint for carpal tunnel and wear that for a few weeks to see if it helps with your hand numbness.     For your blood pressure please start losartan and come back in 4-6 weeks for a recheck of your potassium.    For your diabetes keep up the good work and continue to measure your blood sugar as you can while taking the metformin  xr. Please come back in early July for that to be checked for the

## 2023-08-31 NOTE — Unmapped (Signed)
 [place] [facility] at [facility]     Reason for visit: Follow up    Questions / Concerns that need to be addressed:     Screening BP     Omron BPs (complete if screening BP has a systolic  > 130 or diastolic > 80)  BP#1 134/85   BP#2 140/81  BP#3 135/85    Average BP 136/84 P 75  (please note this as a comment in vitals)     PTHomeBP           Dexcom or Libre CGM in use? If so, pull appropriate reporting through portal (Dexcom) or EPIC order Jerrilyn Moras).    HCDM reviewed and updated in Epic:    We are working to make sure all of our patients??? wishes are updated in Epic and part of that is documenting a Environmental health practitioner for each patient  A Health Care Decision Maker is someone you choose who can make health care decisions for you if you are not able - who would you most want to do this for you????  was updated.    HCDM (patient stated preference): Woodward Head - Mother - [phone]    BPAs completed:  PHQ2, PHQ9, and GAD7    Annual Screenings:   Tobacco  __________________________________________________________________________________________    SCREENINGS COMPLETED IN FLOWSHEETS      AUDIT       PHQ2       PHQ9          GAD7       COPD Assessment       Falls Risk

## 2023-08-31 NOTE — Unmapped (Signed)
 Internal Medicine Clinic Visit    Reason for visit: Follow up    A/P:         No diagnosis found.      Type II Diabetes  Previous history of T2DM with A1c of 7/0 at our last visit in January of 2025 but with increased to 8.4 on repeat in April of 2024 after stopping   Patient presenting for physical today for the first time since January 2023.  At that time she been able to come off of all diabetes medicines and had an A1c of 6%.  Unfortunately when her blood sugar was most recently checked in June 2024 while at rheumatology appointment, it was found to be elevated 8.9%.  She notes that while she done a very good job with her diet back in January 2023 since that time she has really struggled to avoid sugar.  She had done well on a largely carb/keto diet but has had issues now with regularly eating sugary foods such as candy. She notes a sense of self control around sweets.  She notes she has felt overwhelmed by medications and stopped everything she was taking several months ago aside from her medications for her rheumatoid arthritis.  We discussed today initiating treatment for her type 2 diabetes.  She notes metformin  resulted in significant diarrhea that made it so it is not something she wants to try again.  She had been previously on insulin  and Ozempic  but notes she does not want to go back on these until she makes a concerted effort to restore her diet back to her previous habits.  She is willing to meet with a nutritionist which we will schedule today.  Encouraged her to consider diet that incorporates lean proteins, vegetables, and whole grains.  She notes wanting to try lifestyle changes for a month to see how she can do with her diet. She would like to come back for her scheduled repeat A1c check at the end of September ahead of an October appointment with me at which time we may discuss further medication options.  -Nutrition referral  --Albumin/creatinine urine ratio today  - A1c Future order with instructions to come the week of September 27   -- She notes she saw my eye doctor in [place]  earlier this year; we will request records today  -- Will need foot exam at next visit    2.  Hyperlipidemia  Elevated cholesterol noted previously with previous LDL of 117 in January 2023.  Notably she has taken herself off of her atorvastatin  80 mg daily for over a year now.  She wants to see what her cholesterol values today prior to reinitiating.  She did not have any specific side effects but felt overwhelmed by having to be on medications in the long-term.  Discussed the benefits of statins including reduce risk of heart attack and stroke with patient today.  - Lipid panel  - Consider resumption of atorvastatin  80 mg daily    3.  Hypertension  Patient blood pressure elevated 140/94 today.  Previously she had been on lisinopril  10 mg daily, but she has been off of this now for several months as well.  She does not want to resume medications until she incorporates her lifestyle adjustments discussed previously so we will plan to discuss at further appointments pending lifestyle modifications.  Discussed benefits of renal protection with ACE/ARB as well as reduction of risk of hypertension and stroke today.  - Encouraged home monitoring of  blood pressure  - Return for care in 1 month for consideration of antihypertensives  - Hypertension 3 panel    4. Left Shoulder Pain  She notes that left shoulder pain that had bothered her back in the winter 2024 has recurred recently.  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 She describes it as a pinching sensation in the anterior part of her left shoulder and discomfort with lowering it from a raised position.  Imaging in January of 2024 had suggested mild osseous changes at the greater tuberosity possible seen in rotator cuff tendinopathy. She benefited from a steroid injection into the left subacromial bursa in February 2024 and requested the phone number to call back to [facility] orth[facility] medicine for follow-up. That number was provided today.  -Follow up with [facility]    4.  Healthcare maintenance  -Mammogram ordered     Things to discuss at next visit  -Depression  -Vitamin D     No follow-ups on file.    Staffed with Dr. Kimel-Scott, discussed    __________________________________________________________    HPI:    She notes her diabetes has gotten worse in recent in the last year. She was doing carnivore diet and later keto for a while and saw improvement in numbers but then she resumed eating more candy and found it triggered what she felt like was a sugar addiction. She now really struggles with stopping herself from eating sugar. She would be interested in seeing a nutritionist but notes she had disagreements last time with them about whether she should be eating things labeled as keto in a way that made her not feel heard. She does not want to start medicine back until she tries to get her diet back in order.     She was using the CGM but has not done that in quite a while since she went off insulin . She also stopped taking blood pressure medicine and her statin because she  disliked being on medication..     She notes her left shoulder has started hurting in the last few weeks after feeling better following a steroid injection in February. It feels similar to how it did at that time with       [facility] is where she goes for eye monitoring. She most recently went to their office in [place].         __________________________________________________________        Medications:  Reviewed in EPIC  __________________________________________________________    Physical Exam:   Vital Signs:  There were no vitals filed for this visit.         Gen: Well appearing, NAD  CV: RRR, no murmurs  Pulm: CTA bilaterally, no crackles or wheezes  Abd: Soft, NTND, normal BS.   Ext: No edema  Shoulder: Negative empty can test. No tenderness on palpation. Joint grossly normal.       Medication adherence and barriers to the treatment plan have been addressed. Opportunities to optimize healthy behaviors have been discussed. Patient / caregiver voiced understanding.

## 2023-08-31 NOTE — Unmapped (Signed)
 Internal Medicine Clinic Visit    Reason for visit: Follow up    A/P:         1. Type 2 diabetes mellitus with diabetic microalbuminuria, without long-term current use of insulin       2. Elevated TSH    3. Mixed hyperlipidemia    4. Hypertension associated with diabetes      5. Median nerve dysfunction, left    6. Bilateral hearing loss, unspecified hearing loss type    7. Psoriatic arthritis          Median Nerve Paresthesia - Lip Tingling  Ms. Novakovich continues to have issues related to median nerve distribution reduced sensation in her left hand along with lip tingling primarily on the left side.  There has been sufficient concern for trigeminal neuralgia that she had been assessed with an MRI with my colleagues recently but that did not yield any significant findings.  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  Recommended obtaining a wrist brace and consideration of NSAIDs.  I have lower concern for trigeminal neuralgia based on today's exam and she notes that the lip tingling she experiences is not especially bothersome, so she is not want to pursue any further workup or treatment for that at this stage. Will continue to monitor.   - Recommended over-the-counter wrist splint  - Recommend over-the-counter use of NSAIDs    2.  Type 2 diabetes mellitus  In April she had an A1c elevated 8.4 which had increased from 7.0 in November 2024.  She noted that she had had increased carbohydrate intake during that period but has improved her diet since then.  She also had started on metformin  XR in early April after seeing one of my colleagues.  She notes that she has tolerated this much better than her previous use of metformin  and her blood sugars when checked have been more consistently in the 100s to 130s.  Overall expect this will lead to a much improved A1c.  - Plan for repeat A1c check in July 2025; enhanced care referral placed  --Continue Metformin  XR 500 mg daily    3.  Hypertension  Blood pressure elevated 136/84 today in clinic.  She had been started on amlodipine 5 mg in the ED after an earlier episode of hypertension earlier this year.  Given her diabetes status we will also start losartan  today for further management and mention of diabetic kidney disease.  Requested that she return in 4 to 6 weeks for recheck and check of her potassium.  - Continue amlodipine 5 mg daily  - Start losartan  25 mg daily  - Return in 4 to 6 weeks for potassium and creatinine check    4.  Hearing loss  This could have been evaluated at an audiologist in the winter 2024 and found to have urine loss with recommendations for pursuing possible hearing aids.  She notes that she has not called back but will pursue this in the future.    5.  History of elevated TSH  TSH elevated at last check in November.  She never returned for the order T4.  TSH and T4 checked today and within normal limits and will not initiate treatment.    6. Psoriatic Arthritis  Ms. Thornton Flock follows in rheumatology clinic for psoriatic arthritis and is on Cosentyx .  I received a message from  her rheumatologist Dr. Yevonne Heman regarding her failure to follow-up on calls related to refills of her medication and returning to clinic.  She notes that she does want to follow-up with rheumatology and will go up to their floor today after her visit for further conversation regarding an appointment.  -Continue Cosentyx  per rheumatology  -Return to care with rheumatology      Return in about 3 months (around 12/01/2023).    Staffed with Dr. Luvenia Salvage, discussed    __________________________________________________________    HPI:    She notes numbness and tingling over the left side her lips, and over hands. She got the MRI done which was negative. She has a history of her left shoulder. She also notices her thumb throbbing. She also notices constant lip tinigling in her lips. Occasionally her lips will throb. Even at it's worst it is not especially. She will have occasional sesnation over her left check.     She has numbness of her whole arm sometimes when she wakes up. She works third shift and naps in the recliners. Gabapentin  has made her tired.    Carpal tunnel wrist brace      She was seen at an audiologist in December and was told she has signs of hearing loss and tinnitus. She never heard back from them for follow up.     [employer] hearing assistance devices recommended as well.       She has not checked her blood sugars recently. She knew at the time she saw Dr. Merlyn Starring she was eating a lot of sweets and hush puppies.   Breakfast: Helene Loader and eggs  Then sleeps through most of the day  Dinner: Andee Kato but no hush puppies that are a cram session or will eat small meals.     She noted she was tracking her blood sugars and they were 95-130s with her old freestyle libre. She has been taking the metformin  xr which has been better tolerated. Minimal diarrhea.     Blood pressure at home has not been checked.    She is going to [place] next Thursday.     She is going to go upstairs to get a follow up for her PsA.    __________________________________________________________        Medications:  Reviewed in EPIC  __________________________________________________________    Physical Exam:   Vital Signs:  Vitals:    08/31/23 0902   BP: 136/84   BP Site: L Arm   BP Position: Sitting   BP Cuff Size: Medium   Pulse: 75   Resp: 18   Temp: 35.4 ??C (95.7 ??F)   TempSrc: Temporal   SpO2: 98%   Weight: 79.7 kg (175 lb 12.8 oz)   Height: 154.9 cm (5' 1)          PTHomeBP    The patient???s Average Home Blood Pressure during the last two weeks is :   /   based on  readings        Gen: Well appearing, NAD  CV: RRR, no murmurs  Pulm: CTA bilaterally, no crackles or wheezes  Abd: Soft, NTND, normal BS.   Ext: No edema  Neuro: Reduced sensation noted on left thumb, index finger and middle finger. Strength intact.     PHQ-9 Score:  PHQ-9 Total Score: 7  GAD-7 Score:  GAD-7 Total Score: 8    Medication adherence and barriers to the treatment plan have been addressed. Opportunities to optimize healthy behaviors have been discussed. Patient /  caregiver voiced understanding.

## 2023-09-01 NOTE — Unmapped (Signed)
 Addended by: Marshal Skeens on: 09/01/2023 08:15 AM     Modules accepted: Level of Service

## 2023-11-07 NOTE — Unmapped (Signed)
 Specialty Medication(s): Cosentyx     Julia Avila has been dis-enrolled from the [facility] specialty pharmacy services as a result of multiple unsuccessful outreach attempts by the pharmacy.    Additional information provided to the patient: last filled 07/12/23 and patient was no-show to last Rheum appointment. No future appointments scheduled at this time    [name], PharmD  [facility] Specialty Pharmacist

## 2023-11-13 DIAGNOSIS — L409 Psoriasis, unspecified: Principal | ICD-10-CM

## 2023-11-13 DIAGNOSIS — L4050 Arthropathic psoriasis, unspecified: Principal | ICD-10-CM

## 2023-11-13 DIAGNOSIS — E1129 Type 2 diabetes mellitus with other diabetic kidney complication: Principal | ICD-10-CM

## 2023-11-13 DIAGNOSIS — Z794 Long term (current) use of insulin: Principal | ICD-10-CM

## 2023-11-13 DIAGNOSIS — R809 Proteinuria, unspecified: Principal | ICD-10-CM

## 2023-11-13 MED ORDER — METFORMIN ER 500 MG TABLET,EXTENDED RELEASE 24 HR
ORAL_TABLET | Freq: Every day | ORAL | 3 refills | 100.00000 days
Start: 2023-11-13 — End: 2024-11-12

## 2023-11-13 MED ORDER — COSENTYX UNOREADY PEN 300 MG/2 ML SUBCUTANEOUS PEN INJECTOR
SUBCUTANEOUS | 3 refills | 0.00000 days | Status: CP
Start: 2023-11-13 — End: 2023-12-12
  Filled 2023-11-22: qty 8, 28d supply, fill #0

## 2023-11-15 DIAGNOSIS — L409 Psoriasis, unspecified: Principal | ICD-10-CM

## 2023-11-15 DIAGNOSIS — L4050 Arthropathic psoriasis, unspecified: Principal | ICD-10-CM

## 2023-11-20 DIAGNOSIS — Z794 Long term (current) use of insulin: Principal | ICD-10-CM

## 2023-11-20 DIAGNOSIS — E1129 Type 2 diabetes mellitus with other diabetic kidney complication: Principal | ICD-10-CM

## 2023-11-20 DIAGNOSIS — R809 Proteinuria, unspecified: Principal | ICD-10-CM

## 2023-11-20 MED ORDER — METFORMIN ER 500 MG TABLET,EXTENDED RELEASE 24 HR
ORAL_TABLET | Freq: Every day | ORAL | 11 refills | 30.00000 days | Status: CP
Start: 2023-11-20 — End: 2024-11-19

## 2023-11-21 MED ORDER — EMPTY CONTAINER
2 refills | 0.00000 days
Start: 2023-11-21 — End: ?

## 2023-11-21 NOTE — Unmapped (Signed)
 [facility]    Patient Onboarding/Medication Counseling      Patient has used Cosentyx  in the past,   stopped in Feb/March and will restart now with loading dose.     Julia Avila is a 58 y.o. female with PsA who I am counseling today on initiation of therapy.  I am speaking to the patient.    Was a Nurse, learning disability used for this call? No    Verified patient's date of birth / HIPAA.    Specialty medication(s) to be sent: Inflammatory Disorders: Cosentyx       Non-specialty medications/supplies to be sent: n/a      Medications not needed at this time: n/a         Cosentyx  (secukinumab )    The patient declined counseling on medication administration, missed dose instructions, goals of therapy, side effects and monitoring parameters, warnings and precautions, drug/food interactions, and storage, handling precautions, and disposal because they have taken the medication previously. The information in the declined sections below are for informational purposes only and was not discussed with patient.     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      Administration:     Prefilled Unoready?? auto-injector pen  Gather all supplies needed for injection on a clean, flat working surface: medication pen removed from packaging, alcohol swab, sharps container, etc.  Look at the medication label - look for correct medication, correct dose, and check the expiration date  Look at the medication - the liquid visible in the window on the side of the pen device should appear clear and colorless to slightly yellow  Lay the auto-injector pen on a flat surface and allow it to warm up to room temperature for at least  30-45 minutes  Select injection site - you can use the front of your thigh or your belly (but not the area 2 inches around your belly button); if someone else is giving you the injection you can also use your upper arm in the skin covering your triceps muscle  Prepare injection site - wash your hands and clean the skin at the injection site with an alcohol swab and let it air dry, do not touch the injection site again before the injection  Pull off the purple safety cap in the direction of the arrow, do not remove until immediately prior to injection and do not touch the red needle cover  Put the red needle cover against your skin at the injection site at a 90 degree angle, hold the pen such that you can see the clear medication window  Press down and hold the pen firmly against your skin, there will be a click when the injection starts  Continue to hold the pen firmly against your skin for about 10-15 seconds - the window will start to turn solid green  There will be a second click sound when the injection is almost complete, verify the window is solid green to indicate the injection is complete and then pull the pen away from your skin  Dispose of the used auto-injector pen immediately in your sharps disposal container the needle will be covered automatically  If you see any  blood at the injection site, press a cotton ball or gauze on the site and maintain pressure until the bleeding stops, do not rub the injection site      Adherence/Missed dose instructions:  If your injection is given more than 4 days after your scheduled injection date - consult your pharmacist for additional instructions on how to adjust your dosing schedule.        Goals of Therapy       Psoriatic arthritis  Achieve remission/inactive disease or low/minimal disease activity  Maintenance of function  Minimization of systemic manifestations and comorbidities  Maintenance of effective psychosocial functioning      Side Effects & Monitoring Parameters Injection site reaction (redness, irritation, inflammation localized to the site of administration)  Signs of a common cold - minor sore throat, runny or stuffy nose, etc.  Diarrhea    The following side effects should be reported to the provider:  Signs of a hypersensitivity reaction - rash; hives; itching; red, swollen, blistered, or peeling skin; wheezing; tightness in the chest or throat; difficulty breathing, swallowing, or talking; swelling of the mouth, face, lips, tongue, or throat; etc.  Reduced immune function - report signs of infection such as fever; chills; body aches; very bad sore throat; ear or sinus pain; cough; more sputum or change in color of sputum; pain with passing urine; wound that will not heal, etc.  Also at a slightly higher risk of some malignancies (mainly skin and blood cancers) due to this reduced immune function.  In the case of signs of infection - the patient should hold the next dose of Cosentyx ?? and call your primary care provider to ensure adequate medical care.  Treatment may be resumed when infection is treated and patient is asymptomatic.  Muscle pain or weakness  Shortness of breath      Warnings, Precautions, & Contraindications     Have your bloodwork checked as you have been told by your prescriber  Talk with your doctor if you are pregnant, planning to become pregnant, or breastfeeding  Discuss the possible need for holding your dose(s) of Cosentyx ?? when a planned procedure is scheduled with the prescriber as it may delay healing/recovery timeline       Drug/Food Interactions     Medication list reviewed in Epic. The patient was instructed to inform the care team before taking any new medications or supplements. No drug interactions identified.   If you have a latex allergy  use caution when handling, the needle cap of the Cosentyx ?? prefilled syringe and the safety cap for the Cosentyx  Sensoready?? pen contains a derivative of natural rubber latex. Unoready?? pen does NOT contain latex.   Talk with you prescriber or pharmacist before receiving any live vaccinations while taking this medication and after you stop taking it      Storage, Handling Precautions, & Disposal     Store this medication in the refrigerator.  Do not freeze  May store intact Sensoready pens and 150 mg/mL prefilled syringes at <=30??C (<=86??F) for up to 4 days; may return to the refrigerator if unused  Store in original packaging, protected from light  Do not shake  Dispose of used syringes/pens in a sharps disposal container           Current Medications (including OTC/herbals), Comorbidities and Allergies     Current Medications[1]    Allergies[2]    Problem List[3]    Medication list has been reviewed and updated in Epic: Yes    Allergies  have been reviewed and updated in Epic: Yes    Appropriateness of Therapy     Acute infections noted within Epic:  No active infections  Patient reported infection: None    Is the medication and dose appropriate based on diagnosis, medication list, comorbidities, allergies, medical history, patient???s ability to self-administer the medication, and therapeutic goals? Yes    Prescription has been clinically reviewed: Yes      Baseline Quality of Life Assessment      How many days over the past month did your PsA  keep you from your normal activities? For example, brushing your teeth or getting up in the morning. daily     Financial Information     Medication Assistance provided: Prior Authorization    Anticipated copay of $0 reviewed with patient. Verified delivery address.    Delivery Information     Scheduled delivery date: 8/7    Expected start date: 8/7      Medication will be delivered via UPS to the prescription address in [facility].  This shipment will not require a signature.      Explained the services we provide at [facility] and that each month we would call to set up refills.  Stressed importance of returning phone calls so that we could ensure they receive their medications in time each month.  Informed patient that we should be setting up refills 7-10 days prior to when they will run out of medication.  A pharmacist will reach out to perform a clinical assessment periodically.  Informed patient that a welcome packet, containing information about our pharmacy and other support services, a Notice of Privacy Practices, and a drug information handout will be sent.      The patient or caregiver noted above participated in the development of this care plan and knows that they can request review of or adjustments to the care plan at any time.      Patient or caregiver verbalized understanding of the above information as well as how to contact the pharmacy at [phone] option 4 with any questions/concerns.  The pharmacy is open Monday through Friday 8:30am-4:30pm.  A pharmacist is available 24/7 via pager to answer any clinical questions they may have.    Patient Specific Needs     Does the patient have any physical, cognitive, or cultural barriers? No    Does the patient have adequate living arrangements? (i.e. the ability to store and take their medication appropriately) Yes    Did you identify any home environmental safety or security hazards? No    Patient prefers to have medications discussed with  Patient     Is the patient or caregiver able to read and understand education materials at a high school level or above? Yes    Patient's primary language is  English     Is the patient high risk? No    Does the patient have an additional or emergency contact listed in their chart? Yes    SOCIAL DETERMINANTS OF HEALTH     At the [facility], we have learned that life circumstances - like trouble affording food, housing, utilities, or transportation can affect the health of many of our patients.   That is why we wanted to ask: are you currently experiencing any life circumstances that are negatively impacting your health and/or quality of life? No    Social Drivers of Health     Food Insecurity: No Food Insecurity (07/19/2023)  Hunger Vital Sign     Worried About Running Out of Food in the Last Year: Never true     Ran Out of Food in the Last Year: Never true   Tobacco Use: Low Risk  (08/31/2023)    Patient History     Smoking Tobacco Use: Never     Smokeless Tobacco Use: Never     Passive Exposure: Not on file   Transportation Needs: No Transportation Needs (07/19/2023)    PRAPARE - Transportation     Lack of Transportation (Medical): No     Lack of Transportation (Non-Medical): No   Alcohol Use: Not At Risk (07/19/2023)    Alcohol Use     How often do you have a drink containing alcohol?: Never     How many drinks containing alcohol do you have on a typical day when you are drinking?: 1 - 2     How often do you have 5 or more drinks on one occasion?: Never   Housing: Low Risk  (07/19/2023)    Housing     Within the past 12 months, have you ever stayed: outside, in a car, in a tent, in an overnight shelter, or temporarily in someone else's home (i.e. couch-surfing)?: No     Are you worried about losing your housing?: No   Physical Activity: Inactive (10/07/2019)    Exercise Vital Sign     Days of Exercise per Week: 0 days     Minutes of Exercise per Session: 0 min   Utilities: Low Risk  (07/19/2023)    Utilities     Within the past 12 months, have you been unable to get utilities (heat, electricity) when it was really needed?: No   Stress: Not on file   Interpersonal Safety: Not At Risk (07/19/2023)    Interpersonal Safety     Unsafe Where You Currently Live: No     Physically Hurt by Anyone: No     Abused by Anyone: No   Substance Use: Low Risk  (07/19/2023)    Substance Use     In the past year, how often have you used prescription drugs for non-medical reasons?: Never     In the past year, how often have you used illegal drugs?: Never     In the past year, have you used any substance for non-medical reasons?: No   Intimate Partner Violence: Not At Risk (07/19/2023)    Humiliation, Afraid, Rape, and Kick questionnaire     Fear of Current or Ex-Partner: No     Emotionally Abused: No     Physically Abused: No     Sexually Abused: No   Social Connections: Not on file   Financial Resource Strain: Low Risk  (07/19/2023)    Overall Financial Resource Strain (CARDIA)     Difficulty of Paying Living Expenses: Not hard at all   Health Literacy: Low Risk  (07/19/2023)    Health Literacy     : Never   Internet Connectivity: Not on file       Would you be willing to receive help with any of the needs that you have identified today? Not applicable       Julia Avila, PharmD  [facility] Specialty Pharmacist         [1]   Current Outpatient Medications   Medication Sig Dispense Refill    amlodipine (NORVASC) 5 MG tablet Take 1 tablet (5 mg total) by mouth daily.  atorvastatin  (LIPITOR) 20 MG tablet Take 1 tablet (20 mg total) by mouth daily. 30 tablet 11    clobetasoL  (TEMOVATE ) 0.05 % external solution Apply topically Two (2) times a day. (Patient not taking: Reported on 08/31/2023) 50 mL 2    gabapentin  (NEURONTIN ) 300 MG capsule Take 1 capsule (300 mg total) by mouth Three (3) times a day. 270 capsule 3    ibuprofen (ADVIL,MOTRIN) 200 MG tablet Take 1-3 tablets (200-600 mg total) by mouth.      losartan  (COZAAR ) 25 MG tablet Take 1 tablet (25 mg total) by mouth daily. 30 tablet 11    metFORMIN  (GLUCOPHAGE -XR) 500 MG 24 hr tablet Take 1 tablet (500 mg total) by mouth in the morning. 30 tablet 11    secukinumab  (COSENTYX  UNOREADY PEN) 300 mg/2 mL PnIj Inject the contents of 1 pen (300 mg) under the skin once a week for 5 doses. 10 mL 0    secukinumab  (COSENTYX  UNOREADY PEN) 300 mg/2 mL PnIj Inject the contents of 1 pen (300 mg) under the skin every twenty-eight (28) days. 6 mL 3     No current facility-administered medications for this visit.   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

## 2023-11-21 NOTE — Unmapped (Signed)
 [facility] Specialty Medication Onboarding    Specialty Medication: COSENTYX  UNOREADY PEN 300 mg/2 mL Pnij (secukinumab )  Prior Authorization: Approved   Financial Assistance: No - copay  <$25  Final Copay/Day Supply: $0 / 28 (LD+MD)    Insurance Restrictions: Yes - max 1 month supply     Notes to Pharmacist:   Credit Card on File: not applicable  Start Date on Rx:  11/13/23    The triage team has completed the benefits investigation and has determined that the patient is able to fill this medication at [facility]. Please contact the patient to complete the onboarding or follow up with the prescribing physician as needed.

## 2023-11-22 MED FILL — EMPTY CONTAINER: 120 days supply | Qty: 1 | Fill #0

## 2023-11-26 MED ORDER — METFORMIN ER 500 MG TABLET,EXTENDED RELEASE 24 HR
ORAL_TABLET | Freq: Every day | ORAL | 3 refills | 100.00000 days
Start: 2023-11-26 — End: 2024-11-25

## 2023-11-26 NOTE — Unmapped (Signed)
 Metformin  already filled in a different encounter

## 2023-11-28 ENCOUNTER — Encounter: Admit: 2023-11-28 | Discharge: 2023-11-29 | Payer: BLUE CROSS/BLUE SHIELD

## 2023-11-28 ENCOUNTER — Ambulatory Visit: Admit: 2023-11-28 | Discharge: 2023-11-29 | Payer: BLUE CROSS/BLUE SHIELD

## 2023-11-28 LAB — CREATININE
CREATININE: 0.47 mg/dL — ABNORMAL LOW (ref 0.55–1.02)
EGFR CKD-EPI (2021) FEMALE: >90 mL/min/1.73m2 (ref >=60)

## 2023-11-28 LAB — SODIUM: SODIUM: 141 mmol/L (ref 135–145)

## 2023-11-28 LAB — POTASSIUM: POTASSIUM: 4 mmol/L (ref 3.4–4.8)

## 2023-11-28 LAB — HEMOGLOBIN A1C
ESTIMATED AVERAGE GLUCOSE: 240 mg/dL
HEMOGLOBIN A1C: 10 % — ABNORMAL HIGH (ref 4.8–5.6)

## 2023-11-28 LAB — T4, FREE: FREE T4: 1.01 ng/dL (ref 0.89–1.76)

## 2023-12-08 DIAGNOSIS — R809 Proteinuria, unspecified: Principal | ICD-10-CM

## 2023-12-08 DIAGNOSIS — E1129 Type 2 diabetes mellitus with other diabetic kidney complication: Principal | ICD-10-CM

## 2023-12-08 DIAGNOSIS — I10 Essential (primary) hypertension: Principal | ICD-10-CM

## 2023-12-08 DIAGNOSIS — F321 Major depressive disorder, single episode, moderate: Principal | ICD-10-CM

## 2023-12-08 DIAGNOSIS — Z794 Long term (current) use of insulin: Principal | ICD-10-CM

## 2023-12-08 MED ORDER — AMLODIPINE 5 MG TABLET
ORAL_TABLET | Freq: Every day | ORAL | 3 refills | 90.00000 days | Status: CP
Start: 2023-12-08 — End: ?
  Filled 2023-12-12: qty 90, 90d supply, fill #0

## 2023-12-08 MED ORDER — METFORMIN ER 500 MG TABLET,EXTENDED RELEASE 24 HR
ORAL_TABLET | Freq: Every day | ORAL | 11 refills | 30.00000 days | Status: CP
Start: 2023-12-08 — End: 2024-12-07
  Filled 2024-01-25: qty 180, 90d supply, fill #0

## 2023-12-08 MED ORDER — FLUOXETINE 20 MG CAPSULE
ORAL_CAPSULE | Freq: Every day | ORAL | 2 refills | 30.00000 days | Status: CP
Start: 2023-12-08 — End: 2024-12-07
  Filled 2023-12-12: qty 30, 30d supply, fill #0

## 2023-12-08 NOTE — Unmapped (Signed)
 Internal Medicine Clinic Visit    Reason for visit: Followup     A/P:         1. Current moderate episode of major depressive disorder, unspecified whether recurrent (CMS-HCC)    2. Type 2 diabetes mellitus with microalbuminuria, with long-term current use of insulin         3. Hypertension, unspecified type        Assessment & Plan  Depression and anxiety symptoms  Symptoms likely exacerbated by current life stressors, presenting as low mood, anxiety, and anhedonia. PHQ-9 of 12 today and GAD-7 of 15. Previous positive response to SSRIs, including fluoxetine .  - Initiate fluoxetine  20 mg daily for mood stabilization, with education on potential side effects such as sleep disturbances and weight gain. Full effects expected in 4-6 weeks.  - Provide resource for therapist to explore therapy options through [email or web address].  - Schedule follow-up in 4-6 weeks to assess response to treatment.    Type 2 diabetes mellitus  Elevated A1c at 10.0 up from previous 7.0,  indicating poor glycemic control. She notes dietary indiscretion as the major driver as she has been eating as a means of coping with mental health concerns. Counseled today on avoiding processed sugar. Will plan today to increase Metformin  dose.   - Increase metformin  XR from 500 mg daily to 1000 mg daily.  - At next visit plan for retinal eye exam and urine albumin/creatinine ratio    Hypertension  Today  BP: 149/84 . Notably amlodipine  not filled for several months and it appears prescription lapsed. Renewed today and encouraged to resume taking in addition to losartan .  -Resume amlodipine  5 mg daily  -Continue losartan  25 mg daily.     Psoriatic Arthritis  Julia Avila had stopped her Cosentyx  in the spring after concerns about possibly losing her insurance, but has now resumed after symptom flares. She continues to have this filled by rheumatology with good results.   -Continue Cosentyx  per rheumatology.      Return in about 4 weeks (around 01/05/2024).    Staffed with Dr. Kimel-Scott, discussed    __________________________________________________________    HPI:    History of Present Illness  Julia Avila is a 58 year old female with diabetes and hypertension who presents with difficulty managing her blood sugar levels and stress.    She is experiencing difficulty managing her blood sugar levels, with a noted increase in her A1c. She attributes this to increased stress and changes in her eating habits, particularly consuming more sugar and carbohydrates. She mentions having been well-controlled for a year but has recently struggled. She is currently taking metformin  for diabetes and has encountered issues with medication delivery and refills.    She describes significant stress due to various life circumstances, including concerns about the political climate, potential loss of insurance, and personal relationships. 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 She also experiences stress related to her children's education and financial situations. She reports symptoms of low mood and anxiety, with episodes of feeling like her heart is pounding, uncertain if this is due to anxiety or other health issues like blood pressure or cholesterol. She has previously been on medications like Lexapro and Prozac .    She has a history of psoriasis and was on Cosentyx , which she stopped due to concerns about insurance coverage but has since restarted after realizing she could not  manage without it. She feels guilty over the cost of the medication for society, especially after needing to restart with a higher dose but notes it is not a financial strain on her presently.    She is currently taking losartan  for blood pressure and atorvastatin  for cholesterol. She is not taking previously prescribed amlodipine  and is unsure when she stopped this.     She has some support from her mother and sister but feels she cannot fully rely on them due to their own limitations and her role as the 'strong person' in her family.      __________________________________________________________        Medications:  Reviewed in EPIC  __________________________________________________________    Physical Exam:   Vital Signs:  Vitals:    12/08/23 1524   BP: 149/84   BP Site: L Arm   BP Position: Sitting   BP Cuff Size: Large   Pulse: 73   Resp: 18   Temp: 36.2 ??C (97.2 ??F)   TempSrc: Temporal   SpO2: 96%   Weight: 81.9 kg (180 lb 9.6 oz)   Height: 154.9 cm (5' 1)          PTHomeBP    The patient???s Average Home Blood Pressure during the last two weeks is :   /   based on  readings            Gen: Well appearing, NAD  CV: RRR, no murmurs  Pulm: CTA bilaterally, no crackles or wheezes  Ext: No edema      PHQ-9 Score:  PHQ-9 Total Score: 12  GAD-7 Score:  GAD-7 Total Score: 15    Medication adherence and barriers to the treatment plan have been addressed. Opportunities to optimize healthy behaviors have been discussed. Patient / caregiver voiced understanding.

## 2023-12-08 NOTE — Unmapped (Signed)
 [facility]     Type of visit: face to face    Are you located in ? (for virtual visits only)     Reason for visit: Follow up    Questions / Concerns that need to be addressed: See visit info    Omron BPs (complete if screening BP has a systolic  > 130 or diastolic > 80)  BP#1 160/83 72     BP#2 144/83 75    BP#3 143/85 73    Average BP 149/84 73  (please note this as a comment in vitals)     PTHomeBP         HCDM reviewed and updated in Epic:    We are working to make sure all of our patients??? wishes are updated in Epic and part of that is documenting a Environmental health practitioner for each patient  A Health Care Decision Maker is someone you choose who can make health care decisions for you if you are not able - who would you most want to do this for you????  is already up to date.    HCDM (patient stated preference): Ezzard Jansky - Mother - [phone]    BPAs completed:  PHQ2, PHQ9, and GAD7

## 2023-12-08 NOTE — Unmapped (Addendum)
 It was good to see you today    For your mental health concerns, I have sent a prescription for fluoxetine  (Prozac ) for you to start.    For your blood pressure, I sent a new prescription to restart your amlodipine  to help with your blood pressure.    For your diabetes, please increase  your metformin  dose to 1000 mg once daily (2 500 mg pills) and make efforts to limit sugar.

## 2023-12-13 NOTE — Unmapped (Signed)
 I reviewed with the resident the medical history and the resident???s findings on physical examination.  I discussed with the resident the patient???s diagnosis and concur with the treatment plan as documented in the resident note. Trina Ao, MD

## 2023-12-28 ENCOUNTER — Ambulatory Visit: Admit: 2023-12-28 | Payer: BLUE CROSS/BLUE SHIELD

## 2023-12-28 NOTE — Unmapped (Signed)
 Patient did not attend their scheduled rheumatology appointment today.

## 2024-01-05 ENCOUNTER — Ambulatory Visit: Admit: 2024-01-05 | Discharge: 2024-01-06 | Payer: BLUE CROSS/BLUE SHIELD

## 2024-01-05 DIAGNOSIS — R202 Paresthesia of skin: Principal | ICD-10-CM

## 2024-01-05 DIAGNOSIS — R200 Anesthesia of skin: Principal | ICD-10-CM

## 2024-01-05 DIAGNOSIS — L4050 Arthropathic psoriasis, unspecified: Principal | ICD-10-CM

## 2024-01-05 LAB — CBC W/ AUTO DIFF
BASOPHILS ABSOLUTE COUNT: 0 10*9/L (ref 0.0–0.1)
BASOPHILS RELATIVE PERCENT: 0.6 %
EOSINOPHILS ABSOLUTE COUNT: 0.1 10*9/L (ref 0.0–0.5)
EOSINOPHILS RELATIVE PERCENT: 1 %
HEMATOCRIT: 42.6 % (ref 34.0–44.0)
HEMOGLOBIN: 14.5 g/dL (ref 11.3–14.9)
LYMPHOCYTES ABSOLUTE COUNT: 2.2 10*9/L (ref 1.1–3.6)
LYMPHOCYTES RELATIVE PERCENT: 29 %
MEAN CORPUSCULAR HEMOGLOBIN CONC: 34.1 g/dL (ref 32.0–36.0)
MEAN CORPUSCULAR HEMOGLOBIN: 28 pg (ref 25.9–32.4)
MEAN CORPUSCULAR VOLUME: 82 fL (ref 77.6–95.7)
MEAN PLATELET VOLUME: 9.6 fL (ref 6.8–10.7)
MONOCYTES ABSOLUTE COUNT: 0.4 10*9/L (ref 0.3–0.8)
MONOCYTES RELATIVE PERCENT: 5.8 %
NEUTROPHILS ABSOLUTE COUNT: 4.7 10*9/L (ref 1.8–7.8)
NEUTROPHILS RELATIVE PERCENT: 63.6 %
PLATELET COUNT: 217 10*9/L (ref 150–450)
RED BLOOD CELL COUNT: 5.19 10*12/L — ABNORMAL HIGH (ref 3.95–5.13)
RED CELL DISTRIBUTION WIDTH: 13.3 % (ref 12.2–15.2)
WBC ADJUSTED: 7.4 10*9/L (ref 3.6–11.2)

## 2024-01-05 NOTE — Unmapped (Signed)
 Rheumatology return visit     REASON FOR VISIT: f/u psoriatic arthritis    HISTORY: Julia Avila is a 58 y.o. female with hx of psoriasis (since childhood) and psoriatic arthritis. Has had peripheral and axial involvement.   Treatment hx:   - mtx w/o relief  - Enbrel with good results for 9 years, then loss of efficacy  - Enbrel changed to humira in 2015, gaps in therapy due to recurrent UTIs, found to have nephrolithiasis s/p lithotripsy and ureteral dilation.   GLENWOOD Rands w/o efficacy  - Simponi w/o efficacy  - Cosentyx  since 2016.   - US  12/2022 showing OA but no synovitis of hands   - Current med regimen: cosentyx  300 mg q 28 days monotherapy     History of Present Illness  Julia Avila is a 58 year old female with psoriatic arthritis who presents with joint stiffness and numbness.    She has a history of psoriatic arthritis and was previously on Cosentyx  for many years. Approximately four months ago, she stopped the medication due to concerns about cost and insurance coverage, which led to increased joint stiffness, particularly in her fingers and other joints. She has since restarted Cosentyx , completing five loading doses, and reports improvement, though some stiffness persists.     She has type 2 diabetes, and her A1c has increased to 10 from 8 after discontinuing a carnivore diet that previously helped manage her blood sugar levels. She was able to come off insulin  while on the diet but has since seen her blood sugar levels rise. Her current medication includes metformin , which was recently increased.    Approximately four to six months ago, she experienced numbness and tingling on the left side of her face, jaw, arm, and L fingers 1-2, initially suspecting a stroke. Emergency room evaluation showed elevated blood pressure and blood sugar, but no stroke evidence. She was diagnosed with thoracic outlet syndrome by orthopedics. She has not yet pursued physical therapy for this condition. The numbness has been constant over the past week or two, particularly affecting her left hand and arm, but also affecting her lips. Symptoms seem to worsen with higher blood sugar levels.    She has not experienced significant skin psoriasis recently, though she noticed increased itchiness after stopping Cosentyx .             CURRENT MEDICATIONS:  Current Outpatient Medications   Medication Sig Dispense Refill    amlodipine  (NORVASC ) 5 MG tablet Take 1 tablet (5 mg total) by mouth daily. 90 tablet 3    atorvastatin  (LIPITOR) 20 MG tablet Take 1 tablet (20 mg total) by mouth daily. 30 tablet 11    clobetasoL  (TEMOVATE ) 0.05 % external solution Apply topically Two (2) times a day. 50 mL 2    empty container Misc USE AS DIRECTED 1 each 2    FLUoxetine  (PROZAC ) 20 MG capsule Take 1 capsule (20 mg total) by mouth daily. 30 capsule 2    gabapentin  (NEURONTIN ) 300 MG capsule Take 1 capsule (300 mg total) by mouth Three (3) times a day. 270 capsule 3    ibuprofen (ADVIL,MOTRIN) 200 MG tablet Take 1-3 tablets (200-600 mg total) by mouth.      losartan  (COZAAR ) 25 MG tablet Take 1 tablet (25 mg total) by mouth daily. 30 tablet 11    metFORMIN  (GLUCOPHAGE -XR) 500 MG 24 hr tablet Take 2 tablets (1,000 mg total) by mouth in the morning. 60 tablet 11    secukinumab  (COSENTYX  UNOREADY  PEN) 300 mg/2 mL PnIj Inject the contents of 1 pen (300 mg) under the skin once a week for 5 doses. 10 mL 0     No current facility-administered medications for this visit.       Past Medical History:   Diagnosis Date    Anxiety sometimes.    Depression     Diabetes mellitus    (CMS-HCC)     Essential hypertension 01/09/2017    Hyperlipidemia     Hypertension     Hypothyroidism     Nephrolithiasis     Obesity     Psoriasis     Typically on ears and scalp and around eyelids (arthritis is dominant finding typically for patient)    Psoriatic arthritis    (CMS-HCC)     Embril for 7 years prior to starting Humara; hips, shoulders, back, hands sometimes    Vitamin D  deficiency Record Review: Available records were reviewed, including pertinent office visits, labs, and imaging.      REVIEW OF SYSTEMS: Ten system were reviewed and negative except as noted above.    PHYSICAL EXAM:  VITAL SIGNS:   Vitals:    01/05/24 1033   BP: 147/81   BP Site: L Arm   BP Position: Sitting   BP Cuff Size: Large   Pulse: 79   Temp: 35.9 ??C (96.6 ??F)   TempSrc: Temporal   Weight: 82.4 kg (181 lb 9.6 oz)       General:   Pleasant 58 y.o.female in no acute distress, WDWN   Cardiovascular:  Regular rate and rhythm. No murmur, rub, or gallop. No lower extremity edema.    Lungs:  Clear to auscultation.Normal respiratory effort.    Musculoskeletal:   General: Ambulates w/o assistance   Hands: No swelling or tenderness.  Able to make a tight fist laterally.  Wrists: reduced ROM w/o swelling.  Tenderness of the left.  Elbows: FROM w/o swelling or tenderness   Shoulders: Painful range of motion bilaterally  Hips: Good ROM of hips b/l. Negative FABER.   Knees: FROM w/o effusions.  Cool bilaterally.  Crepitus on range of motion.  Ankles: No swelling bilaterally.  Tenderness bilaterally  Feet: No pain with MTP squeeze    Neuro:   + tinel on L    Psych:  Appropriate affect and mood   Skin:  Some red skin over R 2nd MCP. No clear psoriatic lesions.          ASSESSMENT/PLAN:  1. Psoriatic arthritis (CMS-HCC)  Stable.  Continue Cosentyx  300 mg q. 28 days. Discussed recommendation to remain at this dose for now. If she wants to try to reduce medications in the future we could consider reducing cosentyx  to 150 mg dose if she is very stable for a year or more. However, with recently flaring sx, would not reduce dose at this time.   - CBC w/ Differential    2. Numbness and tingling in left hand  Hx of thoracic outlet syndrome for which she was following ortho, but ultimately did not participate in PT. She is interested in pursuing this now. May also have comorbid carpal tunnel of the L, positive tinel on exam. She is interested to find out if she has this, so EMG ordered.   - EMG; Future     HCM:   - PCV13 Status: 05/31/2019  - PPSV 23 Status: 09/09/14  - PCV20: 04/2721  -COVID-19 vaccine: recommend obtaining   - Annual Influenza vaccine. Status: 01/05/24  -  Bone health: not on prednisone   - Contraception: Postmenopausal        Return appointment in April 2026 with Dr Shary    Greater than 40 minutes spent in visit with patient, including pre and postvisit activities.

## 2024-01-08 NOTE — Unmapped (Signed)
 [facility] Clinical Assessment & Refill Coordination Note    -Patient states she has used all 5 loading doses with next inj due 10/3    Julia Avila, DOB: Aug 31, 1965  Phone: [phone] (home)     All above HIPAA information was verified with patient.     Was a Nurse, learning disability used for this call? No    Specialty Medication(s):   Inflammatory Disorders: Cosentyx      Current Medications[1]     Changes to medications: Ellyssa reports no changes at this time.    Medication list has been reviewed and updated in Epic: Yes    Allergies[2]    Changes to allergies: No    Allergies have been reviewed and updated in Epic: Yes    SPECIALTY MEDICATION ADHERENCE     Cosentyx  300/2 mg/ml: 0 doses of medicine on hand       Medication Adherence    Patient reported X missed doses in the last month: 0  Specialty Medication: Cosentyx   Patient is on additional specialty medications: No  Informant: patient          Specialty medication(s) dose(s) confirmed: Regimen is correct and unchanged.     Are there any concerns with adherence? No    Adherence counseling provided? Not needed    CLINICAL MANAGEMENT AND INTERVENTION      Clinical Benefit Assessment:    Do you feel the medicine is effective or helping your condition? Yes    Clinical Benefit counseling provided? Progress note from 01/05/24 shows evidence of clinical benefit    Adverse Effects Assessment:    Are you experiencing any side effects? No    Are you experiencing difficulty administering your medicine? No    Quality of Life Assessment:    Quality of Life    Rheumatology  1. What impact has your specialty medication had on the reduction of your daily pain level?: Some  2. What impact has your specialty medication had on your ability to complete daily tasks (prepare meals, get dressed, etc...)?: Some  Oncology  Dermatology  Cystic Fibrosis          How many days over the past month did your condition  keep you from your normal activities? For example, brushing your teeth or getting up in the morning. This has started to help already     Have you discussed this with your provider? Yes    Acute Infection Status:    Acute infections noted within Epic:  No active infections    Patient reported infection: None    Therapy Appropriateness:    Is the medication and dose appropriate considering the patient???s diagnosis, treatment, and disease journey, comorbidities, medical history, current medications, allergies, therapeutic goals, self-administration ability, and access barriers? Yes, therapy is appropriate and should be continued     Clinical Intervention:    Was an intervention completed as part of this clinical assessment? No    DISEASE/MEDICATION-SPECIFIC INFORMATION      For patients on injectable medications: Next injection is scheduled for 10/3.    Chronic Inflammatory Diseases: Have you experienced any flares in the last month? No    PATIENT SPECIFIC NEEDS     Does the patient have any physical, cognitive, or cultural barriers? No    Is the patient high risk? No    Does the patient require physician intervention or other additional services (i.e., nutrition, smoking cessation, social work)? No    Does the patient have an additional or  emergency contact listed in their chart? Yes    SOCIAL DETERMINANTS OF HEALTH     At the [facility], we have learned that life circumstances - like trouble affording food, housing, utilities, or transportation can affect the health of many of our patients.   That is why we wanted to ask: are you currently experiencing any life circumstances that are negatively impacting your health and/or quality of life? No    Social Drivers of Health     Food Insecurity: No Food Insecurity (07/19/2023)    Hunger Vital Sign     Worried About Running Out of Food in the Last Year: Never true     Ran Out of Food in the Last Year: Never true   Tobacco Use: Low Risk  (01/05/2024)    Patient History     Smoking Tobacco Use: Never     Smokeless Tobacco Use: Never     Passive Exposure: Not on file   Transportation Needs: No Transportation Needs (07/19/2023)    PRAPARE - Transportation     Lack of Transportation (Medical): No     Lack of Transportation (Non-Medical): No   Alcohol Use: Not At Risk (07/19/2023)    Alcohol Use     How often do you have a drink containing alcohol?: Never     How many drinks containing alcohol do you have on a typical day when you are drinking?: 1 - 2     How often do you have 5 or more drinks on one occasion?: Never   Housing: Low Risk  (07/19/2023)    Housing     Within the past 12 months, have you ever stayed: outside, in a car, in a tent, in an overnight shelter, or temporarily in someone else's home (i.e. couch-surfing)?: No     Are you worried about losing your housing?: No   Physical Activity: Inactive (10/07/2019)    Exercise Vital Sign     Days of Exercise per Week: 0 days     Minutes of Exercise per Session: 0 min   Utilities: Low Risk  (07/19/2023)    Utilities     Within the past 12 months, have you been unable to get utilities (heat, electricity) when it was really needed?: No   Stress: Not on file   Interpersonal Safety: Not At Risk (01/05/2024)    Interpersonal Safety     Unsafe Where You Currently Live: No     Physically Hurt by Anyone: No     Abused by Anyone: No   Substance Use: Low Risk  (07/19/2023)    Substance Use     In the past year, how often have you used prescription drugs for non-medical reasons?: Never     In the past year, how often have you used illegal drugs?: Never     In the past year, have you used any substance for non-medical reasons?: No   Intimate Partner Violence: Not At Risk (01/05/2024)    Humiliation, Afraid, Rape, and Kick questionnaire     Fear of Current or Ex-Partner: No     Emotionally Abused: No     Physically Abused: No     Sexually Abused: No   Social Connections: Not on file   Financial Resource Strain: Low Risk  (07/19/2023)    Overall Financial Resource Strain (CARDIA)     Difficulty of Paying Living Expenses: Not hard at all   Health Literacy: Low Risk  (07/19/2023)    Health Literacy     :  Never   Internet Connectivity: Not on file       Would you be willing to receive help with any of the needs that you have identified today? Not applicable       SHIPPING     Specialty Medication(s) to be Shipped:   Inflammatory Disorders: Cosentyx     Other medication(s) to be shipped: No additional medications requested for fill at this time    Specialty Medications not needed at this time: N/A     Changes to insurance: No    Cost and Payment: Patient has a $0 copay, payment information is not required.    Delivery Scheduled: Yes, Expected medication delivery date: 9/25.     Medication will be delivered via UPS to the confirmed prescription address in [facility].    The patient will receive a drug information handout for each medication shipped and additional FDA Medication Guides as required.  Verified that patient has previously received a Conservation officer, historic buildings and a Surveyor, mining.    The patient or caregiver noted above participated in the development of this care plan and knows that they can request review of or adjustments to the care plan at any time.      All of the patient's questions and concerns have been addressed.    [name], PharmD   [facility] Specialty Pharmacist         [1]   Current Outpatient Medications   Medication Sig Dispense Refill    amlodipine  (NORVASC ) 5 MG tablet Take 1 tablet (5 mg total) by mouth daily. 90 tablet 3    atorvastatin  (LIPITOR) 20 MG tablet Take 1 tablet (20 mg total) by mouth daily. 30 tablet 11    clobetasoL  (TEMOVATE ) 0.05 % external solution Apply topically Two (2) times a day. 50 mL 2    empty container Misc USE AS DIRECTED 1 each 2    FLUoxetine  (PROZAC ) 20 MG capsule Take 1 capsule (20 mg total) by mouth daily. 30 capsule 2    gabapentin  (NEURONTIN ) 300 MG capsule Take 1 capsule (300 mg total) by mouth Three (3) times a day. 270 capsule 3    ibuprofen (ADVIL,MOTRIN) 200 MG tablet Take 1-3 tablets (200-600 mg total) by mouth.      losartan  (COZAAR ) 25 MG tablet Take 1 tablet (25 mg total) by mouth daily. 30 tablet 11    metFORMIN  (GLUCOPHAGE -XR) 500 MG 24 hr tablet Take 2 tablets (1,000 mg total) by mouth in the morning. 60 tablet 11    secukinumab  (COSENTYX  UNOREADY PEN) 300 mg/2 mL PnIj Inject the contents of 1 pen (300 mg) under the skin every twenty-eight (28) days. 6 mL 3     No current facility-administered medications for this visit.   [2]   Allergies  Allergen Reactions    Nitrofurantoin  Macrocrystal Rash     Large red welts all over her body    Metformin  Diarrhea

## 2024-01-10 MED FILL — COSENTYX UNOREADY PEN 300 MG/2 ML SUBCUTANEOUS PEN INJECTOR: SUBCUTANEOUS | 28 days supply | Qty: 2 | Fill #0

## 2024-01-18 NOTE — Unmapped (Unsigned)
 Internal Medicine Clinic Visit    Reason for visit: Followup     A/P:         1. Type 2 diabetes mellitus with microalbuminuria, with long-term current use of insulin  (CMS-HCC)    2. Current moderate episode of major depressive disorder, unspecified whether recurrent (CMS-HCC)    3. Hypertension associated with diabetes (CMS-HCC)        Assessment & Plan    MDD - GAD  Improvement noted with fluoxetine  20 mg with increased social engagement and reduced negative feelings, though she has had persistent anxiety due to external factors related to the state of the world and feels she could stand to have further improvement in mental health. PHQ-9 score of 12 and GAD-7 score of 15 stable from prior check 6 weeks prior before initiation of fluoxetine . She also noted ongoing issues with a sense of loss of control while eating and associated guilt that is suggestive of possible binge eating behaviors. Discussed seeking a therapist who could help with this via CBT, which she was interested in.    - Increase fluoxetine  to 40 mg daily.  - Provided information on [email or web address] for therapist search.  - Encouraged continued social engagement and self-care.    Type 2 diabetes mellitus   A1c at last visit found to be elevated to 10.0 from previous 7.0 in the setting of resuming high carbohydrate consumption amidst worsened mental health. Metformin  XR increased from 500 mg to 1000 mg daily. She feels her numbers are unlikely to be better soon as she continues to eat carbohydrates heavily. She expressed interest in resuming ozempic  which she had taken in 2022 to good effect before being able to come off of it.  -Start ozempic  0.25 mg weekly for 4 weeks with plans to titrate to 0.5 mg for 4 weeks if well tolerated.  -Continue metformin  XR 1000 mg daily     Essential hypertension  Blood pressure well-controlled on amlodipine  and losartan  (123/81 today).  - Continue amlodipine  5 mg daily  - Continue losartan  25 mg daily    Return in about 2 months (around 03/20/2024).    Staffed with Dr. Kimel-Scott, discussed    __________________________________________________________    HPI:    History of Present Illness  Julia Avila is a 58 year old female with depression and anxiety who presents for follow-up on mood medication.    She was restarted on fluoxetine  20 mg daily for her mood approximately four weeks ago. Initially, she did not notice any changes, but in the last two weeks, she has experienced improvements. She feels less 'dooming' and 'edgy' and notes a positive change in her mood, although it is not perfect. She has been able to engage more in her hobbies, such as maintaining her aquariums, which she had previously neglected due to lack of motivation.    She has a history of ADD diagnosed in the past but is not currently on medication for it. She mentions difficulty in completing projects and maintaining focus, which she attributes to her ADD. Despite this, she has been able to engage in her hobby of maintaining aquariums, which she finds fulfilling.    She experiences anxiety, particularly related to life events, politics, and the future. Her anxiety is lessened in social situations with friends and family, although she still feels anxious about the future of the country. She describes a significant fear related to political issues, which remains unchanged despite medication.    She also takes amlodipine  and losartan   for blood pressure management. No recent heart flutters, which she previously associated with anxiety.    She struggles with diet and weight management, noting a tendency to consume sugar-heavy foods. She describes a cycle of craving sugar, consuming it, and then feeling guilty. She has attempted dietary changes in the past, such as the carnivore diet, but finds sugar cravings difficult to manage. Her social activities often involve eating out, which complicates her dietary goals.          __________________________________________________________        Medications:  Reviewed in EPIC  __________________________________________________________    Physical Exam:   Vital Signs:  Vitals:    01/19/24 1352   BP: 123/81   BP Site: L Arm   BP Position: Sitting   BP Cuff Size: Medium   Pulse: 87   Resp: 18   Temp: 35.7 ??C (96.3 ??F)   TempSrc: Temporal   SpO2: 96%   Weight: 82.7 kg (182 lb 6.4 oz)   Height: 157.5 cm (5' 2)            PTHomeBP    The patient???s Average Home Blood Pressure during the last two weeks is :   /   based on  readings      Gen: Well appearing, NAD  CV: RRR, no murmurs  Pulm: CTA bilaterally, no crackles or wheezes  Ext: No edema      PHQ-9 Score:  PHQ-9 Total Score: 12  GAD-7 Score:  GAD-7 Total Score: 15    Medication adherence and barriers to the treatment plan have been addressed. Opportunities to optimize healthy behaviors have been discussed. Patient / caregiver voiced understanding. She has attempted dietary changes in the past, such as the carnivore diet, but finds sugar cravings difficult to manage. Her social activities often involve eating out, which complicates her dietary goals.          __________________________________________________________        Medications:  Reviewed in EPIC  __________________________________________________________    Physical Exam:   Vital Signs:  There were no vitals filed for this visit.         PTHomeBP    The patient???s Average Home Blood Pressure during the last two weeks is :   /   based on  readings            Gen: Well appearing, NAD  CV: RRR, no murmurs  Pulm: CTA bilaterally, no crackles or wheezes  Ext: No edema      PHQ-9 Score:     GAD-7 Score:       Medication adherence and barriers to the treatment plan have been addressed. Opportunities to optimize healthy behaviors have been discussed. Patient / caregiver voiced understanding.

## 2024-01-19 ENCOUNTER — Encounter: Admit: 2024-01-19 | Discharge: 2024-01-19 | Payer: BLUE CROSS/BLUE SHIELD

## 2024-01-19 DIAGNOSIS — E1129 Type 2 diabetes mellitus with other diabetic kidney complication: Principal | ICD-10-CM

## 2024-01-19 DIAGNOSIS — R809 Proteinuria, unspecified: Principal | ICD-10-CM

## 2024-01-19 DIAGNOSIS — F321 Major depressive disorder, single episode, moderate: Principal | ICD-10-CM

## 2024-01-19 DIAGNOSIS — Z794 Long term (current) use of insulin: Principal | ICD-10-CM

## 2024-01-19 LAB — ALBUMIN / CREATININE URINE RATIO
ALBUMIN QUANT URINE: 0.4 mg/dL
ALBUMIN/CREATININE RATIO: 13.8 ug/mg (ref 0.0–30.0)
CREATININE, URINE: 28.9 mg/dL

## 2024-01-19 MED ORDER — FLUOXETINE 20 MG CAPSULE
ORAL_CAPSULE | Freq: Every day | ORAL | 2 refills | 30.00000 days | Status: CP
Start: 2024-01-19 — End: 2025-01-18

## 2024-01-19 MED ORDER — OZEMPIC 0.25 MG OR 0.5 MG (2 MG/3 ML) SUBCUTANEOUS PEN INJECTOR
SUBCUTANEOUS | 0 refills | 56.00000 days | Status: CP
Start: 2024-01-19 — End: 2024-03-15
  Filled 2024-01-25: qty 3, 42d supply, fill #0

## 2024-01-19 NOTE — Unmapped (Signed)
 It was good to see you today    For your mood, please increase your fluoxetine  (Prozac ) from 20 mg to 40 mg.    For your eating issues, please seek out a therapist on [email or web address] and search for terms related to binge eating and cognitive behavioral therapy (CBT).    For your diabetes and weight, let's plan to restart your Ozempic . Please start with once weekly injections 0.25 mg for one month followed by 0.5 mg for one month.

## 2024-01-19 NOTE — Unmapped (Signed)
 [facility]   Patient stated already had eye exam [facility] in [place]  Reason for visit: Follow up    Questions / Concerns that need to be addressed: Patient stated medications    Screening BP 123/81 P 87    Omron BPs (complete if screening BP has a systolic  > 130 or diastolic > 80)  BP#1    BP#2   BP#3   Average BP   (please note this as a comment in vitals)     PTHomeBP           Dexcom or Libre CGM in use? If so, pull appropriate reporting through portal (Dexcom) or EPIC order [facility]).    HCDM reviewed and updated in Epic:    We are working to make sure all of our patients??? wishes are updated in Epic and part of that is documenting a Environmental health practitioner for each patient  A Health Care Decision Maker is someone you choose who can make health care decisions for you if you are not able - who would you most want to do this for you????  was updated.    HCDM (patient stated preference): Ezzard Jansky - Mother - [phone]    BPAs completed:  PHQ2, PHQ9, GAD7, and Diabetes - urine albumin/creatinine ratio    Annual Screenings:   Tobacco  __________________________________________________________________________________________    SCREENINGS COMPLETED IN FLOWSHEETS      AUDIT       PHQ2       PHQ9          GAD7       COPD Assessment       Falls Risk

## 2024-01-22 MED ORDER — FLUOXETINE 20 MG CAPSULE
ORAL_CAPSULE | Freq: Every day | ORAL | 2 refills | 15.00000 days | Status: CP
Start: 2024-01-22 — End: 2025-01-21

## 2024-01-22 NOTE — Unmapped (Signed)
 I reviewed with the resident the medical history and the resident???s findings on physical examination.  I discussed with the resident the patient???s diagnosis and concur with the treatment plan as documented in the resident note. Trina Ao, MD

## 2024-01-24 DIAGNOSIS — F321 Major depressive disorder, single episode, moderate: Principal | ICD-10-CM

## 2024-01-24 MED ORDER — FLUOXETINE 20 MG CAPSULE
ORAL_CAPSULE | Freq: Every day | ORAL | 11 refills | 30.00000 days | Status: CP
Start: 2024-01-24 — End: 2025-01-23
  Filled 2024-01-25: qty 60, 30d supply, fill #0

## 2024-02-01 NOTE — Unmapped (Signed)
 [facility] Specialty and Home Delivery Pharmacy Refill Coordination Note    Specialty Medication(s) to be Shipped:   Inflammatory Disorders: Cosentyx     Other medication(s) to be shipped: No additional medications requested for fill at this time    Specialty Medications not needed at this time: N/A     Julia Avila, DOB: 12-03-1965  Phone: [phone] (home)       All above HIPAA information was verified with patient.     Was a Nurse, learning disability used for this call? No    Completed refill call assessment today to schedule patient's medication shipment from the [facility] and Home Delivery Pharmacy  [phone]).  All relevant notes have been reviewed.     Specialty medication(s) and dose(s) confirmed: Regimen is correct and unchanged.   Changes to medications: Tenley reports no changes at this time.  Changes to insurance: No  New side effects reported not previously addressed with a pharmacist or physician: None reported  Questions for the pharmacist: No    Confirmed patient received a Conservation officer, historic buildings and a Surveyor, mining with first shipment. The patient will receive a drug information handout for each medication shipped and additional FDA Medication Guides as required.       DISEASE/MEDICATION-SPECIFIC INFORMATION        For patients on injectable medications: Next injection is scheduled for 10.24.25.    SPECIALTY MEDICATION ADHERENCE     Medication Adherence    Patient reported X missed doses in the last month: 0  Specialty Medication: secukinumab : COSENTYX  UNOREADY PEN 300 mg/2 mL Pnij  Patient is on additional specialty medications: No              Were doses missed due to medication being on hold? No      secukinumab : COSENTYX  UNOREADY PEN 300 mg/2 mL Pnij: 0 doses of medicine on hand       REFERRAL TO PHARMACIST     Referral to the pharmacist: Not needed      SHIPPING     Shipping address confirmed in Epic.     Cost and Payment: Patient has a $0 copay, payment information is not required.    Delivery Scheduled: Yes, Expected medication delivery date: 10.22.25.     Medication will be delivered via UPS to the prescription address in Epic WAM.    Doyal Hurst   [facility] Specialty and Home Delivery Pharmacy  Specialty Technician

## 2024-02-06 MED FILL — COSENTYX UNOREADY PEN 300 MG/2 ML SUBCUTANEOUS PEN INJECTOR: SUBCUTANEOUS | 28 days supply | Qty: 2 | Fill #1

## 2024-02-14 MED FILL — ATORVASTATIN 20 MG TABLET: ORAL | 90 days supply | Qty: 90 | Fill #1

## 2024-02-14 MED FILL — LOSARTAN 25 MG TABLET: ORAL | 90 days supply | Qty: 90 | Fill #1

## 2024-02-28 ENCOUNTER — Inpatient Hospital Stay
Admit: 2024-02-28 | Discharge: 2024-02-29 | Payer: BLUE CROSS/BLUE SHIELD | Attending: Student in an Organized Health Care Education/Training Program | Primary: Student in an Organized Health Care Education/Training Program

## 2024-02-28 NOTE — Procedures (Signed)
 [facility]  Clinical Neurophysiology Laboratory  [place], [place]         Patient: Julia Avila Sex: Female  [place] #: [id number] Date of Birth: February 19, 1966      Visit Date: 02/28/2024 2:22 PM  Age: 58 Years  Performing MD: Antonetta Grimmer, MD   Ref Provider: Cooper Blush, PA  Tech: Rockey [place]   Room: 2   Height: 5 feet 2 inch  History: has a h/o left shoulder pain, had an episdoe of an acute onset of the left hand and face numbness a year ago and presented to the Er for evaluation of a stroke. Sometimes she has nubness in her left thumnb and sometimes the second and third finger.       Sensory NCS      Nerve / Sites Rec. Site Peak Lat Ref. PP Amp Ref. Distance Vel.     ms ms ??V ??V cm m/s   L Median - Dig II (Antidromic)      Wrist Index 3.38 <=3.80 47.9 >=10.0 14 51   R Median - Dig II (Antidromic)      Wrist Index 3.52 <=3.80 43.1 >=10.0 14 55   L Ulnar - Dig V (Antidromic)      Wrist Dig V 3.48 <=3.80 23.4 >=10.0 14 49   L Radial - Superficial (Antidromic)      Forearm Wrist 2.08 <=2.80 33.8  10 71       Motor NCS      Nerve / Sites Muscle Latency Ref. Amplitude Ref. Dur. Distance Lat Diff Velocity Ref.     ms ms mV mV ms cm ms m/s m/s   L Median - APB      Wrist APB 3.17 <=4.40 12.5 >=4.2 5.38 8         Elbow APB 6.88  11.9  5.90 21 3.71 56.6 >=49.0   L Ulnar - ADM      Wrist ADM 2.98 <=3.70 9.7 >=5.6 5.04 8         B.Elbow ADM 5.90  9.1  5.17 15 2.92 51.4 >=49.0      A.Elbow ADM 7.90  8.9  5.35 11 2.00 55.0 >=49.0       F  Wave      Nerve F min Ref.    ms ms   L Median - APB 25.2 <=31.0   L Ulnar - ADM 24.8 <=31.0       EMG Summary Table    Spontaneous MUP Recruitment Comment   Muscle Nerve Roots Fib PSW Fasc Other Amp Dur. PPP Pattern Other   L. Deltoid Axillary C5-C6 None None None None N N N Normal None   L. Triceps brachii Radial C6-C8 None None None None N N N Normal None   L. First dorsal interosseous Ulnar C8-T1 None None None None N N N Normal None       Summary of NCS:  After identifying the patient in the waiting room and reviewing all appropriately available medical records, the patient was taken back to the examination room where the procedure was explained, the sites of examination were noted, the patient's questions were answered and the patient's verbal consent for the procedure was obtained.  Studies were performed at 69??C using a radiant warmer and CareFusion Synergy EMG System.    Nerve conduction studies were performed in selected b/l upper extremity sensory and motor nerves, as recorded in the tables.    All sensory and motor studies were normal.  Summary of needle EMG  Needle examination was performed with a disposable concentric needle electrode in selected muscles of the symptomatic left upper extremity, as described in the EMG narrative.    Insertional activity was normal and there was no abnormal spontaneous activity at rest. MUAPs were of normal size and demonstrated normal recruitment.    Conclusions:  Normal study. There is no electrodiagnostic evidence for large fiber polyneuropathy, entrapment neuropathy or cervical radiculopathy on the left upper limb.    - - - - - - - - - - - -  Attending Electromyographer  Antonetta Grimmer, MD  Clinical Associate Professor  Division of Neuromuscular Disorders  Department of Neurology  [place], [place]

## 2024-03-01 MED FILL — FLUOXETINE 20 MG CAPSULE: ORAL | 30 days supply | Qty: 60 | Fill #1

## 2024-03-01 NOTE — Progress Notes (Addendum)
 [facility] Specialty and Home Delivery Pharmacy Refill Coordination Note    Specialty Medication(s) to be Shipped:   Inflammatory Disorders: Cosentyx  and Specialty Lite: Ozempic     Other medication(s) to be shipped: No additional medications requested for fill at this time    Specialty Medications not needed at this time: N/A     Julia Avila, DOB: [birth year]  Phone: [phone] (home)       All above HIPAA information was verified with patient.     Was a nurse, learning disability used for this call? No    Completed refill call assessment today to schedule patient's medication shipment from the [facility] and Home Delivery Pharmacy  [phone]).  All relevant notes have been reviewed.     Specialty medication(s) and dose(s) confirmed: Regimen is correct and unchanged.   Changes to medications: Krystalyn reports no changes at this time.  Changes to insurance: No  New side effects reported not previously addressed with a pharmacist or physician: None reported  Questions for the pharmacist: No    Confirmed patient received a Conservation Officer, Historic Buildings and a Surveyor, Mining with first shipment. The patient will receive a drug information handout for each medication shipped and additional FDA Medication Guides as required.       DISEASE/MEDICATION-SPECIFIC INFORMATION        For patients on injectable medications: Next injection is scheduled for 11/21.    SPECIALTY MEDICATION ADHERENCE     Medication Adherence    Patient reported X missed doses in the last month: 0  Specialty Medication: COSENTYX  UNOREADY PEN 300 mg/2 mL Pnij (secukinumab )  Patient is on additional specialty medications: No              Were doses missed due to medication being on hold? No    Cosentyx  300/2 mg/ml: 0 doses of medicine on hand        REFERRAL TO PHARMACIST     Referral to the pharmacist: Not needed      [facility]     Shipping address confirmed in Epic.     Cost and Payment: Patient has a $0 copay, payment information is not required.    Delivery Scheduled: Yes, Expected medication delivery date: 03/06/24.     Medication will be delivered via UPS to the prescription address in Epic WAM.    Kelly CHRISTELLA Eagles   [facility] Specialty and Home Delivery Pharmacy  Specialty Technician

## 2024-03-05 MED FILL — COSENTYX UNOREADY PEN 300 MG/2 ML SUBCUTANEOUS PEN INJECTOR: SUBCUTANEOUS | 28 days supply | Qty: 2 | Fill #2

## 2024-03-05 MED FILL — OZEMPIC 0.25 MG OR 0.5 MG (2 MG/3 ML) SUBCUTANEOUS PEN INJECTOR: SUBCUTANEOUS | 28 days supply | Qty: 3 | Fill #1

## 2024-03-26 DIAGNOSIS — E1129 Type 2 diabetes mellitus with other diabetic kidney complication: Principal | ICD-10-CM

## 2024-03-26 DIAGNOSIS — R809 Proteinuria, unspecified: Principal | ICD-10-CM

## 2024-03-26 DIAGNOSIS — Z794 Long term (current) use of insulin: Principal | ICD-10-CM

## 2024-03-26 MED ORDER — OZEMPIC 0.25 MG OR 0.5 MG (2 MG/3 ML) SUBCUTANEOUS PEN INJECTOR
SUBCUTANEOUS | 0 refills | 56.00000 days
Start: 2024-03-26 — End: 2024-05-21

## 2024-03-27 MED ORDER — OZEMPIC 0.25 MG OR 0.5 MG (2 MG/3 ML) SUBCUTANEOUS PEN INJECTOR
SUBCUTANEOUS | 0 refills | 56.00000 days | Status: CP
Start: 2024-03-27 — End: 2024-05-22

## 2024-04-04 NOTE — Progress Notes (Signed)
 [facility] Specialty and Home Delivery Pharmacy Refill Coordination Note    Specialty Medication(s) to be Shipped:   Inflammatory Disorders: Cosentyx     Other medication(s) to be shipped: Ozempic     Specialty Medications not needed at this time: N/A     Julia Avila, DOB: 1965-10-21  Phone: [phone] (home)       All above HIPAA information was verified with patient.     Was a nurse, learning disability used for this call? No    Completed refill call assessment today to schedule patient's medication shipment from the [facility] and Home Delivery Pharmacy  [phone]).  All relevant notes have been reviewed.     Specialty medication(s) and dose(s) confirmed: Regimen is correct and unchanged.   Changes to medications: Julia Avila reports no changes at this time.  Changes to insurance: No  New side effects reported not previously addressed with a pharmacist or physician: None reported  Questions for the pharmacist: No    Confirmed patient received a Conservation Officer, Historic Buildings and a Surveyor, Mining with first shipment. The patient will receive a drug information handout for each medication shipped and additional FDA Medication Guides as required.       DISEASE/MEDICATION-SPECIFIC INFORMATION        N/A    SPECIALTY MEDICATION ADHERENCE     Medication Adherence    Patient reported X missed doses in the last month: 0  Specialty Medication: COSENTYX  UNOREADY PEN 300 mg/2 mL Pnij (secukinumab )  Patient is on additional specialty medications: No              Were doses missed due to medication being on hold? No      COSENTYX  UNOREADY PEN 300 mg/2 mL Pnij (secukinumab ): 0 doses of medicine on hand       Specialty medication is an injection or given on a cycle: Yes, Next injection is scheduled for 12.26.25.    REFERRAL TO PHARMACIST     Referral to the pharmacist: Not needed      [facility]     Shipping address confirmed in Epic.     Cost and Payment: Patient has a $0 copay, payment information is not required.    Delivery Scheduled: Yes, Expected medication delivery date: 12.23.25.     Medication will be delivered via UPS to the prescription address in Epic WAM.    Julia Avila   [facility] Specialty and Home Delivery Pharmacy  Specialty Technician

## 2024-04-05 DIAGNOSIS — R350 Frequency of micturition: Principal | ICD-10-CM

## 2024-04-05 DIAGNOSIS — Z794 Long term (current) use of insulin: Principal | ICD-10-CM

## 2024-04-05 DIAGNOSIS — B001 Herpesviral vesicular dermatitis: Principal | ICD-10-CM

## 2024-04-05 DIAGNOSIS — E1129 Type 2 diabetes mellitus with other diabetic kidney complication: Principal | ICD-10-CM

## 2024-04-05 DIAGNOSIS — R809 Proteinuria, unspecified: Principal | ICD-10-CM

## 2024-04-05 MED ORDER — VALACYCLOVIR 1 GRAM TABLET
ORAL_TABLET | Freq: Two times a day (BID) | ORAL | 0 refills | 3.00000 days | Status: CP
Start: 2024-04-05 — End: 2024-04-08

## 2024-04-05 MED ORDER — OZEMPIC 0.25 MG OR 0.5 MG (2 MG/3 ML) SUBCUTANEOUS PEN INJECTOR
SUBCUTANEOUS | 11 refills | 28.00000 days | Status: CP
Start: 2024-04-05 — End: 2024-05-03
  Filled 2024-04-08: qty 3, 28d supply, fill #0

## 2024-04-05 NOTE — Progress Notes (Signed)
 Julia Avila at [facility]     Reason for visit: Follow up BP, DM    Questions / Concerns that need to be addressed:  fever blister , labs for thyroid. Taking gabapentin  PRN.       Omron BPs (complete if screening BP has a systolic  > 130 or diastolic > 80)  BP#1   148/89  75  BP#2    139/86  75  BP#3    135/86  79       Average BP   141/87  76  (please note this as a comment in vitals)     PTHomeBP     Diabetes:  Regularly checking blood sugars?: no  If yes, when? Complete log for past 7 days  Date Before Breakfast After Breakfast Before Lunch After Lunch Before Dinner After Dinner Before Bed                                                                                                                                     Dexcom or Libre CGM in use? If so, pull appropriate reporting through portal (Dexcom) or EPIC order Ladoris).    HCDM reviewed and updated in Epic:    We are working to make sure all of our patients??? wishes are updated in Epic and part of that is documenting a Environmental Health Practitioner for each patient  A Health Care Decision Maker is someone you choose who can make health care decisions for you if you are not able - who would you most want to do this for you????  is already up to date.    HCDM (patient stated preference): Julia Avila - Mother - [phone]    BPAs completed:  PHQ9 and GAD7    __________________________________________________________________________________________    SCREENINGS COMPLETED IN FLOWSHEETS      AUDIT       PHQ2       PHQ9  Thoughts that you would be better off dead, or of hurting yourself in some way: Not at all  PHQ-9 Total Score: 4    GAD7    Over the last 2 weeks, how often have you been bothered by the following problems?  Feeling nervous, anxious or on edge: More than half the days  Not being able to stop or control worrying: More than half the days  Worrying too much about different things: Several days  Trouble relaxing: Not at all  Being so restless that it is hard to sit still: Not at all  Becoming easily annoyed or irritable: Several days  Feeling afraid as if something awful might happen: Several days  GAD-7 Total Score: 7  How difficult have these problems made it for you to do your work, take care of things at home, or get along with other people?: Not at all difficult    COPD Assessment       Falls Risk

## 2024-04-05 NOTE — Progress Notes (Signed)
 Internal Medicine Clinic Visit    Reason for visit: Followup     A/P:         1. Urinary frequency    2. Type 2 diabetes mellitus with microalbuminuria, with long-term current use of insulin  (CMS-HCC)    3. Cold sore    4. Median nerve dysfunction, left    5. Hypertension associated with diabetes (CMS-HCC)        Assessment & Plan    MDD - GAD  At last visit fluoxetine  increased to 40 mg.  She notes that since that time her mental health has stabilized and she is in the best mental health space she has been in some time with PHQ-9 improved to 4 and GAD-7 score improved to 7 down from 12 and 15 respectively at last visit.  She has not pursued meeting with a therapist as we discussed at our last visit but overall feels satisfied with her mental health is.  She also notes that her binge eating has improved.  Will continue current therapy.  - Continue fluoxetine  40 mg daily      Type 2 diabetes mellitus   At last visit A1c at last visit found to be elevated to 10.0 from previous 7.0 in the setting of resuming high carbohydrate consumption amidst worsened mental health.  We had started Ozempic .  She notes she tolerated the 0.25 mg dose and increase to 0.5 mg.  She has had some diarrhea but overall is tolerating.  A1c today improved to 7.9 from previous 10.0.  Given GI side effects we will not increase dose at this time but may consider further titration pending clinical tolerance.  -Continue Ozempic  0.5 mg weekly  -Continue metformin  XR 1000 mg daily   --Retinal eye exam scheduled for February 2026, encouraged faxing of records to our office    Essential hypertension  Blood pressure 135/86 today.  Previously very well-controlled.  Encouraged her to take some home blood pressure measurements and send via mychart. .  - Continue amlodipine  5 mg daily  - Continue losartan  25 mg daily    Finger Tingling in Median Nerve Distribution of Left Hand  She notes ongoing finger tingling in medial nerve distribution for left hand. EMG on 03/04/2024 had not shown signs of carpal tunnel or other neuropathy. Encouraged trial of braces though overall unclear driver at this stage.  -Encourage OTC wrist braces.     Cold Sore  Cold sore noted on upper lip that recently started and is very uncomfortable.   -Valtrex  2000 mg BID for three days with cold sore    Urinary Frequency  She endorses several days of urinary frequency. POCT UA without signs of infection. Will continue to monitor.    Return in about 3 months (around 07/04/2024).    Staffed with Dr. Kimel-Scott, discussed    __________________________________________________________    HPI:  She notes some increase in urinary frequency and some foul odor.    She notes that since she increased metformin  she has had some diarrhea with things. Sometimes she will have a bit nausea. Other times she will still have a good appetitie.      She had neurology follow up witih tingling and now is seeing chiropractor. She notes she is still having some of the same issues. She still will have tingling on her upper lip and hands. TELL ABOUT WRIST BRACES    She feels her diet is much better since she is not having the same cravings she had and  her appetite is better. But now she is not overeating.     Her depression is doing a lot better. She is not having crying spells any more. She did not start with a therapist.     She will have an eye exam in February. She is seen at [facility]     She got hearing aides as well    Will check Bps at home.       __________________________________________________________        Medications:  Reviewed in EPIC  __________________________________________________________    Physical Exam:   Vital Signs:  Vitals:    04/05/24 1325   BP: 135/86   BP Site: L Arm   Pulse: 76   Temp: 37.1 ??C (98.8 ??F)   TempSrc: Oral   SpO2: 95%   Weight: 81.6 kg (179 lb 12.8 oz)            PTHomeBP    The patient???s Average Home Blood Pressure during the last two weeks is :   /   based on readings      Gen: Well appearing, NAD  CV: RRR, no murmurs  Pulm: CTA bilaterally, no crackles or wheezes  Ext: No edema      PHQ-9 Score:  PHQ-9 Total Score: 4  GAD-7 Score:  GAD-7 Total Score: 7    Medication adherence and barriers to the treatment plan have been addressed. Opportunities to optimize healthy behaviors have been discussed. Patient / caregiver voiced understanding.

## 2024-04-05 NOTE — Patient Instructions (Addendum)
 It was good to see you today    Please continue your current dose of 0.5 mg of ozempic  weekly.     Please consider using wrist braces to sleep to see if this helps with possible carpal tunnel of your left hand

## 2024-04-07 DIAGNOSIS — R200 Anesthesia of skin: Principal | ICD-10-CM

## 2024-04-07 DIAGNOSIS — G500 Trigeminal neuralgia: Principal | ICD-10-CM

## 2024-04-07 MED ORDER — GABAPENTIN 300 MG CAPSULE
ORAL_CAPSULE | Freq: Three times a day (TID) | ORAL | 3 refills | 90.00000 days
Start: 2024-04-07 — End: 2025-04-07

## 2024-04-08 MED FILL — AMLODIPINE 5 MG TABLET: ORAL | 90 days supply | Qty: 90 | Fill #1

## 2024-04-08 MED FILL — METFORMIN ER 500 MG TABLET,EXTENDED RELEASE 24 HR: ORAL | 90 days supply | Qty: 180 | Fill #1

## 2024-04-08 MED FILL — FLUOXETINE 20 MG CAPSULE: ORAL | 30 days supply | Qty: 60 | Fill #2

## 2024-04-08 MED FILL — COSENTYX UNOREADY PEN 300 MG/2 ML SUBCUTANEOUS PEN INJECTOR: SUBCUTANEOUS | 28 days supply | Qty: 2 | Fill #3

## 2024-04-15 NOTE — Progress Notes (Signed)
 I reviewed with the resident the medical history and the resident???s findings on physical examination.  I discussed with the resident the patient???s diagnosis and concur with the treatment plan as documented in the resident note. Trina Ao, MD

## 2024-04-29 NOTE — Progress Notes (Signed)
 [facility] Specialty and Home Delivery Pharmacy Refill Coordination Note    Specialty Medication(s) to be Shipped:   Inflammatory Disorders: Cosentyx     Other medication(s) to be shipped: Ozempic     Specialty Medications not needed at this time: N/A     Julia Avila, DOB: 17-Nov-1965  Phone: [phone] (home)       All above HIPAA information was verified with patient.     Was a nurse, learning disability used for this call? No    Completed refill call assessment today to schedule patient's medication shipment from the [facility] and Home Delivery Pharmacy  [phone]).  All relevant notes have been reviewed.     Specialty medication(s) and dose(s) confirmed: Regimen is correct and unchanged.   Changes to medications: Avonne reports no changes at this time.  Changes to insurance: No  New side effects reported not previously addressed with a pharmacist or physician: None reported  Questions for the pharmacist: No    Confirmed patient received a Conservation Officer, Historic Buildings and a Surveyor, Mining with first shipment. The patient will receive a drug information handout for each medication shipped and additional FDA Medication Guides as required.       DISEASE/MEDICATION-SPECIFIC INFORMATION        N/A    SPECIALTY MEDICATION ADHERENCE     Medication Adherence    Patient reported X missed doses in the last month: 0  Specialty Medication: secukinumab : COSENTYX  UNOREADY PEN 300 mg/2 mL Pnij  Patient is on additional specialty medications: No              Were doses missed due to medication being on hold? No      secukinumab : COSENTYX  UNOREADY PEN 300 mg/2 mL Pnij: 0 doses of medicine on hand       Specialty medication is an injection or given on a cycle: Yes, Next injection is scheduled for 1.23.26.    REFERRAL TO PHARMACIST     Referral to the pharmacist: Not needed      SHIPPING     Shipping address confirmed in Epic.     Cost and Payment: Patient has a copay of $20. They are aware and have authorized the pharmacy to charge the credit card on file.    Delivery Scheduled: Yes, Expected medication delivery date: 1.21.26.     Medication will be delivered via UPS to the prescription address in Epic WAM.    Doyal Hurst   [facility] Specialty and Home Delivery Pharmacy  Specialty Technician

## 2024-05-07 MED FILL — COSENTYX UNOREADY PEN 300 MG/2 ML SUBCUTANEOUS PEN INJECTOR: SUBCUTANEOUS | 28 days supply | Qty: 2 | Fill #4

## 2024-05-07 MED FILL — OZEMPIC 0.25 MG OR 0.5 MG (2 MG/3 ML) SUBCUTANEOUS PEN INJECTOR: SUBCUTANEOUS | 28 days supply | Qty: 3 | Fill #1
